# Patient Record
Sex: Male | Born: 1960 | Race: Black or African American | Hispanic: No | Marital: Married | Smoking: Former smoker
Health system: Southern US, Community
[De-identification: ages and names within clinical notes are randomized; demographics above are authoritative.]

## PROBLEM LIST (undated history)

## (undated) DIAGNOSIS — T8859XA Other complications of anesthesia, initial encounter: Secondary | ICD-10-CM

## (undated) DIAGNOSIS — T4145XA Adverse effect of unspecified anesthetic, initial encounter: Secondary | ICD-10-CM

## (undated) DIAGNOSIS — E119 Type 2 diabetes mellitus without complications: Secondary | ICD-10-CM

## (undated) HISTORY — PX: TONSILLECTOMY: SUR1361

## (undated) HISTORY — PX: FRACTURE SURGERY: SHX138

## (undated) HISTORY — PX: INGUINAL HERNIA REPAIR: SUR1180

---

## 2008-10-31 ENCOUNTER — Emergency Department (HOSPITAL_COMMUNITY): Admission: EM | Admit: 2008-10-31 | Discharge: 2008-10-31 | Payer: Self-pay | Admitting: Emergency Medicine

## 2010-01-02 ENCOUNTER — Emergency Department (HOSPITAL_COMMUNITY): Admission: EM | Admit: 2010-01-02 | Discharge: 2010-01-02 | Payer: Self-pay | Admitting: Emergency Medicine

## 2013-01-28 ENCOUNTER — Encounter (HOSPITAL_BASED_OUTPATIENT_CLINIC_OR_DEPARTMENT_OTHER): Payer: Self-pay | Admitting: *Deleted

## 2013-01-28 ENCOUNTER — Emergency Department (HOSPITAL_BASED_OUTPATIENT_CLINIC_OR_DEPARTMENT_OTHER)
Admission: EM | Admit: 2013-01-28 | Discharge: 2013-01-28 | Disposition: A | Discharge status: Home / Self Care | Payer: Worker's Compensation | Attending: Emergency Medicine | Admitting: Emergency Medicine

## 2013-01-28 DIAGNOSIS — M109 Gout, unspecified: Secondary | ICD-10-CM | POA: Insufficient documentation

## 2013-01-28 DIAGNOSIS — F17200 Nicotine dependence, unspecified, uncomplicated: Secondary | ICD-10-CM | POA: Insufficient documentation

## 2013-01-28 DIAGNOSIS — H538 Other visual disturbances: Secondary | ICD-10-CM | POA: Insufficient documentation

## 2013-01-28 DIAGNOSIS — R110 Nausea: Secondary | ICD-10-CM | POA: Insufficient documentation

## 2013-01-28 DIAGNOSIS — Z77098 Contact with and (suspected) exposure to other hazardous, chiefly nonmedicinal, chemicals: Secondary | ICD-10-CM
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
## 2013-01-28 NOTE — ED Notes (Signed)
Visual acuity right eye 20/30  left eye 20/30.

## 2016-08-26 ENCOUNTER — Emergency Department (HOSPITAL_COMMUNITY): Payer: BLUE CROSS/BLUE SHIELD

## 2016-08-26 ENCOUNTER — Emergency Department (HOSPITAL_COMMUNITY): Payer: BLUE CROSS/BLUE SHIELD | Admitting: Certified Registered Nurse Anesthetist

## 2016-08-26 ENCOUNTER — Encounter (HOSPITAL_COMMUNITY): Admission: EM | Disposition: A | Discharge status: Rehabilitation Facility | Payer: Self-pay | Source: Home / Self Care

## 2016-08-26 ENCOUNTER — Encounter (HOSPITAL_COMMUNITY): Payer: Self-pay | Admitting: Emergency Medicine

## 2016-08-26 ENCOUNTER — Inpatient Hospital Stay (HOSPITAL_COMMUNITY)
Admission: EM | Admit: 2016-08-26 | Discharge: 2016-09-01 | DRG: 493 | Disposition: A | Discharge status: Rehabilitation Facility | Payer: BLUE CROSS/BLUE SHIELD | Attending: Orthopedic Surgery | Admitting: Orthopedic Surgery

## 2016-08-26 ENCOUNTER — Inpatient Hospital Stay (HOSPITAL_COMMUNITY): Payer: BLUE CROSS/BLUE SHIELD

## 2016-08-26 DIAGNOSIS — Z23 Encounter for immunization: Secondary | ICD-10-CM | POA: Diagnosis not present

## 2016-08-26 DIAGNOSIS — S82891S Other fracture of right lower leg, sequela: Secondary | ICD-10-CM | POA: Diagnosis not present

## 2016-08-26 DIAGNOSIS — F17210 Nicotine dependence, cigarettes, uncomplicated: Secondary | ICD-10-CM | POA: Diagnosis present

## 2016-08-26 DIAGNOSIS — S82891A Other fracture of right lower leg, initial encounter for closed fracture: Secondary | ICD-10-CM | POA: Diagnosis present

## 2016-08-26 DIAGNOSIS — S32049A Unspecified fracture of fourth lumbar vertebra, initial encounter for closed fracture: Secondary | ICD-10-CM | POA: Diagnosis present

## 2016-08-26 DIAGNOSIS — S82852A Displaced trimalleolar fracture of left lower leg, initial encounter for closed fracture: Secondary | ICD-10-CM | POA: Diagnosis present

## 2016-08-26 DIAGNOSIS — M542 Cervicalgia: Secondary | ICD-10-CM | POA: Diagnosis present

## 2016-08-26 DIAGNOSIS — S2249XA Multiple fractures of ribs, unspecified side, initial encounter for closed fracture: Secondary | ICD-10-CM

## 2016-08-26 DIAGNOSIS — S8261XA Displaced fracture of lateral malleolus of right fibula, initial encounter for closed fracture: Secondary | ICD-10-CM | POA: Diagnosis present

## 2016-08-26 DIAGNOSIS — S82892S Other fracture of left lower leg, sequela: Secondary | ICD-10-CM | POA: Diagnosis not present

## 2016-08-26 DIAGNOSIS — Y907 Blood alcohol level of 200-239 mg/100 ml: Secondary | ICD-10-CM | POA: Diagnosis present

## 2016-08-26 DIAGNOSIS — S32039A Unspecified fracture of third lumbar vertebra, initial encounter for closed fracture: Secondary | ICD-10-CM | POA: Diagnosis present

## 2016-08-26 DIAGNOSIS — F10929 Alcohol use, unspecified with intoxication, unspecified: Secondary | ICD-10-CM | POA: Diagnosis present

## 2016-08-26 DIAGNOSIS — S8251XA Displaced fracture of medial malleolus of right tibia, initial encounter for closed fracture: Secondary | ICD-10-CM | POA: Diagnosis present

## 2016-08-26 DIAGNOSIS — F10129 Alcohol abuse with intoxication, unspecified: Secondary | ICD-10-CM | POA: Diagnosis present

## 2016-08-26 DIAGNOSIS — S2243XA Multiple fractures of ribs, bilateral, initial encounter for closed fracture: Secondary | ICD-10-CM | POA: Diagnosis present

## 2016-08-26 DIAGNOSIS — S81811A Laceration without foreign body, right lower leg, initial encounter: Secondary | ICD-10-CM | POA: Diagnosis present

## 2016-08-26 DIAGNOSIS — S32008S Other fracture of unspecified lumbar vertebra, sequela: Secondary | ICD-10-CM | POA: Diagnosis not present

## 2016-08-26 DIAGNOSIS — E119 Type 2 diabetes mellitus without complications: Secondary | ICD-10-CM | POA: Diagnosis present

## 2016-08-26 DIAGNOSIS — S82892A Other fracture of left lower leg, initial encounter for closed fracture: Secondary | ICD-10-CM

## 2016-08-26 DIAGNOSIS — S32009A Unspecified fracture of unspecified lumbar vertebra, initial encounter for closed fracture: Secondary | ICD-10-CM | POA: Diagnosis present

## 2016-08-26 DIAGNOSIS — S2243XS Multiple fractures of ribs, bilateral, sequela: Secondary | ICD-10-CM | POA: Diagnosis not present

## 2016-08-26 DIAGNOSIS — Y92488 Other paved roadways as the place of occurrence of the external cause: Secondary | ICD-10-CM | POA: Diagnosis not present

## 2016-08-26 HISTORY — PX: PERCUTANEOUS PINNING: SHX2209

## 2016-08-26 LAB — CBC
HEMATOCRIT: 44.5 % (ref 39.0–52.0)
Hemoglobin: 16.3 g/dL (ref 13.0–17.0)
MCH: 32.7 pg (ref 26.0–34.0)
MCHC: 36.6 g/dL — AB (ref 30.0–36.0)
MCV: 89.2 fL (ref 78.0–100.0)
Platelets: 268 10*3/uL (ref 150–400)
RBC: 4.99 MIL/uL (ref 4.22–5.81)
RDW: 14.2 % (ref 11.5–15.5)
WBC: 16.3 10*3/uL — AB (ref 4.0–10.5)

## 2016-08-26 LAB — ETHANOL: ALCOHOL ETHYL (B): 219 mg/dL — AB (ref <5)

## 2016-08-26 LAB — BASIC METABOLIC PANEL
Anion gap: 10 (ref 5–15)
BUN: 8 mg/dL (ref 6–20)
CHLORIDE: 107 mmol/L (ref 101–111)
CO2: 25 mmol/L (ref 22–32)
CREATININE: 1.12 mg/dL (ref 0.61–1.24)
Calcium: 8.9 mg/dL (ref 8.9–10.3)
GFR calc Af Amer: >60 mL/min (ref >60)
GFR calc non Af Amer: >60 mL/min (ref >60)
GLUCOSE: 154 mg/dL — AB (ref 65–99)
POTASSIUM: 3.6 mmol/L (ref 3.5–5.1)
Sodium: 142 mmol/L (ref 135–145)

## 2016-08-26 LAB — GLUCOSE, CAPILLARY
Glucose-Capillary: 123 mg/dL — ABNORMAL HIGH (ref 65–99)
Glucose-Capillary: 124 mg/dL — ABNORMAL HIGH (ref 65–99)
Glucose-Capillary: 96 mg/dL (ref 65–99)

## 2016-08-26 SURGERY — PINNING, EXTREMITY, PERCUTANEOUS
Anesthesia: General | Site: Ankle | Laterality: Bilateral

## 2016-08-26 MED ORDER — OXYCODONE HCL 5 MG PO TABS
10.0000 mg | ORAL_TABLET | ORAL | Status: DC | PRN
  Filled 2016-08-26 (×3): qty 2

## 2016-08-26 MED ORDER — HYDROMORPHONE HCL 1 MG/ML IJ SOLN
INTRAMUSCULAR | Status: AC
  Administered 2016-08-26: 0.5 mg via INTRAVENOUS
  Filled 2016-08-26: qty 1

## 2016-08-26 MED ORDER — CEFAZOLIN SODIUM-DEXTROSE 2-4 GM/100ML-% IV SOLN
INTRAVENOUS | Status: AC
  Filled 2016-08-26: qty 100

## 2016-08-26 MED ORDER — LIDOCAINE 2% (20 MG/ML) 5 ML SYRINGE
INTRAMUSCULAR | Status: AC
Start: 2016-08-26 — End: 2016-08-26
  Filled 2016-08-26: qty 5

## 2016-08-26 MED ORDER — OXYCODONE HCL 5 MG PO TABS: 5.0000 mg | ORAL_TABLET | ORAL | Status: DC | PRN

## 2016-08-26 MED ORDER — FENTANYL CITRATE (PF) 100 MCG/2ML IJ SOLN
INTRAMUSCULAR | Status: DC | PRN
Start: 2016-08-26 — End: 2016-08-26
  Administered 2016-08-26 (×4): 50 ug via INTRAVENOUS

## 2016-08-26 MED ORDER — SUCCINYLCHOLINE CHLORIDE 200 MG/10ML IV SOSY
PREFILLED_SYRINGE | INTRAVENOUS | Status: DC | PRN
  Administered 2016-08-26: 160 mg via INTRAVENOUS

## 2016-08-26 MED ORDER — ONDANSETRON HCL 4 MG PO TABS: 4.0000 mg | ORAL_TABLET | Freq: Four times a day (QID) | ORAL | Status: DC | PRN

## 2016-08-26 MED ORDER — FENTANYL CITRATE (PF) 100 MCG/2ML IJ SOLN
INTRAMUSCULAR | Status: AC
  Filled 2016-08-26: qty 4

## 2016-08-26 MED ORDER — LIDOCAINE 2% (20 MG/ML) 5 ML SYRINGE
INTRAMUSCULAR | Status: DC | PRN
Start: 2016-08-26 — End: 2016-08-26
  Administered 2016-08-26: 40 mg via INTRAVENOUS
  Administered 2016-08-26: 60 mg via INTRAVENOUS

## 2016-08-26 MED ORDER — ENOXAPARIN SODIUM 40 MG/0.4ML ~~LOC~~ SOLN: 40.0000 mg | SUBCUTANEOUS | Status: DC

## 2016-08-26 MED ORDER — 0.9 % SODIUM CHLORIDE (POUR BTL) OPTIME
TOPICAL | Status: DC | PRN
  Administered 2016-08-26: 1000 mL

## 2016-08-26 MED ORDER — SUCCINYLCHOLINE CHLORIDE 200 MG/10ML IV SOSY
PREFILLED_SYRINGE | INTRAVENOUS | Status: AC
  Filled 2016-08-26: qty 10

## 2016-08-26 MED ORDER — SODIUM CHLORIDE 0.9 % IV BOLUS (SEPSIS)
500.0000 mL | Freq: Once | INTRAVENOUS | Status: AC
Start: 2016-08-26 — End: 2016-08-26
  Administered 2016-08-26: 500 mL via INTRAVENOUS

## 2016-08-26 MED ORDER — SUGAMMADEX SODIUM 500 MG/5ML IV SOLN
INTRAVENOUS | Status: DC | PRN
  Administered 2016-08-26: 240 mg via INTRAVENOUS

## 2016-08-26 MED ORDER — ONDANSETRON HCL 4 MG/2ML IJ SOLN: 4.0000 mg | Freq: Four times a day (QID) | INTRAMUSCULAR | Status: DC | PRN

## 2016-08-26 MED ORDER — PHENYLEPHRINE 40 MCG/ML (10ML) SYRINGE FOR IV PUSH (FOR BLOOD PRESSURE SUPPORT)
PREFILLED_SYRINGE | INTRAVENOUS | Status: DC | PRN
Start: 2016-08-26 — End: 2016-08-26
  Administered 2016-08-26: 80 ug via INTRAVENOUS
  Administered 2016-08-26: 120 ug via INTRAVENOUS
  Administered 2016-08-26: 80 ug via INTRAVENOUS

## 2016-08-26 MED ORDER — POTASSIUM CHLORIDE IN NACL 20-0.9 MEQ/L-% IV SOLN
INTRAVENOUS | Status: DC
  Administered 2016-08-27: 05:00:00 via INTRAVENOUS
  Filled 2016-08-26 (×2): qty 1000

## 2016-08-26 MED ORDER — ENOXAPARIN SODIUM 40 MG/0.4ML ~~LOC~~ SOLN
40.0000 mg | SUBCUTANEOUS | Status: DC
  Administered 2016-08-27: 40 mg via SUBCUTANEOUS
  Filled 2016-08-26: qty 0.4

## 2016-08-26 MED ORDER — CEFAZOLIN SODIUM 1 G IJ SOLR
INTRAMUSCULAR | Status: AC
  Filled 2016-08-26: qty 10

## 2016-08-26 MED ORDER — PROPOFOL 10 MG/ML IV BOLUS
INTRAVENOUS | Status: DC | PRN
  Administered 2016-08-26: 180 mg via INTRAVENOUS

## 2016-08-26 MED ORDER — DEXTROSE 5 % IV SOLN
INTRAVENOUS | Status: DC | PRN
  Administered 2016-08-26: 25 ug/min via INTRAVENOUS

## 2016-08-26 MED ORDER — LACTATED RINGERS IV SOLN
INTRAVENOUS | Status: DC | PRN
  Administered 2016-08-26 (×2): via INTRAVENOUS

## 2016-08-26 MED ORDER — PROMETHAZINE HCL 25 MG/ML IJ SOLN: 6.2500 mg | INTRAMUSCULAR | Status: DC | PRN

## 2016-08-26 MED ORDER — CEFAZOLIN SODIUM 1 G IJ SOLR
INTRAMUSCULAR | Status: DC | PRN
  Administered 2016-08-26: 1 g via INTRAMUSCULAR

## 2016-08-26 MED ORDER — TETANUS-DIPHTHERIA TOXOIDS TD 5-2 LFU IM INJ
0.5000 mL | INJECTION | Freq: Once | INTRAMUSCULAR | Status: AC
  Administered 2016-08-26: 0.5 mL via INTRAMUSCULAR
  Filled 2016-08-26: qty 0.5

## 2016-08-26 MED ORDER — CEFAZOLIN SODIUM-DEXTROSE 2-4 GM/100ML-% IV SOLN
2.0000 g | INTRAVENOUS | Status: AC
  Administered 2016-08-26: 2 g via INTRAVENOUS

## 2016-08-26 MED ORDER — LIDOCAINE HCL 2 % IJ SOLN
5.0000 mL | Freq: Once | INTRAMUSCULAR | Status: AC
  Administered 2016-08-26: 100 mg via INTRADERMAL
  Filled 2016-08-26: qty 20

## 2016-08-26 MED ORDER — ESMOLOL HCL 100 MG/10ML IV SOLN
INTRAVENOUS | Status: DC | PRN
  Administered 2016-08-26: 30 mg via INTRAVENOUS

## 2016-08-26 MED ORDER — KETOROLAC TROMETHAMINE 30 MG/ML IJ SOLN
INTRAMUSCULAR | Status: AC
  Filled 2016-08-26: qty 1

## 2016-08-26 MED ORDER — CHLORHEXIDINE GLUCONATE 4 % EX LIQD
60.0000 mL | Freq: Once | CUTANEOUS | Status: DC
  Filled 2016-08-26: qty 60

## 2016-08-26 MED ORDER — PROPOFOL 10 MG/ML IV BOLUS
INTRAVENOUS | Status: AC
  Filled 2016-08-26: qty 20

## 2016-08-26 MED ORDER — INSULIN ASPART 100 UNIT/ML ~~LOC~~ SOLN
0.0000 [IU] | Freq: Three times a day (TID) | SUBCUTANEOUS | Status: DC
  Administered 2016-08-26 – 2016-08-27 (×4): 2 [IU] via SUBCUTANEOUS
  Administered 2016-08-28 – 2016-08-30 (×8): 3 [IU] via SUBCUTANEOUS
  Administered 2016-08-30: 5 [IU] via SUBCUTANEOUS
  Administered 2016-08-31 (×2): 3 [IU] via SUBCUTANEOUS
  Administered 2016-08-31 – 2016-09-01 (×3): 2 [IU] via SUBCUTANEOUS

## 2016-08-26 MED ORDER — ROCURONIUM BROMIDE 10 MG/ML (PF) SYRINGE
PREFILLED_SYRINGE | INTRAVENOUS | Status: DC | PRN
Start: 2016-08-26 — End: 2016-08-26
  Administered 2016-08-26: 40 mg via INTRAVENOUS
  Administered 2016-08-26: 20 mg via INTRAVENOUS

## 2016-08-26 MED ORDER — SUGAMMADEX SODIUM 500 MG/5ML IV SOLN
INTRAVENOUS | Status: AC
  Filled 2016-08-26: qty 5

## 2016-08-26 MED ORDER — EPHEDRINE SULFATE-NACL 50-0.9 MG/10ML-% IV SOSY
PREFILLED_SYRINGE | INTRAVENOUS | Status: DC | PRN
  Administered 2016-08-26: 10 mg via INTRAVENOUS

## 2016-08-26 MED ORDER — HYDROMORPHONE HCL 1 MG/ML IJ SOLN
0.2500 mg | INTRAMUSCULAR | Status: DC | PRN
  Administered 2016-08-26 (×2): 0.5 mg via INTRAVENOUS

## 2016-08-26 MED ORDER — ONDANSETRON HCL 4 MG/2ML IJ SOLN
INTRAMUSCULAR | Status: DC | PRN
  Administered 2016-08-26: 4 mg via INTRAVENOUS

## 2016-08-26 MED ORDER — ONDANSETRON HCL 4 MG/2ML IJ SOLN
INTRAMUSCULAR | Status: AC
  Filled 2016-08-26: qty 2

## 2016-08-26 MED ORDER — KETOROLAC TROMETHAMINE 30 MG/ML IJ SOLN
30.0000 mg | Freq: Once | INTRAMUSCULAR | Status: AC | PRN
  Administered 2016-08-26: 30 mg via INTRAVENOUS

## 2016-08-26 MED ORDER — MIDAZOLAM HCL 2 MG/2ML IJ SOLN
INTRAMUSCULAR | Status: AC
  Filled 2016-08-26: qty 2

## 2016-08-26 MED ORDER — HYDROMORPHONE HCL 1 MG/ML IJ SOLN
1.0000 mg | INTRAMUSCULAR | Status: DC | PRN
  Administered 2016-08-26 – 2016-08-28 (×6): 1 mg via INTRAVENOUS
  Filled 2016-08-26 (×6): qty 1

## 2016-08-26 MED ORDER — IOPAMIDOL (ISOVUE-300) INJECTION 61%
INTRAVENOUS | Status: AC
  Administered 2016-08-26: 100 mL
  Filled 2016-08-26: qty 100

## 2016-08-26 MED ORDER — POVIDONE-IODINE 10 % EX SWAB: 2.0000 "application " | Freq: Once | CUTANEOUS | Status: DC

## 2016-08-26 SURGICAL SUPPLY — 62 items
BANDAGE ACE 4X5 VEL STRL LF (GAUZE/BANDAGES/DRESSINGS) IMPLANT
BANDAGE ELASTIC 3 VELCRO ST LF (GAUZE/BANDAGES/DRESSINGS) IMPLANT
BAR GLASS FIBER EXFX 11X150 (EXFIX) ×3 IMPLANT
BAR GLASS FIBER EXFX 11X200 (EXFIX) ×3 IMPLANT
BAR GLASS FIBER EXFX 11X350 (EXFIX) ×6 IMPLANT
BENZOIN TINCTURE PRP APPL 2/3 (GAUZE/BANDAGES/DRESSINGS) IMPLANT
BIT DRILL 2.7XCANN QCK CNCT (BIT) ×1 IMPLANT
BIT DRILL CANN 2.7 (BIT) ×1
BIT DRILL CANN 2.7MM (BIT) ×1
BIT DRL 2.7XCANN QCK CNCT (BIT) ×1
BLADE SURG ROTATE 9660 (MISCELLANEOUS) IMPLANT
BNDG GAUZE ELAST 4 BULKY (GAUZE/BANDAGES/DRESSINGS) IMPLANT
CLAMP BLUE BAR TO BAR (EXFIX) ×6 IMPLANT
CLAMP BLUE BAR TO PIN (EXFIX) ×12 IMPLANT
CLOSURE WOUND 1/2 X4 (GAUZE/BANDAGES/DRESSINGS)
COVER SURGICAL LIGHT HANDLE (MISCELLANEOUS) ×3 IMPLANT
CUFF TOURNIQUET SINGLE 18IN (TOURNIQUET CUFF) IMPLANT
CUFF TOURNIQUET SINGLE 24IN (TOURNIQUET CUFF) IMPLANT
DRAPE BILATERAL SPLIT (DRAPES) ×3 IMPLANT
DRAPE ORTHO SPLIT 87X125 STRL (DRAPES) ×9 IMPLANT
DRSG EMULSION OIL 3X3 NADH (GAUZE/BANDAGES/DRESSINGS) IMPLANT
DURAPREP 26ML APPLICATOR (WOUND CARE) ×6 IMPLANT
GAUZE SPONGE 4X4 12PLY STRL (GAUZE/BANDAGES/DRESSINGS) ×3 IMPLANT
GAUZE XEROFORM 1X8 LF (GAUZE/BANDAGES/DRESSINGS) IMPLANT
GAUZE XEROFORM 5X9 LF (GAUZE/BANDAGES/DRESSINGS) ×3 IMPLANT
GLOVE BIOGEL PI IND STRL 7.5 (GLOVE) IMPLANT
GLOVE BIOGEL PI IND STRL 8.5 (GLOVE) ×2 IMPLANT
GLOVE BIOGEL PI INDICATOR 7.5 (GLOVE)
GLOVE BIOGEL PI INDICATOR 8.5 (GLOVE) ×4
GLOVE SURG ORTHO 7.0 STRL STRW (GLOVE) IMPLANT
GLOVE SURG ORTHO 8.0 STRL STRW (GLOVE) ×9 IMPLANT
GOWN STRL REUS W/ TWL LRG LVL3 (GOWN DISPOSABLE) ×1 IMPLANT
GOWN STRL REUS W/TWL 2XL LVL3 (GOWN DISPOSABLE) ×6 IMPLANT
GOWN STRL REUS W/TWL LRG LVL3 (GOWN DISPOSABLE) ×2
HALF PIN 3MM (EXFIX) ×3 IMPLANT
HALF PIN 4MM (EXFIX) ×3 IMPLANT
K-WIRE PT THD 1.6X150 (WIRE) ×6
KIT BASIN OR (CUSTOM PROCEDURE TRAY) ×3 IMPLANT
KIT ROOM TURNOVER OR (KITS) ×3 IMPLANT
KWIRE PT THD 1.6X150 (WIRE) ×2 IMPLANT
MANIFOLD NEPTUNE II (INSTRUMENTS) ×3 IMPLANT
NS IRRIG 1000ML POUR BTL (IV SOLUTION) ×3 IMPLANT
PACK ORTHO EXTREMITY (CUSTOM PROCEDURE TRAY) ×3 IMPLANT
PAD ABD 8X10 STRL (GAUZE/BANDAGES/DRESSINGS) ×18 IMPLANT
PAD ARMBOARD 7.5X6 YLW CONV (MISCELLANEOUS) ×9 IMPLANT
PAD CAST 4YDX4 CTTN HI CHSV (CAST SUPPLIES) ×2 IMPLANT
PADDING CAST COTTON 4X4 STRL (CAST SUPPLIES) ×4
PADDING CAST COTTON 6X4 STRL (CAST SUPPLIES) ×6 IMPLANT
PIN CLAMP 2BAR 75MM BLUE (EXFIX) ×3 IMPLANT
PIN HALF YELLOW 5X160X35 (EXFIX) ×6 IMPLANT
PIN TRANSFIXING 5.0 (EXFIX) ×3 IMPLANT
SCREW CANN .5 THRD RVRS CUT (Screw) ×2 IMPLANT
SCREW CANNULATED 4.0X44MM (Screw) ×4 IMPLANT
STRIP CLOSURE SKIN 1/2X4 (GAUZE/BANDAGES/DRESSINGS) IMPLANT
SUT ETHILON 3 0 PS 1 (SUTURE) ×3 IMPLANT
SUT ETHILON 4 0 P 3 18 (SUTURE) IMPLANT
SUT ETHILON 5 0 P 3 18 (SUTURE)
SUT NYLON ETHILON 5-0 P-3 1X18 (SUTURE) IMPLANT
SUT PROLENE 4 0 P 3 18 (SUTURE) IMPLANT
TOWEL OR 17X24 6PK STRL BLUE (TOWEL DISPOSABLE) ×3 IMPLANT
TOWEL OR 17X26 10 PK STRL BLUE (TOWEL DISPOSABLE) ×3 IMPLANT
WATER STERILE IRR 1000ML POUR (IV SOLUTION) ×3 IMPLANT
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
## 2016-08-26 NOTE — ED Notes (Signed)
Patient transported to CT 
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
## 2016-08-26 NOTE — ED Notes (Signed)
Pt states he does not have any pain in head.
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
## 2016-08-26 NOTE — Progress Notes (Signed)
Orthopedic Tech Progress Note Patient Details:  [name] DiceRobert [name] 1961/02/16 [id number]  Ortho Devices Type of Ortho Device: Ace wrap, Post (short leg) splint, Stirrup splint Ortho Device/Splint Location: lle/rle Ortho Device/Splint Interventions: Application   [name] [name] 08/26/2016, 8:31 AM

## 2016-08-26 NOTE — ED Notes (Signed)
Made two attempts at IV access, without success. Will place order for IV team d/t difficult start.

## 2016-08-26 NOTE — ED Notes (Signed)
EDP at bedside  

## 2016-08-26 NOTE — Anesthesia Postprocedure Evaluation (Signed)
Anesthesia Post Note  Patient: [name] DiceRobert [name]  Procedure(s) Performed: Procedure(s) (LRB): LEFT ANKLE EXTERNAL FIXATION / RIGHT ANKLE PERCUTANEOUS SCREW FIXATION (Bilateral)  Patient location during evaluation: PACU Anesthesia Type: General Level of consciousness: awake and alert Pain management: pain level controlled Vital Signs Assessment: post-procedure vital signs reviewed and stable Respiratory status: spontaneous breathing, nonlabored ventilation, respiratory function stable and patient connected to nasal cannula oxygen Cardiovascular status: blood pressure returned to baseline and stable Postop Assessment: no signs of nausea or vomiting Anesthetic complications: no    Last Vitals:  Vitals:   08/26/16 1232 08/26/16 1300  BP: (!) 95/58 (!) (P) 104/57  Pulse: (!) 111   Resp: (!) 23   Temp: 36.6 C     Last Pain:  Vitals:   08/26/16 1020  PainSc: 10-Worst pain ever                 [name] [name] S

## 2016-08-26 NOTE — ED Notes (Signed)
IV Team at bedside 

## 2016-08-26 NOTE — ED Notes (Signed)
Ortho paged. 

## 2016-08-26 NOTE — Progress Notes (Signed)
Orthopedic Tech Progress Note Patient Details:  [name] DiceRobert [name] 1961-01-16 [id number]  Ortho Devices Type of Ortho Device: CAM walker Ortho Device/Splint Location: lle/rle Ortho Device/Splint Interventions: Ordered As ordered by Dr. Domenic MorasNanavati  [name] [name] 08/26/2016, 12:28 PM
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
## 2016-08-26 NOTE — Transfer of Care (Signed)
Immediate Anesthesia Transfer of Care Note  Patient: [name] DiceRobert Bega  Procedure(s) Performed: Procedure(s): LEFT ANKLE EXTERNAL FIXATION / RIGHT ANKLE PERCUTANEOUS SCREW FIXATION (Bilateral)  Patient Location: PACU  Anesthesia Type:General  Level of Consciousness: awake, alert , oriented, patient cooperative and responds to stimulation  Airway & Oxygen Therapy: Patient spontaneously breathing connected to face mask oxygen  Post-op Assessment: Report given to RN and Post -op Vital signs reviewed and stable  Post vital signs: Reviewed and stable  Last Vitals:  Vitals:   08/26/16 0415 08/26/16 1009  BP: 107/64 131/74  Pulse: 108 100  Resp:  (!) 2    Last Pain:  Vitals:   08/26/16 1020  PainSc: 10-Worst pain ever         Complications: No apparent anesthesia complications
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
## 2016-08-26 NOTE — Progress Notes (Signed)
Dr. Okey Dupreose updated on PACU stay. Cleared to tf to room 5N20

## 2016-08-26 NOTE — Progress Notes (Signed)
Orthopedic Tech Progress Note Patient Details:  [name] DiceRobert Lim 02-Aug-1961 [id number]  Patient ID: [name] Diceobert Griess, male   DOB: 02-Aug-1961, 55 y.o.   MRN: [id number]   [name], [name] 08/26/2016, 8:31 AM Crutch order to be cancelled

## 2016-08-26 NOTE — Anesthesia Procedure Notes (Signed)
Procedure Name: Intubation Date/Time: 08/26/2016 10:57 AM Performed by: Annabelle HarmanSMITH, Kilah Drahos A Pre-anesthesia Checklist: Patient identified, Emergency Drugs available, Suction available and Patient being monitored Patient Re-evaluated:Patient Re-evaluated prior to inductionOxygen Delivery Method: Circle system utilized Preoxygenation: Pre-oxygenation with 100% oxygen Intubation Type: IV induction Ventilation: Oral airway inserted - appropriate to patient size, Mask ventilation with difficulty and Two handed mask ventilation required Laryngoscope Size: Glidescope and 4 Grade View: Grade I Tube type: Oral Tube size: 7.5 mm Number of attempts: 1 Airway Equipment and Method: Video-laryngoscopy and Rigid stylet Placement Confirmation: ETT inserted through vocal cords under direct vision,  positive ETCO2 and breath sounds checked- equal and bilateral Secured at: 24 cm Tube secured with: Tape Dental Injury: Teeth and Oropharynx as per pre-operative assessment  Difficulty Due To: Difficulty was anticipated, Difficult Airway- due to large tongue, Difficult Airway- due to reduced neck mobility and Difficult Airway- due to cervical collar Comments: C-spine cleared per CT scan.  Head and neck remained in neutral alignment throughout.  Glidescope 4 used.  Grade 1 view.  EBBS and VSS.

## 2016-08-26 NOTE — Anesthesia Preprocedure Evaluation (Signed)
Anesthesia Evaluation  Patient identified by MRN, date of birth, ID band Patient awake    Reviewed: Allergy & Precautions, NPO status , Patient's Chart, lab work & pertinent test results  Airway Mallampati: II  TM Distance: >3 FB Neck ROM: Full    Dental no notable dental hx.    Pulmonary neg pulmonary ROS, Current Smoker,    Pulmonary exam normal breath sounds clear to auscultation       Cardiovascular negative cardio ROS Normal cardiovascular exam Rhythm:Regular Rate:Normal     Neuro/Psych negative neurological ROS  negative psych ROS   GI/Hepatic negative GI ROS, Neg liver ROS,   Endo/Other  diabetesMorbid obesity  Renal/GU negative Renal ROS  negative genitourinary   Musculoskeletal negative musculoskeletal ROS (+)   Abdominal   Peds negative pediatric ROS (+)  Hematology negative hematology ROS (+)   Anesthesia Other Findings   Reproductive/Obstetrics negative OB ROS                             Anesthesia Physical Anesthesia Plan  ASA: III  Anesthesia Plan: General   Post-op Pain Management:    Induction: Intravenous  Airway Management Planned: LMA and Oral ETT  Additional Equipment:   Intra-op Plan:   Post-operative Plan: Extubation in OR  Informed Consent: I have reviewed the patients History and Physical, chart, labs and discussed the procedure including the risks, benefits and alternatives for the proposed anesthesia with the patient or authorized representative who has indicated his/her understanding and acceptance.   Dental advisory given  Plan Discussed with: CRNA and Surgeon  Anesthesia Plan Comments:         Anesthesia Quick Evaluation

## 2016-08-26 NOTE — Op Note (Signed)
Dictation Number:  [id number]

## 2016-08-27 LAB — HEMOGLOBIN A1C
Hgb A1c MFr Bld: 6.3 % — ABNORMAL HIGH (ref 4.8–5.6)
Mean Plasma Glucose: 134 mg/dL

## 2016-08-27 LAB — COMPREHENSIVE METABOLIC PANEL
ALBUMIN: 3.1 g/dL — AB (ref 3.5–5.0)
ALK PHOS: 48 U/L (ref 38–126)
ALT: 29 U/L (ref 17–63)
ANION GAP: 5 (ref 5–15)
AST: 49 U/L — ABNORMAL HIGH (ref 15–41)
BUN: 6 mg/dL (ref 6–20)
CALCIUM: 8.1 mg/dL — AB (ref 8.9–10.3)
CHLORIDE: 108 mmol/L (ref 101–111)
CO2: 26 mmol/L (ref 22–32)
Creatinine, Ser: 0.75 mg/dL (ref 0.61–1.24)
GFR calc Af Amer: >60 mL/min (ref >60)
GFR calc non Af Amer: >60 mL/min (ref >60)
GLUCOSE: 127 mg/dL — AB (ref 65–99)
POTASSIUM: 4.3 mmol/L (ref 3.5–5.1)
Sodium: 139 mmol/L (ref 135–145)
Total Bilirubin: 1.1 mg/dL (ref 0.3–1.2)
Total Protein: 5.9 g/dL — ABNORMAL LOW (ref 6.5–8.1)

## 2016-08-27 LAB — GLUCOSE, CAPILLARY
GLUCOSE-CAPILLARY: 144 mg/dL — AB (ref 65–99)
GLUCOSE-CAPILLARY: 123 mg/dL — AB (ref 65–99)
GLUCOSE-CAPILLARY: 148 mg/dL — AB (ref 65–99)
Glucose-Capillary: 139 mg/dL — ABNORMAL HIGH (ref 65–99)

## 2016-08-27 LAB — CBC
HEMATOCRIT: 38.5 % — AB (ref 39.0–52.0)
HEMOGLOBIN: 13.8 g/dL (ref 13.0–17.0)
MCH: 31.7 pg (ref 26.0–34.0)
MCHC: 35.8 g/dL (ref 30.0–36.0)
MCV: 88.3 fL (ref 78.0–100.0)
Platelets: 221 10*3/uL (ref 150–400)
RBC: 4.36 MIL/uL (ref 4.22–5.81)
RDW: 14.1 % (ref 11.5–15.5)
WBC: 15.6 10*3/uL — AB (ref 4.0–10.5)

## 2016-08-27 MED ORDER — SODIUM CHLORIDE 0.9% FLUSH: 10.0000 mL | INTRAVENOUS | Status: DC | PRN

## 2016-08-27 NOTE — Progress Notes (Addendum)
SPORTS MEDICINE AND JOINT REPLACEMENT  Georgena SpurlingStephen Lucey, MD    Laurier Nancyolby Odalys Win, PA-C [address], [place], [place]  [id number]                             [phone]   PROGRESS NOTE  Subjective:  negative for Chest Pain  negative for Shortness of Breath  negative for Nausea/Vomiting   negative for Calf Pain  negative for Bowel Movement   Tolerating Diet: yes         Patient reports pain as 5 on 0-10 scale.    Objective: Vital signs in last 24 hours:   Patient Vitals for the past 24 hrs:  BP Temp Temp src Pulse Resp SpO2  08/27/16 0449 (!) 142/66 98.8 F (37.1 C) Oral (!) 104 16 96 %  08/27/16 0028 (!) 127/58 99.2 F (37.3 C) Oral 100 16 100 %  08/26/16 2038 (!) 123/55 99.6 F (37.6 C) Oral 98 18 100 %  08/26/16 1400 (!) 104/53 99 F (37.2 C) - (!) 111 (!) 22 97 %  08/26/16 1315 (!) 110/52 98.1 F (36.7 C) - - - -  08/26/16 1300 (!) 104/57 - - - - -  08/26/16 1232 (!) 95/58 97.9 F (36.6 C) - (!) 111 (!) 23 100 %  08/26/16 1009 131/74 - - 100 (!) 2 95 %    @flow {1959:LAST@   Intake/Output from previous day:   09/16 0701 - 09/17 0700 In: 2470 [P.O.:120; I.V.:2350] Out: 1320 [Urine:1310]   Intake/Output this shift:   No intake/output data recorded.   Intake/Output      09/16 0701 - 09/17 0700 09/17 0701 - 09/18 0700   P.O. 120    I.V. 2350    Total Intake 2470     Urine 1310    Blood 10    Total Output 1320     Net +1150             LABORATORY DATA:  Recent Labs  08/26/16 0145 08/27/16 0308  WBC 16.3* 15.6*  HGB 16.3 13.8  HCT 44.5 38.5*  PLT 268 221    Recent Labs  08/26/16 0145 08/27/16 0308  NA 142 139  K 3.6 4.3  CL 107 108  CO2 25 26  BUN 8 6  CREATININE 1.12 0.75  GLUCOSE 154* 127*  CALCIUM 8.9 8.1*   No results found for: INR, PROTIME  Examination:  General appearance: alert, cooperative and no distress Extremities: extremities normal, atraumatic, no cyanosis or edema  Wound Exam: clean, dry, intact   Drainage:   None: wound tissue dry  Motor Exam: Quadriceps and Hamstrings Intact  Sensory Exam: Superficial Peroneal, Deep Peroneal and Tibial normal   Assessment:    1 Day Post-Op  Procedure(s) (LRB): LEFT ANKLE EXTERNAL FIXATION / RIGHT ANKLE PERCUTANEOUS SCREW FIXATION (Bilateral)  ADDITIONAL DIAGNOSIS:  Active Problems:   Bilateral ankle fractures    Plan: Physical Therapy as ordered Non Weight Bearing (NWB)  DVT Prophylaxis:  Lovenox  DISCHARGE PLAN: Home  Patient is resting comfortably 1 day post op. Dressings were checked and are in tact. Trauma service to continue to follow and determine D/C.         Guy SandiferColby Alan Keni Wafer 08/27/2016, 8:39 AM

## 2016-08-27 NOTE — Plan of Care (Signed)
Problem: Tissue Perfusion: Goal: Risk factors for ineffective tissue perfusion will decrease Outcome: Progressing No s/s of dvt  Problem: Bowel/Gastric: Goal: Will not experience complications related to bowel motility Outcome: Progressing No bowel issues

## 2016-08-27 NOTE — Progress Notes (Signed)
**Note [name]-Identified via Obfuscation** Trauma Service Note  Subjective: Pain in both legs, minimal chest pain, no neck pain, hungry  Objective: Vital signs in last 24 hours: Temp:  [97.9 F (36.6 C)-99.6 F (37.6 C)] 98.8 F (37.1 C) (09/17 0449) Pulse Rate:  [98-111] 104 (09/17 0449) Resp:  [2-23] 16 (09/17 0449) BP: (95-142)/(52-74) 142/66 (09/17 0449) SpO2:  [95 %-100 %] 96 % (09/17 0449) Last BM Date: 08/25/16  Intake/Output from previous day: 09/16 0701 - 09/17 0700 In: 2470 [P.O.:120; I.V.:2350] Out: 1320 [Urine:1310; Blood:10] Intake/Output this shift: Total I/O In: 240 [P.O.:240] Out: -   General: NAD  Lungs: CTAB, IS  Abd: soft, NT, ND, with obesity  Extremities: left calv in ex fix, right calf in ice packs  Neuro: AOx4, c spine clinically cleared and collar removed.  Lab Results: CBC   Recent Labs  08/26/16 0145 08/27/16 0308  WBC 16.3* 15.6*  HGB 16.3 13.8  HCT 44.5 38.5*  PLT 268 221   BMET  Recent Labs  08/26/16 0145 08/27/16 0308  NA 142 139  K 3.6 4.3  CL 107 108  CO2 25 26  GLUCOSE 154* 127*  BUN 8 6  CREATININE 1.12 0.75  CALCIUM 8.9 8.1*   PT/INR No results for input(s): LABPROT, INR in the last 72 hours. ABG No results for input(s): PHART, HCO3 in the last 72 hours.  Invalid input(s): PCO2, PO2  Studies/Results: Dg Ankle 2 Views Left  Result Date: 08/26/2016 CLINICAL DATA:  External fixation of left ankle. EXAM: LEFT ANKLE - 2 VIEW COMPARISON:  Films earlier today. FINDINGS: Intraoperative imaging with a C-arm demonstrates severely comminuted distal tibial and fibular fractures. After placement of external fixation device alignment appears improved. IMPRESSION: Improved alignment of the comminuted left ankle fractures after placement of external fixation device. Electronically Signed   By: Irish Lack M.D.   On: 08/26/2016 14:08   Dg Ankle 2 Views Right  Result Date: 08/26/2016 CLINICAL DATA:  ORIF of right ankle fracture EXAM: DG C-ARM 61-120 MIN;  RIGHT ANKLE - 2 VIEW COMPARISON:  Ankle films earlier today. FINDINGS: Intraoperative images show screw placement across the medial malleolar fracture with no significant displacement identified. The ankle mortise shows normal alignment. IMPRESSION: Status post ORIF with screw placement across the medial malleolar fracture. Electronically Signed   By: Irish Lack M.D.   On: 08/26/2016 14:05   Dg C-arm 1-60 Min  Result Date: 08/26/2016 CLINICAL DATA:  ORIF of right ankle fracture EXAM: DG C-ARM 61-120 MIN; RIGHT ANKLE - 2 VIEW COMPARISON:  Ankle films earlier today. FINDINGS: Intraoperative images show screw placement across the medial malleolar fracture with no significant displacement identified. The ankle mortise shows normal alignment. IMPRESSION: Status post ORIF with screw placement across the medial malleolar fracture. Electronically Signed   By: Irish Lack M.D.   On: 08/26/2016 14:05    Anti-infectives: Anti-infectives    Start     Dose/Rate Route Frequency Ordered Stop   08/26/16 1030  ceFAZolin (ANCEF) IVPB 2g/100 mL premix     2 g 200 mL/hr over 30 Minutes Intravenous On call to O.R. 08/26/16 1019 08/26/16 1137   08/26/16 1024  ceFAZolin (ANCEF) 2-4 GM/100ML-% IVPB    Comments:  Shireen Quan   : cabinet override      08/26/16 1024 08/26/16 1107      Medications Scheduled Meds: . enoxaparin (LOVENOX) injection  40 mg Subcutaneous Q24H  . insulin aspart  0-15 Units Subcutaneous TID WC   Continuous Infusions: . 0.9 %  NaCl with KCl 20 mEq / L 100 mL/hr at 08/27/16 0526   PRN Meds:.HYDROmorphone (DILAUDID) injection, ondansetron **OR** ondansetron (ZOFRAN) IV, oxyCODONE, oxyCODONE  Assessment/Plan: s/p Procedure(s): LEFT ANKLE EXTERNAL FIXATION / RIGHT ANKLE PERCUTANEOUS SCREW FIXATION -advance to reg diet -c spine cleared clinically after neg CT and resolution of cervicalgia -continue IS/pulm toilet for rib fx -f/u ortho recs   LOS: 1 day   [name] [name]  [facility] Trauma Surgeon [phone]--office [facility] 08/27/2016

## 2016-08-28 ENCOUNTER — Encounter (HOSPITAL_COMMUNITY): Payer: Self-pay

## 2016-08-28 DIAGNOSIS — S2243XA Multiple fractures of ribs, bilateral, initial encounter for closed fracture: Secondary | ICD-10-CM | POA: Diagnosis present

## 2016-08-28 DIAGNOSIS — S32009A Unspecified fracture of unspecified lumbar vertebra, initial encounter for closed fracture: Secondary | ICD-10-CM | POA: Diagnosis present

## 2016-08-28 DIAGNOSIS — E119 Type 2 diabetes mellitus without complications: Secondary | ICD-10-CM

## 2016-08-28 DIAGNOSIS — F10929 Alcohol use, unspecified with intoxication, unspecified: Secondary | ICD-10-CM | POA: Diagnosis present

## 2016-08-28 LAB — GLUCOSE, CAPILLARY
GLUCOSE-CAPILLARY: 157 mg/dL — AB (ref 65–99)
Glucose-Capillary: 159 mg/dL — ABNORMAL HIGH (ref 65–99)
Glucose-Capillary: 155 mg/dL — ABNORMAL HIGH (ref 65–99)
Glucose-Capillary: 160 mg/dL — ABNORMAL HIGH (ref 65–99)

## 2016-08-28 MED ORDER — ENOXAPARIN SODIUM 40 MG/0.4ML ~~LOC~~ SOLN
40.0000 mg | Freq: Two times a day (BID) | SUBCUTANEOUS | Status: DC
  Administered 2016-08-28 – 2016-09-01 (×9): 40 mg via SUBCUTANEOUS
  Filled 2016-08-28 (×9): qty 0.4

## 2016-08-28 MED ORDER — OXYCODONE HCL 5 MG PO TABS
5.0000 mg | ORAL_TABLET | ORAL | Status: DC | PRN
  Administered 2016-08-28 – 2016-08-29 (×3): 10 mg via ORAL
  Administered 2016-08-29 (×2): 15 mg via ORAL
  Administered 2016-08-30: 10 mg via ORAL
  Administered 2016-08-30 – 2016-09-01 (×8): 15 mg via ORAL
  Filled 2016-08-28 (×3): qty 3
  Filled 2016-08-28: qty 2
  Filled 2016-08-28: qty 3
  Filled 2016-08-28: qty 2
  Filled 2016-08-28 (×7): qty 3
  Filled 2016-08-28 (×3): qty 2

## 2016-08-28 MED ORDER — DOCUSATE SODIUM 100 MG PO CAPS
100.0000 mg | ORAL_CAPSULE | Freq: Two times a day (BID) | ORAL | Status: DC
  Administered 2016-08-28 – 2016-09-01 (×9): 100 mg via ORAL
  Filled 2016-08-28 (×7): qty 1

## 2016-08-28 MED ORDER — HYDROMORPHONE HCL 1 MG/ML IJ SOLN
0.5000 mg | INTRAMUSCULAR | Status: DC | PRN
  Administered 2016-08-28 – 2016-08-30 (×3): 0.5 mg via INTRAVENOUS
  Filled 2016-08-28 (×4): qty 1

## 2016-08-28 MED ORDER — POLYETHYLENE GLYCOL 3350 17 G PO PACK
17.0000 g | PACK | Freq: Every day | ORAL | Status: DC
  Administered 2016-08-28 – 2016-09-01 (×5): 17 g via ORAL
  Filled 2016-08-28 (×5): qty 1
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
## 2016-08-29 LAB — GLUCOSE, CAPILLARY
GLUCOSE-CAPILLARY: 183 mg/dL — AB (ref 65–99)
Glucose-Capillary: 200 mg/dL — ABNORMAL HIGH (ref 65–99)
Glucose-Capillary: 173 mg/dL — ABNORMAL HIGH (ref 65–99)
Glucose-Capillary: 184 mg/dL — ABNORMAL HIGH (ref 65–99)
Glucose-Capillary: 187 mg/dL — ABNORMAL HIGH (ref 65–99)

## 2016-08-29 MED ORDER — GUAIFENESIN ER 600 MG PO TB12
1200.0000 mg | ORAL_TABLET | Freq: Two times a day (BID) | ORAL | Status: DC
  Administered 2016-08-29 – 2016-09-01 (×7): 1200 mg via ORAL
  Filled 2016-08-29 (×8): qty 2
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
## 2016-08-29 NOTE — Progress Notes (Signed)
Rehab Admissions Coordinator Note:  Patient was screened by Clois DupesBoyette, Haelie Clapp Godwin for appropriateness for an Inpatient Acute Rehab Consult per PT recommendation.   At this time, we are recommending Inpatient Rehab consult.  Clois DupesBoyette, Jakhia Buxton Godwin 08/29/2016, 6:00 PM  I can be reached at [phone].

## 2016-08-30 DIAGNOSIS — S32008A Other fracture of unspecified lumbar vertebra, initial encounter for closed fracture: Secondary | ICD-10-CM

## 2016-08-30 DIAGNOSIS — S82891A Other fracture of right lower leg, initial encounter for closed fracture: Secondary | ICD-10-CM

## 2016-08-30 DIAGNOSIS — S82892A Other fracture of left lower leg, initial encounter for closed fracture: Secondary | ICD-10-CM

## 2016-08-30 DIAGNOSIS — S2243XA Multiple fractures of ribs, bilateral, initial encounter for closed fracture: Secondary | ICD-10-CM

## 2016-08-30 LAB — GLUCOSE, CAPILLARY
GLUCOSE-CAPILLARY: 171 mg/dL — AB (ref 65–99)
GLUCOSE-CAPILLARY: 137 mg/dL — AB (ref 65–99)
Glucose-Capillary: 166 mg/dL — ABNORMAL HIGH (ref 65–99)
Glucose-Capillary: 202 mg/dL — ABNORMAL HIGH (ref 65–99)
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
## 2016-08-30 NOTE — Progress Notes (Signed)
Inpatient Rehabilitation  Met with patient to discuss team's recommendation for IP Rehab.  Shared booklets and answered questions.  Patient requested that I initiate insurance authorization.  Plan to follow along for timing of medical readiness, insurance authorization, and bed availability.  Please call with questions.   Melissa Bowie, M.A., CCC/SLP Admission Coordinator  [facility]  Cell [phone]  
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
## 2016-08-31 LAB — GLUCOSE, CAPILLARY
GLUCOSE-CAPILLARY: 161 mg/dL — AB (ref 65–99)
GLUCOSE-CAPILLARY: 180 mg/dL — AB (ref 65–99)
Glucose-Capillary: 148 mg/dL — ABNORMAL HIGH (ref 65–99)
Glucose-Capillary: 154 mg/dL — ABNORMAL HIGH (ref 65–99)
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
## 2016-08-31 NOTE — Progress Notes (Signed)
Inpatient Rehabilitation  Met with patient and spouse regarding IP Rehab admission.  Discussed insurance coverage and answered questions.  I continue to await insurance authorization for IP Rehab.  Plan to follow up tomorrow in hopes of authorization and bed offer.  Please call with questions.    Carmelia Roller., CCC/SLP Admission Coordinator  [facility]  Cell [phone]

## 2016-09-01 ENCOUNTER — Inpatient Hospital Stay (HOSPITAL_COMMUNITY)
Admission: RE | Admit: 2016-09-01 | Discharge: 2016-09-08 | DRG: 559 | Disposition: A | Discharge status: Other Acute Inpatient Hospital | Payer: BLUE CROSS/BLUE SHIELD | Source: Intra-hospital | Attending: Physical Medicine & Rehabilitation | Admitting: Physical Medicine & Rehabilitation

## 2016-09-01 DIAGNOSIS — S32039D Unspecified fracture of third lumbar vertebra, subsequent encounter for fracture with routine healing: Secondary | ICD-10-CM

## 2016-09-01 DIAGNOSIS — E119 Type 2 diabetes mellitus without complications: Secondary | ICD-10-CM

## 2016-09-01 DIAGNOSIS — S82872A Displaced pilon fracture of left tibia, initial encounter for closed fracture: Secondary | ICD-10-CM

## 2016-09-01 DIAGNOSIS — S2243XA Multiple fractures of ribs, bilateral, initial encounter for closed fracture: Secondary | ICD-10-CM | POA: Diagnosis present

## 2016-09-01 DIAGNOSIS — S82452D Displaced comminuted fracture of shaft of left fibula, subsequent encounter for closed fracture with routine healing: Principal | ICD-10-CM

## 2016-09-01 DIAGNOSIS — Z79899 Other long term (current) drug therapy: Secondary | ICD-10-CM

## 2016-09-01 DIAGNOSIS — S32008S Other fracture of unspecified lumbar vertebra, sequela: Secondary | ICD-10-CM | POA: Diagnosis not present

## 2016-09-01 DIAGNOSIS — S32049D Unspecified fracture of fourth lumbar vertebra, subsequent encounter for fracture with routine healing: Secondary | ICD-10-CM | POA: Diagnosis not present

## 2016-09-01 DIAGNOSIS — D72829 Elevated white blood cell count, unspecified: Secondary | ICD-10-CM

## 2016-09-01 DIAGNOSIS — S8251XD Displaced fracture of medial malleolus of right tibia, subsequent encounter for closed fracture with routine healing: Secondary | ICD-10-CM | POA: Diagnosis not present

## 2016-09-01 DIAGNOSIS — F17210 Nicotine dependence, cigarettes, uncomplicated: Secondary | ICD-10-CM | POA: Diagnosis present

## 2016-09-01 DIAGNOSIS — Z7982 Long term (current) use of aspirin: Secondary | ICD-10-CM

## 2016-09-01 DIAGNOSIS — S82852D Displaced trimalleolar fracture of left lower leg, subsequent encounter for closed fracture with routine healing: Secondary | ICD-10-CM | POA: Diagnosis present

## 2016-09-01 DIAGNOSIS — K7200 Acute and subacute hepatic failure without coma: Secondary | ICD-10-CM | POA: Diagnosis present

## 2016-09-01 DIAGNOSIS — D62 Acute posthemorrhagic anemia: Secondary | ICD-10-CM | POA: Diagnosis present

## 2016-09-01 DIAGNOSIS — K5900 Constipation, unspecified: Secondary | ICD-10-CM | POA: Diagnosis present

## 2016-09-01 DIAGNOSIS — S2243XS Multiple fractures of ribs, bilateral, sequela: Secondary | ICD-10-CM | POA: Diagnosis not present

## 2016-09-01 DIAGNOSIS — S82891A Other fracture of right lower leg, initial encounter for closed fracture: Secondary | ICD-10-CM | POA: Diagnosis present

## 2016-09-01 DIAGNOSIS — S82892S Other fracture of left lower leg, sequela: Secondary | ICD-10-CM | POA: Diagnosis not present

## 2016-09-01 DIAGNOSIS — S82891S Other fracture of right lower leg, sequela: Secondary | ICD-10-CM

## 2016-09-01 DIAGNOSIS — S32009A Unspecified fracture of unspecified lumbar vertebra, initial encounter for closed fracture: Secondary | ICD-10-CM | POA: Diagnosis present

## 2016-09-01 DIAGNOSIS — S2243XD Multiple fractures of ribs, bilateral, subsequent encounter for fracture with routine healing: Secondary | ICD-10-CM

## 2016-09-01 DIAGNOSIS — S82892A Other fracture of left lower leg, initial encounter for closed fracture: Secondary | ICD-10-CM

## 2016-09-01 DIAGNOSIS — S82832A Other fracture of upper and lower end of left fibula, initial encounter for closed fracture: Secondary | ICD-10-CM

## 2016-09-01 LAB — GLUCOSE, CAPILLARY
GLUCOSE-CAPILLARY: 139 mg/dL — AB (ref 65–99)
Glucose-Capillary: 126 mg/dL — ABNORMAL HIGH (ref 65–99)

## 2016-09-01 LAB — URINALYSIS, ROUTINE W REFLEX MICROSCOPIC
Glucose, UA: NEGATIVE mg/dL
Hgb urine dipstick: NEGATIVE
KETONES UR: NEGATIVE mg/dL
LEUKOCYTES UA: NEGATIVE
Nitrite: NEGATIVE
PROTEIN: NEGATIVE mg/dL
Specific Gravity, Urine: 1.024 (ref 1.005–1.030)
pH: 5.5 (ref 5.0–8.0)

## 2016-09-01 MED ORDER — ENOXAPARIN SODIUM 30 MG/0.3ML ~~LOC~~ SOLN
30.0000 mg | Freq: Two times a day (BID) | SUBCUTANEOUS | Status: DC
  Administered 2016-09-01 – 2016-09-08 (×14): 30 mg via SUBCUTANEOUS
  Filled 2016-09-01 (×14): qty 0.3

## 2016-09-01 MED ORDER — METFORMIN HCL 500 MG PO TABS
500.0000 mg | ORAL_TABLET | Freq: Every day | ORAL | Status: DC
  Administered 2016-09-02 – 2016-09-07 (×6): 500 mg via ORAL
  Filled 2016-09-01 (×6): qty 1

## 2016-09-01 MED ORDER — TRAZODONE HCL 50 MG PO TABS: 25.0000 mg | ORAL_TABLET | Freq: Every evening | ORAL | Status: DC | PRN

## 2016-09-01 MED ORDER — BISACODYL 10 MG RE SUPP: 10.0000 mg | Freq: Every day | RECTAL | Status: DC | PRN

## 2016-09-01 MED ORDER — GUAIFENESIN-DM 100-10 MG/5ML PO SYRP: 5.0000 mL | ORAL_SOLUTION | Freq: Four times a day (QID) | ORAL | Status: DC | PRN

## 2016-09-01 MED ORDER — ALUM & MAG HYDROXIDE-SIMETH 200-200-20 MG/5ML PO SUSP: 30.0000 mL | ORAL | Status: DC | PRN

## 2016-09-01 MED ORDER — LIDOCAINE 5 % EX PTCH
1.0000 | MEDICATED_PATCH | CUTANEOUS | Status: DC
  Administered 2016-09-01 – 2016-09-06 (×6): 1 via TRANSDERMAL
  Filled 2016-09-01 (×7): qty 1

## 2016-09-01 MED ORDER — PROCHLORPERAZINE MALEATE 5 MG PO TABS: 5.0000 mg | ORAL_TABLET | Freq: Four times a day (QID) | ORAL | Status: DC | PRN

## 2016-09-01 MED ORDER — GUAIFENESIN ER 600 MG PO TB12
1200.0000 mg | ORAL_TABLET | Freq: Two times a day (BID) | ORAL | Status: DC
  Administered 2016-09-01 – 2016-09-07 (×13): 1200 mg via ORAL
  Filled 2016-09-01 (×13): qty 2

## 2016-09-01 MED ORDER — VITAMIN B-6 100 MG PO TABS
100.0000 mg | ORAL_TABLET | Freq: Every day | ORAL | Status: DC
  Administered 2016-09-01 – 2016-09-07 (×7): 100 mg via ORAL
  Filled 2016-09-01 (×8): qty 1

## 2016-09-01 MED ORDER — VITAMIN B-12 1000 MCG PO TABS
1000.0000 ug | ORAL_TABLET | Freq: Every day | ORAL | Status: DC
  Administered 2016-09-01 – 2016-09-07 (×7): 1000 ug via ORAL
  Filled 2016-09-01 (×7): qty 1

## 2016-09-01 MED ORDER — ADULT MULTIVITAMIN W/MINERALS CH
1.0000 | ORAL_TABLET | Freq: Every day | ORAL | Status: DC
  Administered 2016-09-01 – 2016-09-02 (×2): 1 via ORAL
  Filled 2016-09-01 (×4): qty 1

## 2016-09-01 MED ORDER — TRAMADOL HCL 50 MG PO TABS
100.0000 mg | ORAL_TABLET | Freq: Three times a day (TID) | ORAL | Status: DC
  Administered 2016-09-01 – 2016-09-04 (×11): 100 mg via ORAL
  Filled 2016-09-01 (×11): qty 2

## 2016-09-01 MED ORDER — POLYETHYLENE GLYCOL 3350 17 G PO PACK
17.0000 g | PACK | Freq: Two times a day (BID) | ORAL | Status: DC
  Administered 2016-09-01 – 2016-09-07 (×9): 17 g via ORAL
  Filled 2016-09-01 (×13): qty 1

## 2016-09-01 MED ORDER — DIPHENHYDRAMINE HCL 12.5 MG/5ML PO ELIX: 12.5000 mg | ORAL_SOLUTION | Freq: Four times a day (QID) | ORAL | Status: DC | PRN

## 2016-09-01 MED ORDER — ACETAMINOPHEN 325 MG PO TABS
325.0000 mg | ORAL_TABLET | ORAL | Status: DC | PRN
Start: 2016-09-01 — End: 2016-09-08
  Administered 2016-09-03: 650 mg via ORAL
  Filled 2016-09-01: qty 2

## 2016-09-01 MED ORDER — MAGNESIUM HYDROXIDE 400 MG/5ML PO SUSP: 30.0000 mL | Freq: Every day | ORAL | Status: DC | PRN

## 2016-09-01 MED ORDER — TRAMADOL HCL 50 MG PO TABS
100.0000 mg | ORAL_TABLET | Freq: Four times a day (QID) | ORAL | Status: DC
  Administered 2016-09-01 (×2): 100 mg via ORAL
  Filled 2016-09-01 (×2): qty 2

## 2016-09-01 MED ORDER — INSULIN ASPART 100 UNIT/ML ~~LOC~~ SOLN
0.0000 [IU] | Freq: Three times a day (TID) | SUBCUTANEOUS | Status: DC
  Administered 2016-09-01 – 2016-09-03 (×4): 3 [IU] via SUBCUTANEOUS
  Administered 2016-09-03: 2 [IU] via SUBCUTANEOUS
  Administered 2016-09-03 – 2016-09-04 (×2): 3 [IU] via SUBCUTANEOUS
  Administered 2016-09-04 – 2016-09-05 (×2): 2 [IU] via SUBCUTANEOUS
  Administered 2016-09-05: 3 [IU] via SUBCUTANEOUS
  Administered 2016-09-05 – 2016-09-07 (×5): 2 [IU] via SUBCUTANEOUS
  Administered 2016-09-07: 3 [IU] via SUBCUTANEOUS
  Administered 2016-09-07: 2 [IU] via SUBCUTANEOUS

## 2016-09-01 MED ORDER — PROCHLORPERAZINE 25 MG RE SUPP: 12.5000 mg | Freq: Four times a day (QID) | RECTAL | Status: DC | PRN

## 2016-09-01 MED ORDER — DOCUSATE SODIUM 100 MG PO CAPS
100.0000 mg | ORAL_CAPSULE | Freq: Two times a day (BID) | ORAL | Status: DC
  Administered 2016-09-01 – 2016-09-07 (×13): 100 mg via ORAL
  Filled 2016-09-01 (×13): qty 1

## 2016-09-01 MED ORDER — METHOCARBAMOL 500 MG PO TABS
500.0000 mg | ORAL_TABLET | Freq: Four times a day (QID) | ORAL | Status: DC | PRN
  Administered 2016-09-02 – 2016-09-05 (×6): 500 mg via ORAL
  Filled 2016-09-01 (×7): qty 1

## 2016-09-01 MED ORDER — PROCHLORPERAZINE EDISYLATE 5 MG/ML IJ SOLN: 5.0000 mg | Freq: Four times a day (QID) | INTRAMUSCULAR | Status: DC | PRN

## 2016-09-01 MED ORDER — FLEET ENEMA 7-19 GM/118ML RE ENEM: 1.0000 | ENEMA | Freq: Once | RECTAL | Status: DC | PRN

## 2016-09-01 NOTE — Progress Notes (Addendum)
Received pt. As a transfer from [address] north.Pt. Has been oriented to the unit routine and protocol.Safety plan was explained,fall prevention plan was explained and signed.Welcome video was presented.
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
## 2016-09-01 NOTE — Progress Notes (Signed)
Inpatient Rehabilitation  I have insurance authorization to admit patient to IP Rehab.  I have received medical clearance, and have a bed to offer.  Patient in agreement with plan to proceed with admission this afternoon.  Please call with questions.   Charlane FerrettiMelissa Charlane Westry, M.A., CCC/SLP Admission Coordinator  [facility]  Cell [phone]
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
## 2016-09-01 NOTE — Progress Notes (Signed)
Patient transferred to CIR report called to nurse  Family aware of transfer  [name] [name], [name] Ashby Lynn, RN
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
## 2016-09-02 ENCOUNTER — Inpatient Hospital Stay (HOSPITAL_COMMUNITY): Payer: BLUE CROSS/BLUE SHIELD

## 2016-09-02 ENCOUNTER — Inpatient Hospital Stay (HOSPITAL_COMMUNITY): Payer: BLUE CROSS/BLUE SHIELD | Admitting: Occupational Therapy

## 2016-09-02 ENCOUNTER — Encounter (HOSPITAL_COMMUNITY): Payer: Self-pay

## 2016-09-02 ENCOUNTER — Inpatient Hospital Stay (HOSPITAL_COMMUNITY): Payer: Self-pay | Admitting: Physical Therapy

## 2016-09-02 LAB — CBC WITH DIFFERENTIAL/PLATELET
BASOS PCT: 1 %
Basophils Absolute: 0.2 10*3/uL — ABNORMAL HIGH (ref 0.0–0.1)
EOS PCT: 4 %
Eosinophils Absolute: 0.6 10*3/uL (ref 0.0–0.7)
HEMATOCRIT: 36.5 % — AB (ref 39.0–52.0)
Hemoglobin: 13.1 g/dL (ref 13.0–17.0)
LYMPHS ABS: 4 10*3/uL (ref 0.7–4.0)
Lymphocytes Relative: 25 %
MCH: 31.6 pg (ref 26.0–34.0)
MCHC: 35.9 g/dL (ref 30.0–36.0)
MCV: 88 fL (ref 78.0–100.0)
MONO ABS: 1.9 10*3/uL — AB (ref 0.1–1.0)
Monocytes Relative: 12 %
Neutro Abs: 9.2 10*3/uL — ABNORMAL HIGH (ref 1.7–7.7)
Neutrophils Relative %: 58 %
Platelets: 295 10*3/uL (ref 150–400)
RBC: 4.15 MIL/uL — AB (ref 4.22–5.81)
RDW: 13.5 % (ref 11.5–15.5)
WBC: 15.9 10*3/uL — AB (ref 4.0–10.5)

## 2016-09-02 LAB — COMPREHENSIVE METABOLIC PANEL
ALT: 34 U/L (ref 17–63)
ANION GAP: 9 (ref 5–15)
AST: 23 U/L (ref 15–41)
Albumin: 2.7 g/dL — ABNORMAL LOW (ref 3.5–5.0)
Alkaline Phosphatase: 56 U/L (ref 38–126)
BILIRUBIN TOTAL: 0.6 mg/dL (ref 0.3–1.2)
BUN: 12 mg/dL (ref 6–20)
CO2: 30 mmol/L (ref 22–32)
Calcium: 8.9 mg/dL (ref 8.9–10.3)
Chloride: 100 mmol/L — ABNORMAL LOW (ref 101–111)
Creatinine, Ser: 0.82 mg/dL (ref 0.61–1.24)
Glucose, Bld: 158 mg/dL — ABNORMAL HIGH (ref 65–99)
POTASSIUM: 3.6 mmol/L (ref 3.5–5.1)
Sodium: 139 mmol/L (ref 135–145)
TOTAL PROTEIN: 6.4 g/dL — AB (ref 6.5–8.1)

## 2016-09-02 MED ORDER — ADULT MULTIVITAMIN W/MINERALS CH
1.0000 | ORAL_TABLET | Freq: Every day | ORAL | Status: DC
  Administered 2016-09-03 – 2016-09-07 (×5): 1 via ORAL
  Filled 2016-09-02 (×5): qty 1

## 2016-09-02 MED ORDER — LINACLOTIDE 145 MCG PO CAPS
145.0000 ug | ORAL_CAPSULE | Freq: Every day | ORAL | Status: DC
  Administered 2016-09-02 – 2016-09-03 (×2): 145 ug via ORAL
  Filled 2016-09-02 (×2): qty 1

## 2016-09-02 NOTE — Progress Notes (Signed)
[name] DiceRobert [name] is a 55 y.o. male 1961-11-08 [id number]  Subjective: C/o constipation. Needs "normal food". No new problems. Slept well. Feeling OK.  Objective: Vital signs in last 24 hours: Temp:  [98.4 F (36.9 C)-98.5 F (36.9 C)] 98.5 F (36.9 C) (09/23 0550) Pulse Rate:  [83-89] 83 (09/23 0550) Resp:  [16-18] 18 (09/23 0550) BP: (106-169)/(54-87) 106/54 (09/23 0550) SpO2:  [95 %-96 %] 96 % (09/23 0550) Weight change:  Last BM Date: 08/28/16  Intake/Output from previous day: 09/22 0701 - 09/23 0700 In: 1320 [P.O.:1320] Out: 2325 [Urine:2325] Last cbgs: CBG (last 3)   Recent Labs  08/31/16 2131 09/01/16 0652 09/01/16 1207  GLUCAP 180* 126* 139*     Physical Exam General: No apparent distress   HEENT: not dry Lungs: Normal effort. Lungs clear to auscultation, no crackles or wheezes. Cardiovascular: Regular rate and rhythm, no edema Abdomen: S/NT/ND; BS(+) Musculoskeletal:  unchanged Neurological: No new neurological deficits Wounds:slight drainage - L foot/leg - pins/wounds are dressed. R leg in a surgical boot Skin: clear   Mental state: Alert, oriented, cooperative    Lab Results: BMET    Component Value Date/Time   NA 139 09/02/2016 0453   K 3.6 09/02/2016 0453   CL 100 (L) 09/02/2016 0453   CO2 30 09/02/2016 0453   GLUCOSE 158 (H) 09/02/2016 0453   BUN 12 09/02/2016 0453   CREATININE 0.82 09/02/2016 0453   CALCIUM 8.9 09/02/2016 0453   GFRNONAA >60 09/02/2016 0453   GFRAA >60 09/02/2016 0453   CBC    Component Value Date/Time   WBC 15.9 (H) 09/02/2016 0453   RBC 4.15 (L) 09/02/2016 0453   HGB 13.1 09/02/2016 0453   HCT 36.5 (L) 09/02/2016 0453   PLT 295 09/02/2016 0453   MCV 88.0 09/02/2016 0453   MCH 31.6 09/02/2016 0453   MCHC 35.9 09/02/2016 0453   RDW 13.5 09/02/2016 0453   LYMPHSABS 4.0 09/02/2016 0453   MONOABS 1.9 (H) 09/02/2016 0453   EOSABS 0.6 09/02/2016 0453   BASOSABS 0.2 (H) 09/02/2016 0453    Studies/Results: No  results found.  Medications: I have reviewed the patient's current medications.  Assessment/Plan:   1.  Functional and mobility deficits secondary to bilateral ankle fractures/polytrauma             -admit to inpatient rehab 2.  DVT Prophylaxis/Anticoagulation: Pharmaceutical: Lovenox 3. Pain Management: Oxycodone prn 4. Mood: LCSW to follow for evaluation and support.  5. Neuropsych: This patient is capable of making decisions on his own behalf. 6. Skin/Wound Care: Pressure relief measures. Pin care bid and monitor wound for healing.  7. Fluids/Electrolytes/Nutrition:  Monitor I/O.  8. T2DM---recently diagnosed: Hgb A1c- 6.3--CM diet and resume metformin. Will monitor BS ac/hs and use SSI for elevated BS.  Consult dietician for education.  9. ABLA:Will recheck CBC in am.  10.  Abnormal LFTs: Likely due to shocked liver. Will recheck in am 11.  Constipation:  Increase miralax to bid--no BM X 5 days and poor appetite. Enema prn. Linzess   Length of stay, days: 1  Sonda PrimesAlex Plotnikov , MD 09/02/2016, 12:48 PM
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
## 2016-09-02 NOTE — Progress Notes (Signed)
Physical Therapy Session Note  Patient Details  Name: [name] [name] MRN: [id number] Date of Birth: [birth year]  Today's Date: 09/02/2016 PT Individual Time: 1415-1500 PT Individual Time Calculation (min): 45 min    Short Term Goals: Week 1:  PT Short Term Goal 1 (Week 1): STG = LTG due to ELOS  Skilled Therapeutic Interventions/Progress Updates:     PT instructed patient in [facility] mobility for 126f with supervision A with cues for doorway management and decreased turning.   Stair training in parallel bars on 1 step x2; 2 inch step and 4 inch step. PT provided mod Assist and moderate multimodal cues for proper technique UE placement.   PT instructed patient in sit<>stand in parallel bars with min A with cues for imrpoved anterior weight shift and maintainaince of precautions.   Seated therex. For hip and knee on BLE including LAQ, marches, hip abduction. PT provided min cues to improved technique and to keep ROM within pain free range.   [facility] with all needs met.   Therapy Documentation Precautions:  Precautions Precautions: Fall Precaution Comments: external fixator, rib fractures Required Braces or Orthoses: Other Brace/Splint Other Brace/Splint: Rt CAM boot Restrictions Weight Bearing Restrictions: Yes RLE Weight Bearing: Weight bearing as tolerated LLE Weight Bearing: Non weight bearing General:   Vital Signs: Therapy Vitals Temp: 98.4 F (36.9 C) Temp Source: Oral Pulse Rate: 82 Resp: 16 BP: 132/75 Patient Position (if appropriate): Lying Oxygen Therapy SpO2: 98 % Pain: Pain Assessment Pain Assessment: 0-10 Pain Score: 3  Pain Type: Surgical pain Pain Location: Ankle Pain Orientation: Left Pain Frequency: Occasional Pain Onset: With Activity Patients Stated Pain Goal: 2 Pain Intervention(s): Medication (See eMAR)  See Function Navigator for Current Functional Status.   Therapy/Group: Individual  Therapy  [name]9/23/2017, 3:01 PM
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
## 2016-09-02 NOTE — Plan of Care (Signed)
Problem: RH BOWEL ELIMINATION Goal: RH STG MANAGE BOWEL WITH ASSISTANCE STG Manage Bowel with mod I Assistance.  Outcome: Not Progressing LBM 9/18. Refusing suppository/ enema

## 2016-09-02 NOTE — Progress Notes (Signed)
Occupational Therapy Session Note  Patient Details  Name: [name] [name] MRN: [id number] Date of Birth: 1961/02/12  Today's Date: 09/02/2016 OT Individual Time: 1500-1530 OT Individual Time Calculation (min): 30 min   Short Term Goals: Week 1:  OT Short Term Goal 1 (Week 1): STGs equal to LTGs set at supervision level overall.  Skilled Therapeutic Interventions/Progress Updates: ADL-retraining with focus on tub/shower transfer to tub bench.  With extra time and one demonstration, pt ambulated 10' from w/c to tub room bathtub and completed transfer in/out of tub with steadying assist for safety.   HR elevated to 120 bpm after transfer but recovered within 5 minutes.  Pt was escorted back to his room and was educated on method to maintain precautions while bathing, when approved to shower.    Therapy Documentation Precautions:  Precautions Precautions: Fall Precaution Comments: external fixator, rib fractures Required Braces or Orthoses: Other Brace/Splint Other Brace/Splint: Rt CAM boot Restrictions Weight Bearing Restrictions: Yes RLE Weight Bearing: Weight bearing as tolerated LLE Weight Bearing: Non weight bearing   Vital Signs: Therapy Vitals Temp: 98.4 F (36.9 C) Temp Source: Oral Pulse Rate: 82 Resp: 16 BP: 132/75 Patient Position (if appropriate): Lying Oxygen Therapy SpO2: 98 %   Pain: Pain Assessment Pain Assessment: 0-10 Pain Score: 3  Pain Type: Surgical pain Pain Location: Ankle Pain Orientation: Left Pain Frequency: Occasional Pain Onset: With Activity Patients Stated Pain Goal: 2 Pain Intervention(s): Medication (See eMAR)   See Function Navigator for Current Functional Status.   Therapy/Group: Individual Therapy  Myrta Mercer 09/02/2016, 4:08 PM

## 2016-09-03 ENCOUNTER — Inpatient Hospital Stay (HOSPITAL_COMMUNITY): Payer: BLUE CROSS/BLUE SHIELD

## 2016-09-03 ENCOUNTER — Inpatient Hospital Stay (HOSPITAL_COMMUNITY): Payer: Self-pay | Admitting: Occupational Therapy

## 2016-09-03 LAB — URINE CULTURE: CULTURE: NO GROWTH

## 2016-09-03 MED ORDER — LINACLOTIDE 145 MCG PO CAPS: 145.0000 ug | ORAL_CAPSULE | Freq: Every day | ORAL | Status: DC | PRN

## 2016-09-03 NOTE — Progress Notes (Signed)
Occupational Therapy Session Note  Patient Details  Name: [name] DiceRobert [name] MRN: [id number] Date of Birth: 1961-03-16  Today's Date: 09/03/2016 OT Individual Time: 1115-1200 OT Individual Time Calculation (min): 45 min   Short Term Goals: Week 1:  OT Short Term Goal 1 (Week 1): STGs equal to LTGs set at supervision level overall.  Skilled Therapeutic Interventions/Progress Updates:   Pt received at bed level with wife present and pt requesting therex in bed d/t continued moderate pain at left LE.    OT educated spouse first on tub bench transfer as practiced with pt on previous day and then returned to pt's room and provided setup, written HEP and supervision with upper body exercises using theraband.    Pt competed bicep curl, tricep extension, diagonal shoulder flexion and extension, and shoulder internal/external rotation using blue (grade 4 band).   Pt completed 10 reps of each exercise with right and left UE with good effort and 3 rest breaks.  OT advised on use of ice packs for pain management.    Therapy Documentation Precautions: d  Precautions Precautions: Fall Precaution Comments: external fixator, rib fractures Required Braces or Orthoses: Other Brace/Splint Other Brace/Splint: Rt CAM boot Restrictions Weight Bearing Restrictions: Yes RLE Weight Bearing: Weight bearing as tolerated LLE Weight Bearing: Non weight bearing  Vital Signs: Therapy Vitals Temp: 98.5 F (36.9 C) Temp Source: Oral Pulse Rate: 79 Resp: 17 BP: 139/79 Patient Position (if appropriate): Lying Oxygen Therapy SpO2: 98 % O2 Device: Not Delivered   Pain: Pain Assessment Pain Assessment: 0-10 Pain Score: 8  Pain Type: Surgical pain Pain Location: Ankle Pain Orientation: Left Pain Descriptors / Indicators: Constant;Burning;Pressure Pain Frequency: Constant Pain Onset: On-going Patients Stated Pain Goal: 3 Pain Intervention(s): Medication (See eMAR);Repositioned;Emotional support;Elevated  extremity;Distraction  See Function Navigator for Current Functional Status.   Therapy/Group: Individual Therapy  [name] [name] 09/03/2016, 12:57 PM
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
## 2016-09-04 ENCOUNTER — Inpatient Hospital Stay (HOSPITAL_COMMUNITY): Payer: BLUE CROSS/BLUE SHIELD | Admitting: Physical Therapy

## 2016-09-04 ENCOUNTER — Inpatient Hospital Stay (HOSPITAL_COMMUNITY): Payer: Self-pay

## 2016-09-04 ENCOUNTER — Inpatient Hospital Stay (HOSPITAL_COMMUNITY): Payer: BLUE CROSS/BLUE SHIELD | Admitting: Occupational Therapy

## 2016-09-04 ENCOUNTER — Inpatient Hospital Stay (HOSPITAL_COMMUNITY): Payer: BLUE CROSS/BLUE SHIELD

## 2016-09-04 LAB — GLUCOSE, CAPILLARY
GLUCOSE-CAPILLARY: 160 mg/dL — AB (ref 65–99)
GLUCOSE-CAPILLARY: 172 mg/dL — AB (ref 65–99)
GLUCOSE-CAPILLARY: 127 mg/dL — AB (ref 65–99)
GLUCOSE-CAPILLARY: 165 mg/dL — AB (ref 65–99)
GLUCOSE-CAPILLARY: 160 mg/dL — AB (ref 65–99)
Glucose-Capillary: 182 mg/dL — ABNORMAL HIGH (ref 65–99)
Glucose-Capillary: 179 mg/dL — ABNORMAL HIGH (ref 65–99)
Glucose-Capillary: 117 mg/dL — ABNORMAL HIGH (ref 65–99)
Glucose-Capillary: 196 mg/dL — ABNORMAL HIGH (ref 65–99)
Glucose-Capillary: 181 mg/dL — ABNORMAL HIGH (ref 65–99)
Glucose-Capillary: 169 mg/dL — ABNORMAL HIGH (ref 65–99)
Glucose-Capillary: 127 mg/dL — ABNORMAL HIGH (ref 65–99)

## 2016-09-04 MED ORDER — OXYCODONE HCL 5 MG PO TABS
5.0000 mg | ORAL_TABLET | ORAL | Status: DC | PRN
  Administered 2016-09-04 – 2016-09-07 (×8): 5 mg via ORAL
  Filled 2016-09-04 (×8): qty 1

## 2016-09-04 MED ORDER — TRAMADOL HCL 50 MG PO TABS
50.0000 mg | ORAL_TABLET | Freq: Three times a day (TID) | ORAL | Status: DC
  Administered 2016-09-04 – 2016-09-06 (×8): 50 mg via ORAL
  Filled 2016-09-04 (×8): qty 1

## 2016-09-04 NOTE — Progress Notes (Signed)
Occupational Therapy Session Note  Patient Details  Name: [name] DiceRobert [name] MRN: [id number] Date of Birth: 12-19-1960  Today's Date: 09/04/2016 OT Individual Time: 1300-1330 OT Individual Time Calculation (min): 30 min     Short Term Goals: Week 1:  OT Short Term Goal 1 (Week 1): STGs equal to LTGs set at supervision level overall.  Skilled Therapeutic Interventions/Progress Updates:    Engaged in BUE therapeutic activities with focus on strength and ROM.  Used 5# weight ball, and 10 # free weights.  Pt performed all exercises supine with no  problems.  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  Left pt in room with wife and all needs in place Therapy Documentation Precautions:  Precautions Precautions: Fall Precaution Comments: external fixator, rib fractures Required Braces or Orthoses: Other Brace/Splint Other Brace/Splint: Rt CAM boot Restrictions Weight Bearing Restrictions: Yes RLE Weight Bearing: Weight bearing as tolerated LLE Weight Bearing: Non weight bearing General:   Vital Signs: Therapy Vitals Temp: 98.6 F (37 C) Temp Source: Oral Pulse Rate: 83 Resp: 18 BP: 126/66 Patient Position (if appropriate): Lying Oxygen Therapy SpO2: 98 % O2 Device: Not Delivered Pain: Pain Assessment Pain Score: 7  Pain Descriptors / Indicators: Throbbing     See Function Navigator for Current Functional Status.   Therapy/Group: Individual Therapy  Humberto Sealsdwards, Charmayne Odell J 09/04/2016, 5:03 PM
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
## 2016-09-04 NOTE — Progress Notes (Signed)
Occupational Therapy Session Note  Patient Details  Name: [name] DiceRobert [name] MRN: [id number] Date of Birth: 05-11-1961  Today's Date: 09/04/2016 OT Individual Time: 1000-1055 OT Individual Time Calculation (min): 55 min     Short Term Goals: Week 1:  OT Short Term Goal 1 (Week 1): STGs equal to LTGs set at supervision level overall.  Skilled Therapeutic Interventions/Progress Updates:    Pt resting in bed with RN attending to external fixator.  Wife present. Pt engaged in bed mobility and discharge planning during session. Discussed home safety and home setup after discharge.  Recommended drop arm BSC and notified CSW.  Pt stated he was exhausted from earlier PT session and dressing changes.  Pt declined bathing/dressing at this time but agreed to engaging in tasks in the morning.   Therapy Documentation Precautions:  Precautions Precautions: Fall Precaution Comments: external fixator, rib fractures Required Braces or Orthoses: Other Brace/Splint Other Brace/Splint: Rt CAM boot Restrictions Weight Bearing Restrictions: Yes RLE Weight Bearing: Weight bearing as tolerated LLE Weight Bearing: Non weight bearing General:   Vital Signs:   Pain: Pain Assessment Pain Score: 7  Pain Descriptors / Indicators: Throbbing ADL:   Exercises:   Other Treatments:    See Function Navigator for Current Functional Status.   Therapy/Group: Individual Therapy  Rich BraveLanier, Shray Hunley Chappell 09/04/2016, 11:07 AM

## 2016-09-04 NOTE — Progress Notes (Signed)
L ankle dressing changed per MD's order. Pin site cleansed with betadine, and dry dressing replaced. Nonverbal cues of discomfort noted during dressing change. Visible blisters x 5 to L lateral ankle (4 serous filled blisters, and 1 with visible scab to the area). Tegaderm placed to blisters to contain possible drainage.

## 2016-09-04 NOTE — Progress Notes (Signed)
Physical Therapy Session Note  Patient Details  Name: Donney DiceRobert Malecha MRN: [id number] Date of Birth: 08-22-1961  Today's Date: 09/04/2016 PT Individual Time: 0805-0902 PT Individual Time Calculation (min): 57 min    Short Term Goals: Week 1:  PT Short Term Goal 1 (Week 1): STG = LTG due to ELOS  Skilled Therapeutic Interventions/Progress Updates:    Pt received in bed & agreeable to PT, denying c/o pain at rest but noted 7/10 pain in LLE with LE in dependent position. Session focused on pt education, d/c planning and goal setting. Discussed benefits of discharge at w/c level to allow BLE to heel. Pt reports he has 3" step down sidewalk then level entry into home. Provided pt with home measurement sheet to ensure pt's w/c will fit in home. Pt will need a 22x18 w/c with elevating leg rests upon d/c. Pt transferred supine>sitting EOB with supervision overall. Therapist provided total assist for w/c set up & pt performed lateral scoot bed>w/c with supervision overall with fatigue noted after. Pt propelled w/c x 100 ft + 100 ft room<>gym with BUE & supervision. At end of session pt returned to room and transferred w/c>bed, sitting>supine in same manner as noted above. Pt required max cuing for safety awareness to not push through LLE when scooting to head of bed. At end of session pt left in bed with all needs within reach & bed alarm set. Educated pt on safety plan & need to call for assistance when wanting to transfer OOB.  Therapy Documentation Precautions:  Precautions Precautions: Fall Precaution Comments: external fixator, rib fractures Required Braces or Orthoses: Other Brace/Splint Other Brace/Splint: Rt CAM boot Restrictions Weight Bearing Restrictions: Yes RLE Weight Bearing: Weight bearing as tolerated LLE Weight Bearing: Non weight bearing   See Function Navigator for Current Functional Status.   Therapy/Group: Individual Therapy  Sandi MariscalVictoria M Dorrine Montone 09/04/2016, 1:00 PM

## 2016-09-04 NOTE — Progress Notes (Signed)
Patient information reviewed and entered into eRehab system by Joei Frangos, RN, CRRN, PPS Coordinator.  Information including medical coding and functional independence measure will be reviewed and updated through discharge.    

## 2016-09-04 NOTE — Plan of Care (Signed)
Problem: RH Ambulation Goal: LTG Patient will ambulate in controlled environment (PT) LTG: Patient will ambulate in a controlled environment, # of feet with assistance (PT).  Outcome: Not Applicable Date Met: 10/68/16 D/c 2/2 w/c level goals  Problem: RH Stairs Goal: LTG Patient will ambulate up and down stairs w/assist (PT) LTG: Patient will ambulate up and down # of stairs with assistance (PT)  Outcome: Not Applicable Date Met: 61/96/94 D/c 2/2 w/c level goals
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
## 2016-09-04 NOTE — Plan of Care (Signed)
Problem: RH PAIN MANAGEMENT Goal: RH STG PAIN MANAGED AT OR BELOW PT'S PAIN GOAL <5 Outcome: Not Progressing Rates pain as 7

## 2016-09-04 NOTE — Progress Notes (Signed)
Physical Therapy Note  Patient Details  Name: [name] DiceRobert [name] MRN: [id number] Date of Birth: 1961-10-23 Today's Date: 09/04/2016  1500-1600, 60 min Pain: 5/10 L LE. Premedicated  Supine Therapeutic exercise performed with LE to increase strength for functional mobility: R/L straight leg raises, R/L hip abduction/adduction, bil hip internal rotation, R/L short arc quad knee extension, x 10 each modified abdominal crunches x 10 x 2.  Rolling L and positioning with pillows in order to trial R sidelying in flat bed.  Pt did not find it very comfortable, and chooses to lie on back instead.  Squat pivot to L raised bed> w/c. Pt managed R armrest with supervision. W/c propulsion using bil UEs.  Pt stated he does not have carpet at home, and a level entry way. Pt left resting in w/c with all needs within reach.  Reana Chacko 09/04/2016, 3:20 PM

## 2016-09-04 NOTE — Plan of Care (Signed)
Problem: RH SKIN INTEGRITY Goal: RH STG SKIN FREE OF INFECTION/BREAKDOWN Mod A Outcome: Not Progressing Max  A at this time

## 2016-09-04 NOTE — Care Management Note (Signed)
[facility] Individual Statement of Services  Patient Name:  Donney DiceRobert Newbrough  Date:  09/04/2016  Welcome to the [facility].  Our goal is to provide you with an individualized program based on your diagnosis and situation, designed to meet your specific needs.  With this comprehensive rehabilitation program, you will be expected to participate in at least 3 hours of rehabilitation therapies Monday-Friday, with modified therapy programming on the weekends.  Your rehabilitation program will include the following services:  Physical Therapy (PT), Occupational Therapy (OT), 24 hour per day rehabilitation nursing, Therapeutic Recreaction (TR), Neuropsychology, Case Management (Social Worker), Rehabilitation Medicine, Nutrition Services and Pharmacy Services  Weekly team conferences will be held on Tuesdays to discuss your progress.  Your Social Worker will talk with you frequently to get your input and to update you on team discussions.  Team conferences with you and your family in attendance may also be held.  Expected length of stay: 8/12 days  Overall anticipated outcome: supervision  Depending on your progress and recovery, your program may change. Your Social Worker will coordinate services and will keep you informed of any changes. Your Social Worker's name and contact numbers are listed  below.  The following services may also be recommended but are not provided by the [facility]:   Driving Evaluations  Home Health Rehabiltiation Services  Outpatient Rehabilitation Services  Vocational Rehabilitation   Arrangements will be made to provide these services after discharge if needed.  Arrangements include referral to agencies that provide these services.  Your insurance has been verified to be:  [place] Your primary doctor is:  Dr. Windle GuardWilson Elkins  Pertinent information will be shared with your doctor and your insurance  company.  Social Worker:  [place] CityLucy Tykeem Lanzer, [place] [phone] or ([phone]) [phone]   Information discussed with and copy given to patient by: Amada JupiterHOYLE, Catharina Pica, 09/04/2016, 3:56 PM

## 2016-09-04 NOTE — Plan of Care (Signed)
Problem: RH Other (Specify) Goal: RH LTG Other (Specify)1 Pt's caregiver will demonstrate ability to safely assist pt down 3" step in w/c without cuing.
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
## 2016-09-05 ENCOUNTER — Inpatient Hospital Stay (HOSPITAL_COMMUNITY): Payer: Self-pay | Admitting: Physical Therapy

## 2016-09-05 ENCOUNTER — Inpatient Hospital Stay (HOSPITAL_COMMUNITY): Payer: BLUE CROSS/BLUE SHIELD | Admitting: Physical Therapy

## 2016-09-05 ENCOUNTER — Inpatient Hospital Stay (HOSPITAL_COMMUNITY): Payer: Self-pay

## 2016-09-05 LAB — GLUCOSE, CAPILLARY
GLUCOSE-CAPILLARY: 134 mg/dL — AB (ref 65–99)
GLUCOSE-CAPILLARY: 125 mg/dL — AB (ref 65–99)
Glucose-Capillary: 107 mg/dL — ABNORMAL HIGH (ref 65–99)
Glucose-Capillary: 127 mg/dL — ABNORMAL HIGH (ref 65–99)

## 2016-09-05 NOTE — Progress Notes (Signed)
Occupational Therapy Note  Patient Details  Name: [name] DiceRobert [name] MRN: [id number] Date of Birth: 1961-02-28  Today's Date: 09/05/2016 OT Individual Time: 1130-1200 OT Individual Time Calculation (min): 30 min    Pt c/o 7/10 pain in LLE; RN aware and repositioned Individual Therapy  Pt resting in bed upon arrival.  RN had just changed dressing on LLE.  Pt stated he was "exhausted" from earlier PT session and "hurting." Pt agreed to engage in BUE therex at bed level.  Pt performed all BUE therex with theraband without verbal cues. Focus on BUE therex to increase activity tolerance and upper body strength to increase independence with BADLs.   Lavone NeriLanier, Dovid Bartko [facility] 09/05/2016, 12:03 PM

## 2016-09-05 NOTE — Plan of Care (Signed)
Problem: RH Ambulation Goal: LTG Patient will ambulate in home environment (PT) LTG: Patient will ambulate in home environment, # of feet with assistance (PT).  15 ft with LRAD; Downgrade, ambulation for home access only
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
## 2016-09-05 NOTE — Progress Notes (Signed)
Occupational Therapy Session Note  Patient Details  Name: [name] [name] MRN: [id number] Date of Birth: 1961-01-03  Today's Date: 09/05/2016 OT Individual Time: 0700-0800 OT Individual Time Calculation (min): 60 min     Short Term Goals: Week 1:  OT Short Term Goal 1 (Week 1): STGs equal to LTGs set at supervision level overall.  Skilled Therapeutic Interventions/Progress Updates:    Pt resting in bed upon arrival.  Pt engaged in BADL retraining including bathing/dressing with sit<>stand from EOB.  Pt will perform bathing/dressing tasks seated EOB when discharged home.  Pt used reacher to assist with doffing pants to bathe LB.  Pt performed sit<>stand and standing tasks (bathing buttocks) at close supervision level.  Pt completed pulling up pants utilizing lateral leans.  Pt fatigues easily and requires multiple rest breaks during BADL sessions. Continued discharge planning.  Educated on home safety.  Therapy Documentation Precautions:  Precautions Precautions: Fall Precaution Comments: external fixator, rib fractures Required Braces or Orthoses: Other Brace/Splint Other Brace/Splint: Rt CAM boot Restrictions Weight Bearing Restrictions: Yes RLE Weight Bearing: Weight bearing as tolerated LLE Weight Bearing: Non weight bearing Pain: Pt c/o 6/10 pain LLE after activity and sit<>stand; RN aware, premedicated, repositioned See Function Navigator for Current Functional Status.   Therapy/Group: Individual Therapy  Rich BraveLanier, Mel Langan Chappell 09/05/2016, 8:04 AM

## 2016-09-05 NOTE — Progress Notes (Signed)
Physical Therapy Note  Patient Details  Name: [name] DiceRobert [name] MRN: [id number] Date of Birth: 1961/11/10 Today's Date: 09/05/2016    Time: [phone] 60 minutes (30 minutes co-tx with TR)  1:1 Pt c/o Lt ankle pain with dependent position, RN aware, meds given during session.  Supine therex Lt LE 2 x 15 SLR, hip abd, SAQ, heel slide, quad set. Rt LE quad set, glute squeeze.  Transfers scoot pivot with supervision. W/c mobility throughout unit supervision, needs assist with w/c parts management.  Gait training with RW with much increased pain with min A 10' x 2.  UBE for UE strength and endurance x 10 minutes level 3, alternating minutes fwd and bkwd. Discussed home set up with pt/wife, encouraged use of bedside commode to decrease need for pt to perform gait at home.  DONAWERTH,KAREN 09/05/2016, 11:13 AM

## 2016-09-06 ENCOUNTER — Inpatient Hospital Stay (HOSPITAL_COMMUNITY): Payer: BLUE CROSS/BLUE SHIELD | Admitting: Occupational Therapy

## 2016-09-06 ENCOUNTER — Inpatient Hospital Stay (HOSPITAL_COMMUNITY): Payer: Self-pay | Admitting: Physical Therapy

## 2016-09-06 ENCOUNTER — Inpatient Hospital Stay (HOSPITAL_COMMUNITY): Payer: Self-pay

## 2016-09-06 ENCOUNTER — Inpatient Hospital Stay (HOSPITAL_COMMUNITY): Payer: BLUE CROSS/BLUE SHIELD | Admitting: Physical Therapy

## 2016-09-06 LAB — CBC WITH DIFFERENTIAL/PLATELET
BASOS PCT: 1 %
Basophils Absolute: 0.1 10*3/uL (ref 0.0–0.1)
EOS PCT: 3 %
Eosinophils Absolute: 0.4 10*3/uL (ref 0.0–0.7)
HEMATOCRIT: 39.6 % (ref 39.0–52.0)
Hemoglobin: 14.2 g/dL (ref 13.0–17.0)
LYMPHS ABS: 4.4 10*3/uL — AB (ref 0.7–4.0)
Lymphocytes Relative: 34 %
MCH: 31.6 pg (ref 26.0–34.0)
MCHC: 35.9 g/dL (ref 30.0–36.0)
MCV: 88.2 fL (ref 78.0–100.0)
MONO ABS: 1.3 10*3/uL — AB (ref 0.1–1.0)
MONOS PCT: 10 %
Neutro Abs: 6.8 10*3/uL (ref 1.7–7.7)
Neutrophils Relative %: 52 %
PLATELETS: 394 10*3/uL (ref 150–400)
RBC: 4.49 MIL/uL (ref 4.22–5.81)
RDW: 13.5 % (ref 11.5–15.5)
WBC: 13 10*3/uL — AB (ref 4.0–10.5)

## 2016-09-06 LAB — GLUCOSE, CAPILLARY
GLUCOSE-CAPILLARY: 105 mg/dL — AB (ref 65–99)
GLUCOSE-CAPILLARY: 131 mg/dL — AB (ref 65–99)
Glucose-Capillary: 174 mg/dL — ABNORMAL HIGH (ref 65–99)

## 2016-09-06 MED ORDER — OXYCODONE HCL ER 10 MG PO T12A
10.0000 mg | EXTENDED_RELEASE_TABLET | Freq: Two times a day (BID) | ORAL | Status: DC
  Administered 2016-09-06 – 2016-09-07 (×4): 10 mg via ORAL
  Filled 2016-09-06 (×4): qty 1

## 2016-09-06 MED ORDER — SORBITOL 70 % SOLN
30.0000 mL | Status: AC
  Administered 2016-09-06: 30 mL via ORAL
  Filled 2016-09-06: qty 30
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
## 2016-09-06 NOTE — Progress Notes (Signed)
Occupational Therapy Session Note  Patient Details  Name: [name] DiceRobert Cabriales MRN: [id number] Date of Birth: [birth year]  Today's Date: 09/06/2016 OT Individual Time: 1107-1200 OT Individual Time Calculation (min): 53 min     Short Term Goals:Week 1:  OT Short Term Goal 1 (Week 1): STGs equal to LTGs set at supervision level overall.  Skilled Therapeutic Interventions/Progress Updates:    Pt seen for OT session focusing on functional transfers and UE strengthening/ endurance. Pt in supine upon arrival with wife present and agreeable to tx session.  Practiced simulated toilet transfers to drop arm BSC. Completed squat pivot and stand pivot with RW. Pt close supervision for both transfer types. Educated regarding set-up and seating position required to do squat pivot vs. Need for stand pivot. Pt plans to complete toilet transfers recliner <> BSC, meaning pt will need to complete stand pivots due to arm rests.  Pt unable to tolerate L LE in unsupported position for >~10 seconds, requiring supine or elevated LE rest breaks throughout session.  Pt taken to ADL apartment where he completed squat pivot transfer to low soft surface couch with supervision. He was able to stand from couch to RW with min A and increased time. In therapy gym, pt completed zoom ball activity with therapist and wife for UE strengthening and endurance. Completed x2 trials with rest break provided btwn trial. Pt self propelled w/c back to room at end of session, left sitting up in chair in prep for lunch with all needs in reach. Pt and caregiver educated throughout session regarding management of DME features, and d/c planning.   Therapy Documentation Precautions:  Precautions Precautions: Fall Precaution Comments: external fixator, rib fractures Required Braces or Orthoses: Other Brace/Splint Other Brace/Splint: Rt CAM boot Restrictions Weight Bearing Restrictions: Yes RLE Weight Bearing: Weight bearing as tolerated LLE  Weight Bearing: Non weight bearing Pain:   8/10, repositioned, increased activity  See Function Navigator for Current Functional Status.   Therapy/Group: Individual Therapy  Lewis, Jesus Nevills C 09/06/2016, 3:57 PM

## 2016-09-06 NOTE — Progress Notes (Signed)
Social Work Patient ID: [name] [name], male   DOB: [birth year], 55 y.o.   MRN: [id number]   Met with pt and wife yesterday following team conference.  Both surprised but agreeable with targeted dc date of 9/29 and supervision/ min assist goals overall.  Reviewed follow up services and DME needs.  Continue to follow.  Gabrelle Roca, LCSW

## 2016-09-06 NOTE — Progress Notes (Signed)
R ankle dressing changed per order. Sutures x 2 to R lateral  ankle, approximated, unremarkable. Wound to R upper leg, slightly opened, and pink.  Wound cleansed with NS and dry dressing reapplied. R patella abrasion with dry dsg in place.   L ankle pinsite care done, per order. Small amount of old drainage noted on gauze from R distal pin site. No active drainage noted in any area. Blisters x 4 with serous fluid, still intact.
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
## 2016-09-06 NOTE — Progress Notes (Signed)
Spoke to [place] in Microbiology, in order to collect an anaerobic culture on the pt's wound, we would need to collect the specimen using an E-swab. Lab does not keep them in stock, therefore, we will need to order from materials.  E-swab ordered, nurse to collect once received, with pt's next dressing change.

## 2016-09-06 NOTE — Progress Notes (Signed)
Physical Therapy Session Note  Patient Details  Name: [name] DiceRobert [name] MRN: [id number] Date of Birth: 02/06/1961  Today's Date: 09/06/2016 PT Individual Time: 0804-0901 PT Individual Time Calculation (min): 57 min    Short Term Goals: Week 1:  PT Short Term Goal 1 (Week 1): STG = LTG due to ELOS  Skilled Therapeutic Interventions/Progress Updates:    Pt received in bed & agreeable to treatment, noting 3/10 pain in LLE at rest with increasing pain in dependent position. RN administered pain medication during session & discussed importance of maintaining LLE elevation to help alleviate pain. Pt's wife, [name] [name], present for session. Educated pt & family on car transfer technique (pt sitting in backseat of car) to maintain LLE elevation and w/c parts management. Pt reports he has very minimal armrests on recliner, similar to bed<>w/c transfer, and voiced no concerns regarding transfer to his recliner. Pt reported door to home is 36" inches wide therefore his w/c will fit through door. Discussed home entry & pt reports he has option to enter house without negotiating 3" step in sidewalk. Instructed & provided demonstration to pt & wife on step negotiation with pt in w/c & both feel they would be more comfortable pushing pt through grass into home & plan to do this instead of negotiating step. During session pt transferred bed<>w/c via lateral scoot & supervision and propelled w/c 150 ft + 150 ft with BUE & mod I in controlled environment. At end of session pt left in bed with all needs within reach, wife present, and BLE elevated. Reinforced need for pt to not push through LLE when scooting up in bed.  Therapy Documentation Precautions:  Precautions Precautions: Fall Precaution Comments: external fixator, rib fractures Required Braces or Orthoses: Other Brace/Splint Other Brace/Splint: Rt CAM boot Restrictions Weight Bearing Restrictions: Yes RLE Weight Bearing: Weight bearing as tolerated LLE  Weight Bearing: Non weight bearing   See Function Navigator for Current Functional Status.   Therapy/Group: Individual Therapy  Sandi MariscalVictoria M Miller 09/06/2016, 12:25 PM

## 2016-09-06 NOTE — Progress Notes (Signed)
Occupational Therapy Session Note  Patient Details  Name: Donney DiceRobert Kerrigan MRN: [id number] Date of Birth: 1961/11/10  Today's Date: 09/06/2016 OT Individual Time: 0700-0755 OT Individual Time Calculation (min): 55 min     Short Term Goals: Week 1:  OT Short Term Goal 1 (Week 1): STGs equal to LTGs set at supervision level overall.  Skilled Therapeutic Interventions/Progress Updates:    Pt engaged in BADL retraining including bathing/dressing with sit<>stand from EOB.  Pt requires assistance with doffing/donning pants over LLE external fixator.  Pt required rest breaks during tasks stating his LLE starts throbbing when resting on floor.  Pt performs sit<>stand with supervision and is able to pull up pants without assistance.  Continued discharge planning and home safety recommendations.  Focus on activity tolerance, sit<>stand, standing balance, bed mobility, AE use, and safety awareness to increase independence with BADLs.  Therapy Documentation Precautions:  Precautions Precautions: Fall Precaution Comments: external fixator, rib fractures Required Braces or Orthoses: Other Brace/Splint Other Brace/Splint: Rt CAM boot Restrictions Weight Bearing Restrictions: Yes RLE Weight Bearing: Weight bearing as tolerated LLE Weight Bearing: Non weight bearing General:   Vital Signs: Therapy Vitals Temp: 98.7 F (37.1 C) Temp Source: Oral Pulse Rate: 89 Resp: 18 BP: (!) 143/77 Patient Position (if appropriate): Lying Oxygen Therapy SpO2: 98 % O2 Device: Not Delivered Pain:   ADL:   Exercises:   Other Treatments:    See Function Navigator for Current Functional Status.   Therapy/Group: Individual Therapy  Rich BraveLanier, Shion Bluestein Chappell 09/06/2016, 8:03 AM

## 2016-09-06 NOTE — Progress Notes (Signed)
Patient specimen collected but I was informed by lab that they cannot do the anaerobic because the incorrect container was used. Incoming nurse was will recollect the specimen when we get the proper container.

## 2016-09-06 NOTE — Progress Notes (Signed)
Physical Therapy Session Note  Patient Details  Name: [name] [name] MRN: [id number] Date of Birth: Sep 27, 1961  Today's Date: 09/06/2016 PT Individual Time: 1332-1400 PT Individual Time Calculation (min): 28 min    Short Term Goals: Week 1:  PT Short Term Goal 1 (Week 1): STG = LTG due to ELOS  Skilled Therapeutic Interventions/Progress Updates:   Pt presented in bed agreeable to therapy.  Pt stating need to use BR, pt performed supine to sit with supervision and use of bed rails.  Sit to stand with supervision and ambulated approx 68f to bathroom.  Pt was able to sit on raised toilet seat with use of rail and supervision.  After toileting pt returned to bed and performed supine therex including SLR, SAQ, hip abd/add, and pillow squeezes x 10 bilaterally.  Pt eudcated on benefits of therex for both LE to maintain strength.  Pt left in bed with wife present and all current needs met.   Therapy Documentation Precautions:  Precautions Precautions: Fall Precaution Comments: external fixator, rib fractures Required Braces or Orthoses: Other Brace/Splint Other Brace/Splint: Rt CAM boot Restrictions Weight Bearing Restrictions: Yes RLE Weight Bearing: Weight bearing as tolerated LLE Weight Bearing: Non weight bearing General:   Vital Signs: Therapy Vitals Temp: 98.4 F (36.9 C) Temp Source: Oral Pulse Rate: 99 Resp: 18 BP: 139/75 Patient Position (if appropriate): Lying Oxygen Therapy SpO2: 97 % O2 Device: Not Delivered Pain:     See Function Navigator for Current Functional Status.   Therapy/Group: Individual Therapy  Savior Himebaugh  Bobbijo Holst, PTA  09/06/2016, 4:52 PM

## 2016-09-07 ENCOUNTER — Inpatient Hospital Stay (HOSPITAL_COMMUNITY): Payer: Self-pay

## 2016-09-07 ENCOUNTER — Inpatient Hospital Stay (HOSPITAL_COMMUNITY): Payer: Self-pay | Admitting: Physical Therapy

## 2016-09-07 ENCOUNTER — Inpatient Hospital Stay (HOSPITAL_COMMUNITY): Payer: BLUE CROSS/BLUE SHIELD

## 2016-09-07 DIAGNOSIS — D72829 Elevated white blood cell count, unspecified: Secondary | ICD-10-CM

## 2016-09-07 DIAGNOSIS — D62 Acute posthemorrhagic anemia: Secondary | ICD-10-CM

## 2016-09-07 LAB — GLUCOSE, CAPILLARY
GLUCOSE-CAPILLARY: 132 mg/dL — AB (ref 65–99)
GLUCOSE-CAPILLARY: 131 mg/dL — AB (ref 65–99)
GLUCOSE-CAPILLARY: 141 mg/dL — AB (ref 65–99)
GLUCOSE-CAPILLARY: 125 mg/dL — AB (ref 65–99)
Glucose-Capillary: 142 mg/dL — ABNORMAL HIGH (ref 65–99)

## 2016-09-07 MED ORDER — POLYETHYLENE GLYCOL 3350 17 G PO PACK: 17.0000 g | PACK | Freq: Two times a day (BID) | ORAL | 0 refills | Status: DC

## 2016-09-07 MED ORDER — OXYCODONE HCL 5 MG PO TABS: 5.0000 mg | ORAL_TABLET | Freq: Four times a day (QID) | ORAL | 0 refills | Status: DC | PRN

## 2016-09-07 MED ORDER — BLOOD GLUCOSE MONITOR KIT: PACK | 0 refills | Status: AC

## 2016-09-07 MED ORDER — GUAIFENESIN ER 600 MG PO TB12: 1200.0000 mg | ORAL_TABLET | Freq: Two times a day (BID) | ORAL | 0 refills | Status: DC

## 2016-09-07 MED ORDER — CEFAZOLIN SODIUM-DEXTROSE 2-4 GM/100ML-% IV SOLN
2.0000 g | INTRAVENOUS | Status: DC
  Administered 2016-09-08: 2 g via INTRAVENOUS
  Filled 2016-09-07: qty 100

## 2016-09-07 MED ORDER — ENOXAPARIN SODIUM 30 MG/0.3ML ~~LOC~~ SOLN: 30.0000 mg | Freq: Two times a day (BID) | SUBCUTANEOUS | 0 refills | Status: DC

## 2016-09-07 MED ORDER — METHOCARBAMOL 500 MG PO TABS: 500.0000 mg | ORAL_TABLET | Freq: Four times a day (QID) | ORAL | 0 refills | Status: DC | PRN

## 2016-09-07 MED ORDER — DOCUSATE SODIUM 100 MG PO CAPS: 100.0000 mg | ORAL_CAPSULE | Freq: Two times a day (BID) | ORAL | 0 refills | Status: DC

## 2016-09-07 MED ORDER — OXYCODONE HCL ER 10 MG PO T12A: 10.0000 mg | EXTENDED_RELEASE_TABLET | Freq: Every day | ORAL | 0 refills | Status: DC

## 2016-09-07 MED ORDER — OXYCODONE HCL ER 10 MG PO T12A: 10.0000 mg | EXTENDED_RELEASE_TABLET | Freq: Two times a day (BID) | ORAL | 0 refills | Status: DC

## 2016-09-07 MED ORDER — METFORMIN HCL 500 MG PO TABS: 500.0000 mg | ORAL_TABLET | Freq: Every day | ORAL | 0 refills | Status: DC

## 2016-09-07 MED ORDER — ACETAMINOPHEN 325 MG PO TABS: 325.0000 mg | ORAL_TABLET | ORAL | Status: DC | PRN

## 2016-09-07 NOTE — Progress Notes (Signed)
Occupational Therapy Discharge Summary  Patient Details  Name: [name] [name] MRN: [id number] Date of Birth: 04-17-1961   Patient has met 8 of 8 long term goals due to improved activity tolerance, improved balance, postural control, ability to compensate for deficits and improved coordination.  Pt made excellent progress with BADLs during this admission.  Pt continues require assistance with threading his pants over his LLE external fixator but completes all other tasks without assistance.  Pt performs drop arm BSC transfers at supervision level.  Pt's wife has been present for therapy sessions.  Pt and wife pleased with progress and ready for discharge home. Patient to discharge at overall Supervision level with min A for LB dressing.  Patient's care partner is independent to provide the necessary physical assistance at discharge.    Recommendation:  Patient will benefit from ongoing skilled OT services in home health setting to continue to advance functional skills in the area of BADL and iADL.  Equipment: drop arm BSC  Reasons for discharge: treatment goals met and discharge from hospital  Patient/family agrees with progress made and goals achieved: Yes  OT Discharge Vision/Perception  Vision- History Baseline Vision/History: Wears glasses Wears Glasses: Reading only Patient Visual Report: No change from baseline Vision- Assessment Vision Assessment?: No apparent visual deficits  Cognition Overall Cognitive Status: Within Functional Limits for tasks assessed Arousal/Alertness: Awake/alert Orientation Level: Oriented X4 Memory: Appears intact Awareness: Appears intact Problem Solving: Appears intact Safety/Judgment: Appears intact Sensation Sensation Light Touch: Appears Intact Stereognosis: Appears Intact Hot/Cold: Appears Intact Proprioception: Appears Intact Coordination Gross Motor Movements are Fluid and Coordinated: Yes Fine Motor Movements are Fluid and  Coordinated: Yes Finger Nose Finger Test: [facility]  Motor  Motor Motor: Within Functional Limits    Trunk/Postural Assessment  Cervical Assessment Cervical Assessment: Within Functional Limits Thoracic Assessment Thoracic Assessment: Within Functional Limits Lumbar Assessment Lumbar Assessment: Within Functional Limits Postural Control Postural Control: Within Functional Limits  Balance Static Sitting Balance Static Sitting - Balance Support: No upper extremity supported Static Sitting - Level of Assistance: 6: Modified independent (Device/Increase time) Dynamic Sitting Balance Dynamic Sitting - Balance Support: No upper extremity supported Dynamic Sitting - Level of Assistance: 6: Modified independent (Device/Increase time) Extremity/Trunk Assessment RUE Assessment RUE Assessment: Within Functional Limits LUE Assessment LUE Assessment: Within Functional Limits   See Function Navigator for Current Functional Status.  [name] [facility] 09/07/2016, 6:53 AM

## 2016-09-07 NOTE — Progress Notes (Signed)
Physical Therapy Note  Patient Details  Name: Donney DiceRobert Livolsi MRN: [id number] Date of Birth: 1961/09/28 Today's Date: 09/07/2016    Time: 830-923 53 minutes  1:1 Pt c/o 5/10 pain Lt LE, repositioned and elevated as possible.  Bed mobility in hospital bed with mod I using bed controls and hand rails, pt states he will sleep in recliner at home.  Bed <> chair transfers with mod I.  Pt practiced furniture transfers in solarium to various chairs with mod I.  Pt performed w/c mobility in home, controlled and community environments all with mod I. Distance limited by fatigue but pt able to propel up to 250' in controlled environment.  Gait with RW 25' with supervision. Supine therex for bilat LE strengthening. Pt states he feels ready to d/c home with wife tomorrow. Discussed safety, energy conservation and home set up, pt verbalizes understanding.   Kathleen Tamm 09/07/2016, 9:27 AM

## 2016-09-07 NOTE — Progress Notes (Signed)
Occupational Therapy Note  Patient Details  Name: [name] [name] MRN: [id number] Date of Birth: 1961/10/29  Today's Date: 09/07/2016 OT Individual Time: 1400-1429 OT Individual Time Calculation (min): 29 min    Pt c/o 4/10 pain in LLE at rest; RN aware Individual Therapy  Focus on continued discharge planning, BSC transfers, and education.  Pt issued theraband handout.  Pt stated he feels prepared for discharge home.   [name] [name], [name] [name] [facility] 09/07/2016, 2:30 PM

## 2016-09-07 NOTE — Discharge Instructions (Signed)
Inpatient Rehab Discharge Instructions  Donney DiceRobert Eakes Discharge date and time:  09/08/16  Activities/Precautions/ Functional Status: Activity: no lifting, driving, or strenuous exercise till cleared by MD. No weight on left leg.  Diet: diabetic diet Wound Care: Right leg--wash with soap and water. Keep clean and dry.  Pin sites: per Dr. Magdalene PatriciaHandy's instructions.  Functional status:  ___ No restrictions     ___ Walk up steps independently _X__ 24/7 supervision/assistance   ___ Walk up steps with assistance ___ Intermittent supervision/assistance  ___ Bathe/dress independently ___ Walk with walker     _X__ Bathe/dress with assistance ___ Walk Independently    ___ Shower independently ___ Walk with assistance    ___ Shower with assistance _X__ No alcohol     ___ Return to work/school ________    COMMUNITY REFERRALS UPON DISCHARGE:    Home Health:   PT     OT                      Agency: [facility] Phone: [phone]   Medical Equipment/Items Ordered: wheelchair, cushion, commode and rolling walker                                                     Agency/Supplier:  [facility] @ [phone]    Special Instructions: 1. Check blood sugars 2-3 times a day. Blood sugars need to be in 80-100 range to help your wounds heal.   My questions have been answered and I understand these instructions. I will adhere to these goals and the provided educational materials after my discharge from the hospital.  Patient/Caregiver Signature _______________________________ Date __________  Clinician Signature _______________________________________ Date __________  Please bring this form and your medication list with you to all your follow-up doctor's appointments.
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
## 2016-09-07 NOTE — Progress Notes (Signed)
Physical Therapy Discharge Summary  Patient Details  Name: [name] [name] MRN: [id number] Date of Birth: 04-10-1961   Patient has met 9 of 9 long term goals due to improved activity tolerance, improved balance, increased strength, decreased pain and ability to compensate for deficits.  Patient to discharge at an ambulatory level Supervision.  Mod I w/c level.   Patient's care partner is independent to provide the necessary supervision assistance at discharge.  Reasons goals not met: stair goal n/a as not needed for home entry, bed mobility n/a as pt plans to sleep in recliner  Recommendation:  Patient will benefit from ongoing skilled PT services in home health setting to continue to advance safe functional mobility, address ongoing impairments in strength, mobility, activity tolerance, and minimize fall risk.  Equipment: w/c, RW  Reasons for discharge: treatment goals met and discharge from hospital  Patient/family agrees with progress made and goals achieved: Yes  PT Discharge Precautions/Restrictions Precautions Precaution Comments: external fixator, rib fractures Other Brace/Splint: Rt CAM boot Restrictions Weight Bearing Restrictions: Yes RLE Weight Bearing: Weight bearing as tolerated LLE Weight Bearing: Non weight bearing Pain Pain Assessment Pain Assessment: 0-10 Pain Score: 5  Pain Location: Ankle Pain Orientation: Left Pain Descriptors / Indicators: Aching;Sharp Pain Onset: On-going Pain Frequency: Constant Pain Intervention(s): RN made aware;Elevated extremity;Repositioned  Cognition Overall Cognitive Status: Within Functional Limits for tasks assessed Arousal/Alertness: Awake/alert Orientation Level: Oriented X4 Memory: Appears intact Awareness: Appears intact Problem Solving: Appears intact Safety/Judgment: Appears intact Sensation Sensation Light Touch: Appears Intact Stereognosis: Appears Intact Hot/Cold: Appears Intact Proprioception: Appears  Intact Coordination Gross Motor Movements are Fluid and Coordinated: Yes Fine Motor Movements are Fluid and Coordinated: Yes Finger Nose Finger Test: [facility]  Motor  Motor Motor: Within Functional Limits   Trunk/Postural Assessment  Cervical Assessment Cervical Assessment: Within Functional Limits Thoracic Assessment Thoracic Assessment: Within Functional Limits Lumbar Assessment Lumbar Assessment: Within Functional Limits Postural Control Postural Control: Within Functional Limits  Balance Static Sitting Balance Static Sitting - Balance Support: No upper extremity supported Static Sitting - Level of Assistance: 7: Independent Dynamic Sitting Balance Dynamic Sitting - Balance Support: No upper extremity supported Dynamic Sitting - Level of Assistance: 6: Modified independent (Device/Increase time) Static Standing Balance Static Standing - Level of Assistance: 5: Stand by assistance Dynamic Standing Balance Dynamic Standing - Level of Assistance: 5: Stand by assistance Extremity Assessment  RUE Assessment RUE Assessment: Within Functional Limits LUE Assessment LUE Assessment: Within Functional Limits RLE Assessment RLE Assessment:  (grossly 4/5, ankle not tested) LLE Assessment LLE Assessment:  (grossly 3/5 hip and knee, ankle not tested)   See Function Navigator for Current Functional Status.  DONAWERTH,KAREN 09/07/2016, 9:23 AM

## 2016-09-07 NOTE — Progress Notes (Signed)
Physical Therapy Note  Patient Details  Name: Donney DiceRobert Ahn MRN: [id number] Date of Birth: 02/13/1961 Today's Date: 09/07/2016  Patient in bed, declined 45 min of skilled physical therapy due to report of trauma MD requesting that patient keep LE elevated, primary care team notified.   Bayard HuggerVarner, Taron Conrey A 09/07/2016, 3:05 PM

## 2016-09-07 NOTE — Progress Notes (Signed)
Occupational Therapy Session Note  Patient Details  Name: [name] DiceRobert Cerra MRN: [id number] Date of Birth: [birth year]  Today's Date: 09/07/2016 OT Individual Time: 0700-0756 OT Individual Time Calculation (min): 56 min     Short Term Goals: Week 1:  OT Short Term Goal 1 (Week 1): STGs equal to LTGs set at supervision level overall.  Skilled Therapeutic Interventions/Progress Updates:    Pt engaged in BADL retraining including bathing/dressing EOB.  Pt requires assistance doffing/donning pants secondary to external fixator on LLE.  Pt performs sit<>stand from EOB to pull up pants.  Pt requires more than a reasonable amount of time with multiple rest breaks.  Continued discharge planning.  Pt pleased with progress and states he is ready for discharge tomorrow. Therapy Documentation Precautions:  Precautions Precautions: Fall Precaution Comments: external fixator, rib fractures Required Braces or Orthoses: Other Brace/Splint Other Brace/Splint: Rt CAM boot Restrictions Weight Bearing Restrictions: Yes RLE Weight Bearing: Weight bearing as tolerated LLE Weight Bearing: Non weight bearing Pain:  4/10 at rest in bed; 7/10 with activity-repositioned  See Function Navigator for Current Functional Status.   Therapy/Group: Individual Therapy  Rich BraveLanier, [place]vada Salamone Chappell 09/07/2016, 8:00 AM

## 2016-09-07 NOTE — Progress Notes (Signed)
[facility]   Pt seen and evaluated Full consult to follow  A/P  Highly comminuted closed left intra-articular distal tibia fracture and fibula fracture  Soft tissue swelling too severe to permit formal plate osteosynthesis in near future. fx blisters also present over fibula CT still demonstrates severe valgus malalignment and shortening OR tomorrow for ex fix adjustment   Aggressive ice and elevation of extremity above heart Toe motion ok New kerlix and ace applied to foot and ankle for compression   Dc betadine for pin care  After tomorrow pin care should consist of cleaning pinsites with soap and water daily. Drying well and wrapping kerlix gauze around pins Pin tract infections occur due to excessive motion at the skin-pin interface. Primary modality for caring for pin tract infections is hygiene pts pins look ok at this point  NWB Left lower extremity  Sounds as if pt wb on ball of foot for assistance with transfers  Npo after mn  Mearl LatinKeith W. Chealsey Miyamoto, PA-C [facility] [phone] (P) 09/07/2016 3:19 PM

## 2016-09-07 NOTE — Progress Notes (Signed)
[facility] PHYSICAL MEDICINE & REHABILITATION     PROGRESS NOTE    Subjective/Complaints: No new issues. Pain perhaps a little better. Going down for ankle CT. + wound drainage  ROS: Pt denies fever, rash/itching, headache, blurred or double vision, nausea, vomiting, abdominal pain, diarrhea, chest pain, shortness of breath, palpitations, dysuria, dizziness, neck or back pain, bleeding, anxiety, or depression .    Objective: Vital Signs: Blood pressure 112/68, pulse 98, temperature 98.8 F (37.1 C), temperature source Oral, resp. rate 18, height 5\' 10"  (1.778 m), weight 132.5 kg (292 lb), SpO2 100 %. No results found.  Recent Labs  09/06/16 0621  WBC 13.0*  HGB 14.2  HCT 39.6  PLT 394   No results for input(s): NA, K, CL, GLUCOSE, BUN, CREATININE, CALCIUM in the last 72 hours.  Invalid input(s): CO CBG (last 3)   Recent Labs  09/06/16 1645 09/06/16 2036 09/07/16 0604  GLUCAP 132* 131* 141*    Wt Readings from Last 3 Encounters:  09/02/16 132.5 kg (292 lb)  01/28/13 131.5 kg (290 lb)    Physical Exam:   Constitutional: He is oriented to person, place, and time. He appears well-developed and well-nourished.  HENT:  Head: Normocephalic and atraumatic.  Mouth/Throat: Oropharynx is clear and moist.  Eyes: Conjunctivae are normal. Pupils are equal, round, and reactive to light.  Neck: Normal range of motion. Neck supple. No JVD present. No thyromegaly present.  Cardiovascular: Normal rate and regular rhythm.  Exam reveals no gallop and no friction rub.   No murmur heard. Respiratory: Effort normal and breath sounds normal. No stridor. No respiratory distress. He has no wheezes.  GI: Soft. Bowel sounds are normal. He exhibits no distension. There is no tenderness.  Musculoskeletal: He exhibits edema.  RLE with surgical compressive dressing and CAM boot . LLE with external fixator and surgical dressing, lower pin site with substantial serous drainge Neurological: He  is alert and oriented to person, place, and time. No cranial nerve deficit. Coordination normal.  Upper ext strength 5/5 LLE limited by ex-fix---can wiggle toes   RLE: 3/5 HF, KE and ADF/PF limited by boot--can move all toes.   Skin: Pin sites appear clean, gauze with some dry serosanguinous fluids Psychiatric: He has a normal mood and affect. His behavior is normal. Thought content normal.    Assessment/Plan: 1. Mobility and functional deficits secondary to bilateral ankle fractures which require 3+ hours per day of interdisciplinary therapy in a comprehensive inpatient rehab setting. Physiatrist is providing close team supervision and 24 hour management of active medical problems listed below. Physiatrist and rehab team continue to assess barriers to discharge/monitor patient progress toward functional and medical goals.  Function:  Bathing Bathing position   Position: Sitting EOB  Bathing parts Body parts bathed by patient: Right arm, Left arm, Chest, Abdomen, Front perineal area, Buttocks, Right upper leg, Left upper leg, Back    Bathing assist Assist Level: Supervision or verbal cues      Upper Body Dressing/Undressing Upper body dressing   What is the patient wearing?: Pull over shirt/dress     Pull over shirt/dress - Perfomed by patient: Thread/unthread right sleeve, Thread/unthread left sleeve, Put head through opening, Pull shirt over trunk          Upper body assist Assist Level: No help, No cues      Lower Body Dressing/Undressing Lower body dressing   What is the patient wearing?: Pants, Underwear, AFO Underwear - Performed by patient: Thread/unthread right underwear leg,  Pull underwear up/down Underwear - Performed by helper: Thread/unthread left underwear leg Pants- Performed by patient: Thread/unthread right pants leg, Pull pants up/down Pants- Performed by helper: Thread/unthread left pants leg               AFO - Performed by helper: Don/doff right  AFO      Lower body assist Assist for lower body dressing: Touching or steadying assistance (Pt > 75%)      Toileting Toileting Toileting activity did not occur: Safety/medical concerns Toileting steps completed by patient: Adjust clothing prior to toileting, Performs perineal hygiene, Adjust clothing after toileting      Toileting assist Assist level: Supervision or verbal cues   Transfers Chair/bed transfer Chair/bed transfer activity did not occur: Safety/medical concerns Chair/bed transfer method: Lateral scoot Chair/bed transfer assist level: No Help, no cues, assistive device, takes more than a reasonable amount of time Chair/bed transfer assistive device: Armrests     Locomotion Ambulation     Max distance: 25 Assist level: Supervision or verbal cues   Wheelchair   Type: Manual Max wheelchair distance: 250 Assist Level: No help, No cues, assistive device, takes more than reasonable amount of time  Cognition Comprehension Comprehension assist level: Follows complex conversation/direction with no assist  Expression Expression assist level: Expresses complex ideas: With no assist  Social Interaction Social Interaction assist level: Interacts appropriately with others - No medications needed.  Problem Solving Problem solving assist level: Solves complex problems: Recognizes & self-corrects  Memory Memory assist level: Complete Independence: No helper   Medical Problem List and Plan: 1.  Functional and mobility deficits secondary to bilateral ankle fractures/polytrauma             -continue therapies 2.  DVT Prophylaxis/Anticoagulation: Pharmaceutical: Lovenox 3. Pain Management: reduce tramadol, oxycodone prn  -consider long acting agent  -elevate leg when possible 4. Mood: LCSW to follow for evaluation and support.  5. Neuropsych: This patient is capable of making decisions on his own behalf. 6. Skin/Wound Care: continue pin care/dressings 3-4x daily as needed  -pin  site with drainage--perhaps slightly decreased  -ortho following up---down for CT now.   -wbc's trending down 7. Fluids/Electrolytes/Nutrition:  Monitor I/O.  8. T2DM---recently diagnosed: Hgb A1c- 6.3--CM diet and resumed metformin. Will monitor BS ac/hs and use SSI for elevated BS.      -reasonable control at present  9. ABLA: hgb stable  10.  Abnormal LFTs: resolved 11.  Constipation:  Increased miralax to bid  -needs bm today or tomorrow  LOS (Days) 6 A FACE TO FACE EVALUATION WAS PERFORMED  SWARTZ,ZACHARY T 09/07/2016 9:54 AM

## 2016-09-08 ENCOUNTER — Encounter (HOSPITAL_COMMUNITY): Payer: Self-pay | Admitting: Anesthesiology

## 2016-09-08 ENCOUNTER — Inpatient Hospital Stay (HOSPITAL_COMMUNITY): Payer: BLUE CROSS/BLUE SHIELD | Admitting: Anesthesiology

## 2016-09-08 ENCOUNTER — Inpatient Hospital Stay (HOSPITAL_COMMUNITY): Payer: BLUE CROSS/BLUE SHIELD

## 2016-09-08 ENCOUNTER — Inpatient Hospital Stay (HOSPITAL_COMMUNITY)
Admission: RE | Admit: 2016-09-08 | Discharge: 2016-09-14 | DRG: 494 | Disposition: A | Discharge status: Home Health | Payer: BLUE CROSS/BLUE SHIELD | Source: Ambulatory Visit | Attending: Orthopedic Surgery | Admitting: Orthopedic Surgery

## 2016-09-08 ENCOUNTER — Encounter (HOSPITAL_COMMUNITY)
Admission: RE | Disposition: A | Discharge status: Home Health | Payer: Self-pay | Source: Ambulatory Visit | Attending: Orthopedic Surgery

## 2016-09-08 DIAGNOSIS — S8251XA Displaced fracture of medial malleolus of right tibia, initial encounter for closed fracture: Secondary | ICD-10-CM | POA: Diagnosis present

## 2016-09-08 DIAGNOSIS — Z419 Encounter for procedure for purposes other than remedying health state, unspecified: Secondary | ICD-10-CM

## 2016-09-08 DIAGNOSIS — S82402A Unspecified fracture of shaft of left fibula, initial encounter for closed fracture: Principal | ICD-10-CM | POA: Diagnosis present

## 2016-09-08 DIAGNOSIS — E119 Type 2 diabetes mellitus without complications: Secondary | ICD-10-CM

## 2016-09-08 DIAGNOSIS — F17200 Nicotine dependence, unspecified, uncomplicated: Secondary | ICD-10-CM | POA: Diagnosis present

## 2016-09-08 DIAGNOSIS — S82892S Other fracture of left lower leg, sequela: Secondary | ICD-10-CM | POA: Diagnosis not present

## 2016-09-08 DIAGNOSIS — S82832A Other fracture of upper and lower end of left fibula, initial encounter for closed fracture: Secondary | ICD-10-CM | POA: Diagnosis present

## 2016-09-08 DIAGNOSIS — S82891A Other fracture of right lower leg, initial encounter for closed fracture: Secondary | ICD-10-CM

## 2016-09-08 DIAGNOSIS — S82872A Displaced pilon fracture of left tibia, initial encounter for closed fracture: Secondary | ICD-10-CM | POA: Diagnosis present

## 2016-09-08 DIAGNOSIS — S82891S Other fracture of right lower leg, sequela: Secondary | ICD-10-CM | POA: Diagnosis not present

## 2016-09-08 HISTORY — DX: Other complications of anesthesia, initial encounter: T88.59XA

## 2016-09-08 HISTORY — DX: Type 2 diabetes mellitus without complications: E11.9

## 2016-09-08 HISTORY — PX: OTHER SURGICAL HISTORY: SHX169

## 2016-09-08 HISTORY — PX: EXTERNAL FIXATION LEG: SHX1549

## 2016-09-08 HISTORY — DX: Adverse effect of unspecified anesthetic, initial encounter: T41.45XA

## 2016-09-08 LAB — GLUCOSE, CAPILLARY
GLUCOSE-CAPILLARY: 148 mg/dL — AB (ref 65–99)
GLUCOSE-CAPILLARY: 169 mg/dL — AB (ref 65–99)
GLUCOSE-CAPILLARY: 156 mg/dL — AB (ref 65–99)
GLUCOSE-CAPILLARY: 167 mg/dL — AB (ref 65–99)
Glucose-Capillary: 163 mg/dL — ABNORMAL HIGH (ref 65–99)
Glucose-Capillary: 135 mg/dL — ABNORMAL HIGH (ref 65–99)
Glucose-Capillary: 160 mg/dL — ABNORMAL HIGH (ref 65–99)
Glucose-Capillary: 154 mg/dL — ABNORMAL HIGH (ref 65–99)

## 2016-09-08 LAB — SURGICAL PCR SCREEN
MRSA, PCR: NEGATIVE
STAPHYLOCOCCUS AUREUS: POSITIVE — AB

## 2016-09-08 SURGERY — EXTERNAL FIXATION, LOWER EXTREMITY
Anesthesia: General | Site: Ankle | Laterality: Left

## 2016-09-08 MED ORDER — FENTANYL CITRATE (PF) 100 MCG/2ML IJ SOLN
INTRAMUSCULAR | Status: DC | PRN
  Administered 2016-09-08 (×2): 100 ug via INTRAVENOUS

## 2016-09-08 MED ORDER — ENSURE ENLIVE PO LIQD: 237.0000 mL | Freq: Two times a day (BID) | ORAL | Status: DC

## 2016-09-08 MED ORDER — POLYETHYLENE GLYCOL 3350 17 G PO PACK
17.0000 g | PACK | Freq: Two times a day (BID) | ORAL | Status: DC
  Administered 2016-09-08 – 2016-09-13 (×3): 17 g via ORAL
  Filled 2016-09-08 (×5): qty 1

## 2016-09-08 MED ORDER — VITAMIN B-12 1000 MCG PO TABS
1000.0000 ug | ORAL_TABLET | Freq: Every day | ORAL | Status: DC
  Administered 2016-09-09 – 2016-09-14 (×5): 1000 ug via ORAL
  Filled 2016-09-08 (×5): qty 1

## 2016-09-08 MED ORDER — METOCLOPRAMIDE HCL 5 MG/ML IJ SOLN: 5.0000 mg | Freq: Three times a day (TID) | INTRAMUSCULAR | Status: DC | PRN

## 2016-09-08 MED ORDER — MORPHINE SULFATE (PF) 2 MG/ML IV SOLN
2.0000 mg | INTRAVENOUS | Status: DC | PRN
  Administered 2016-09-08: 2 mg via INTRAVENOUS
  Filled 2016-09-08: qty 1

## 2016-09-08 MED ORDER — METFORMIN HCL 500 MG PO TABS
500.0000 mg | ORAL_TABLET | Freq: Every day | ORAL | Status: DC
  Administered 2016-09-09 – 2016-09-14 (×5): 500 mg via ORAL
  Filled 2016-09-08 (×5): qty 1

## 2016-09-08 MED ORDER — LIDOCAINE HCL (CARDIAC) 20 MG/ML IV SOLN
INTRAVENOUS | Status: DC | PRN
  Administered 2016-09-08: 50 mg via INTRAVENOUS

## 2016-09-08 MED ORDER — PROPOFOL 10 MG/ML IV BOLUS
INTRAVENOUS | Status: DC | PRN
  Administered 2016-09-08: 200 mg via INTRAVENOUS

## 2016-09-08 MED ORDER — POTASSIUM CHLORIDE IN NACL 20-0.9 MEQ/L-% IV SOLN
INTRAVENOUS | Status: DC
  Administered 2016-09-08: 16:00:00 via INTRAVENOUS
  Filled 2016-09-08: qty 1000

## 2016-09-08 MED ORDER — HYDROMORPHONE HCL 1 MG/ML IJ SOLN
0.2500 mg | INTRAMUSCULAR | Status: DC | PRN
  Administered 2016-09-08 (×4): 0.5 mg via INTRAVENOUS

## 2016-09-08 MED ORDER — OXYCODONE HCL ER 10 MG PO T12A
10.0000 mg | EXTENDED_RELEASE_TABLET | Freq: Every day | ORAL | Status: DC
  Administered 2016-09-08 – 2016-09-14 (×6): 10 mg via ORAL
  Filled 2016-09-08 (×6): qty 1

## 2016-09-08 MED ORDER — CEFAZOLIN IN D5W 1 GM/50ML IV SOLN
1.0000 g | Freq: Four times a day (QID) | INTRAVENOUS | Status: AC
  Administered 2016-09-08 – 2016-09-09 (×2): 1 g via INTRAVENOUS
  Filled 2016-09-08 (×3): qty 50

## 2016-09-08 MED ORDER — OXYCODONE HCL 5 MG PO TABS
5.0000 mg | ORAL_TABLET | Freq: Four times a day (QID) | ORAL | Status: DC | PRN
  Administered 2016-09-08 – 2016-09-11 (×4): 5 mg via ORAL
  Filled 2016-09-08 (×3): qty 1

## 2016-09-08 MED ORDER — VITAMIN B-6 100 MG PO TABS
100.0000 mg | ORAL_TABLET | Freq: Every day | ORAL | Status: DC
  Administered 2016-09-09 – 2016-09-14 (×5): 100 mg via ORAL
  Filled 2016-09-08 (×6): qty 1

## 2016-09-08 MED ORDER — GUAIFENESIN ER 600 MG PO TB12
1200.0000 mg | ORAL_TABLET | Freq: Two times a day (BID) | ORAL | Status: DC
  Administered 2016-09-08 – 2016-09-13 (×10): 1200 mg via ORAL
  Filled 2016-09-08 (×11): qty 2

## 2016-09-08 MED ORDER — ONDANSETRON HCL 4 MG/2ML IJ SOLN: 4.0000 mg | Freq: Four times a day (QID) | INTRAMUSCULAR | Status: DC | PRN

## 2016-09-08 MED ORDER — HYDROMORPHONE HCL 1 MG/ML IJ SOLN
INTRAMUSCULAR | Status: AC
  Filled 2016-09-08: qty 1

## 2016-09-08 MED ORDER — PHENYLEPHRINE HCL 10 MG/ML IJ SOLN
INTRAMUSCULAR | Status: DC | PRN
  Administered 2016-09-08: 80 ug via INTRAVENOUS

## 2016-09-08 MED ORDER — ACETAMINOPHEN 650 MG RE SUPP: 650.0000 mg | Freq: Four times a day (QID) | RECTAL | Status: DC | PRN

## 2016-09-08 MED ORDER — SODIUM CHLORIDE 0.9% FLUSH: 3.0000 mL | INTRAVENOUS | Status: DC | PRN

## 2016-09-08 MED ORDER — METHOCARBAMOL 500 MG PO TABS
500.0000 mg | ORAL_TABLET | Freq: Four times a day (QID) | ORAL | Status: DC | PRN
  Administered 2016-09-08 – 2016-09-10 (×3): 500 mg via ORAL
  Filled 2016-09-08 (×2): qty 1

## 2016-09-08 MED ORDER — METOCLOPRAMIDE HCL 5 MG PO TABS: 5.0000 mg | ORAL_TABLET | Freq: Three times a day (TID) | ORAL | Status: DC | PRN

## 2016-09-08 MED ORDER — ACETAMINOPHEN 500 MG PO TABS
1000.0000 mg | ORAL_TABLET | Freq: Four times a day (QID) | ORAL | Status: DC
  Administered 2016-09-08 – 2016-09-14 (×20): 1000 mg via ORAL
  Filled 2016-09-08 (×19): qty 2

## 2016-09-08 MED ORDER — OXYCODONE HCL 5 MG PO TABS
ORAL_TABLET | ORAL | Status: AC
  Filled 2016-09-08: qty 1

## 2016-09-08 MED ORDER — SODIUM CHLORIDE 0.9% FLUSH: 3.0000 mL | Freq: Two times a day (BID) | INTRAVENOUS | Status: DC

## 2016-09-08 MED ORDER — INSULIN ASPART 100 UNIT/ML ~~LOC~~ SOLN
0.0000 [IU] | Freq: Three times a day (TID) | SUBCUTANEOUS | Status: DC
  Administered 2016-09-08 – 2016-09-09 (×2): 3 [IU] via SUBCUTANEOUS
  Administered 2016-09-09: 5 [IU] via SUBCUTANEOUS
  Administered 2016-09-10: 3 [IU] via SUBCUTANEOUS
  Administered 2016-09-10 (×2): 2 [IU] via SUBCUTANEOUS
  Administered 2016-09-11: 3 [IU] via SUBCUTANEOUS
  Administered 2016-09-11 (×2): 2 [IU] via SUBCUTANEOUS
  Administered 2016-09-12 – 2016-09-13 (×2): 3 [IU] via SUBCUTANEOUS
  Administered 2016-09-13 – 2016-09-14 (×2): 2 [IU] via SUBCUTANEOUS
  Administered 2016-09-14: 3 [IU] via SUBCUTANEOUS

## 2016-09-08 MED ORDER — ADULT MULTIVITAMIN W/MINERALS CH
1.0000 | ORAL_TABLET | Freq: Every day | ORAL | Status: DC
  Administered 2016-09-09 – 2016-09-14 (×5): 1 via ORAL
  Filled 2016-09-08 (×5): qty 1

## 2016-09-08 MED ORDER — ACETAMINOPHEN 325 MG PO TABS: 650.0000 mg | ORAL_TABLET | Freq: Four times a day (QID) | ORAL | Status: DC | PRN

## 2016-09-08 MED ORDER — SODIUM CHLORIDE 0.9 % IV SOLN: 250.0000 mL | INTRAVENOUS | Status: DC | PRN

## 2016-09-08 MED ORDER — LACTATED RINGERS IV SOLN
INTRAVENOUS | Status: DC | PRN
  Administered 2016-09-08: 10:00:00 via INTRAVENOUS

## 2016-09-08 MED ORDER — METHOCARBAMOL 500 MG PO TABS
ORAL_TABLET | ORAL | Status: AC
  Filled 2016-09-08: qty 1

## 2016-09-08 MED ORDER — ENOXAPARIN SODIUM 30 MG/0.3ML ~~LOC~~ SOLN
30.0000 mg | Freq: Two times a day (BID) | SUBCUTANEOUS | Status: DC
  Administered 2016-09-08 – 2016-09-14 (×11): 30 mg via SUBCUTANEOUS
  Filled 2016-09-08 (×12): qty 0.3

## 2016-09-08 MED ORDER — ENOXAPARIN SODIUM 30 MG/0.3ML ~~LOC~~ SOLN: 30.0000 mg | Freq: Two times a day (BID) | SUBCUTANEOUS | Status: DC

## 2016-09-08 MED ORDER — MORPHINE SULFATE (PF) 2 MG/ML IV SOLN
2.0000 mg | INTRAVENOUS | Status: DC | PRN
  Administered 2016-09-08 (×2): 2 mg via INTRAVENOUS
  Administered 2016-09-09: 3 mg via INTRAVENOUS
  Administered 2016-09-10: 2 mg via INTRAVENOUS
  Administered 2016-09-10: 3 mg via INTRAVENOUS
  Administered 2016-09-11 – 2016-09-13 (×3): 2 mg via INTRAVENOUS
  Administered 2016-09-13: 3 mg via INTRAVENOUS
  Filled 2016-09-08: qty 1
  Filled 2016-09-08 (×2): qty 2
  Filled 2016-09-08: qty 1
  Filled 2016-09-08: qty 2
  Filled 2016-09-08 (×4): qty 1
  Filled 2016-09-08 (×2): qty 2

## 2016-09-08 MED ORDER — ONDANSETRON HCL 4 MG PO TABS: 4.0000 mg | ORAL_TABLET | Freq: Four times a day (QID) | ORAL | Status: DC | PRN

## 2016-09-08 MED ORDER — DOCUSATE SODIUM 100 MG PO CAPS
100.0000 mg | ORAL_CAPSULE | Freq: Two times a day (BID) | ORAL | Status: DC
  Administered 2016-09-08 – 2016-09-13 (×4): 100 mg via ORAL
  Filled 2016-09-08 (×7): qty 1

## 2016-09-08 MED ORDER — PROMETHAZINE HCL 25 MG/ML IJ SOLN: 6.2500 mg | INTRAMUSCULAR | Status: DC | PRN

## 2016-09-08 MED ORDER — LACTATED RINGERS IV SOLN
INTRAVENOUS | Status: DC
  Administered 2016-09-08: 10:00:00 via INTRAVENOUS

## 2016-09-08 SURGICAL SUPPLY — 65 items
BANDAGE ACE 3X5.8 VEL STRL LF (GAUZE/BANDAGES/DRESSINGS) ×9 IMPLANT
BANDAGE ACE 4X5 VEL STRL LF (GAUZE/BANDAGES/DRESSINGS) IMPLANT
BANDAGE ACE 6X5 VEL STRL LF (GAUZE/BANDAGES/DRESSINGS) IMPLANT
BANDAGE ESMARK 6X9 LF (GAUZE/BANDAGES/DRESSINGS) IMPLANT
BNDG COHESIVE 6X5 TAN STRL LF (GAUZE/BANDAGES/DRESSINGS) IMPLANT
BNDG CONFORM 3 STRL LF (GAUZE/BANDAGES/DRESSINGS) ×6 IMPLANT
BNDG ESMARK 6X9 LF (GAUZE/BANDAGES/DRESSINGS)
BNDG GAUZE ELAST 4 BULKY (GAUZE/BANDAGES/DRESSINGS) ×3 IMPLANT
BRUSH SCRUB DISP (MISCELLANEOUS) IMPLANT
CLEANER TIP ELECTROSURG 2X2 (MISCELLANEOUS) IMPLANT
CLOSURE WOUND 1/2 X4 (GAUZE/BANDAGES/DRESSINGS)
COVER SURGICAL LIGHT HANDLE (MISCELLANEOUS) IMPLANT
CUFF TOURNIQUET SINGLE 18IN (TOURNIQUET CUFF) IMPLANT
CUFF TOURNIQUET SINGLE 24IN (TOURNIQUET CUFF) IMPLANT
CUFF TOURNIQUET SINGLE 34IN LL (TOURNIQUET CUFF) IMPLANT
DRAPE C-ARM 42X72 X-RAY (DRAPES) IMPLANT
DRAPE C-ARMOR (DRAPES) IMPLANT
DRAPE U-SHAPE 47X51 STRL (DRAPES) IMPLANT
DRSG ADAPTIC 3X8 NADH LF (GAUZE/BANDAGES/DRESSINGS) IMPLANT
DRSG MEPITEL 4X7.2 (GAUZE/BANDAGES/DRESSINGS) ×3 IMPLANT
ELECT REM PT RETURN 9FT ADLT (ELECTROSURGICAL)
ELECTRODE REM PT RTRN 9FT ADLT (ELECTROSURGICAL) IMPLANT
EVACUATOR 1/8 PVC DRAIN (DRAIN) IMPLANT
GAUZE SPONGE 4X4 12PLY STRL (GAUZE/BANDAGES/DRESSINGS) ×3 IMPLANT
GLOVE BIO SURGEON STRL SZ7.5 (GLOVE) IMPLANT
GLOVE BIO SURGEON STRL SZ8 (GLOVE) IMPLANT
GLOVE BIOGEL PI IND STRL 7.5 (GLOVE) IMPLANT
GLOVE BIOGEL PI IND STRL 8 (GLOVE) IMPLANT
GLOVE BIOGEL PI INDICATOR 7.5 (GLOVE)
GLOVE BIOGEL PI INDICATOR 8 (GLOVE)
GOWN STRL REUS W/ TWL LRG LVL3 (GOWN DISPOSABLE) IMPLANT
GOWN STRL REUS W/ TWL XL LVL3 (GOWN DISPOSABLE) IMPLANT
GOWN STRL REUS W/TWL LRG LVL3 (GOWN DISPOSABLE)
GOWN STRL REUS W/TWL XL LVL3 (GOWN DISPOSABLE)
HANDPIECE INTERPULSE COAX TIP (DISPOSABLE)
KIT BASIN OR (CUSTOM PROCEDURE TRAY) IMPLANT
KIT ROOM TURNOVER OR (KITS) ×3 IMPLANT
MANIFOLD NEPTUNE II (INSTRUMENTS) IMPLANT
NEEDLE 22X1 1/2 (OR ONLY) (NEEDLE) IMPLANT
NS IRRIG 1000ML POUR BTL (IV SOLUTION) IMPLANT
PACK ORTHO EXTREMITY (CUSTOM PROCEDURE TRAY) IMPLANT
PAD ARMBOARD 7.5X6 YLW CONV (MISCELLANEOUS) ×3 IMPLANT
PADDING CAST COTTON 6X4 STRL (CAST SUPPLIES) IMPLANT
SET HNDPC FAN SPRY TIP SCT (DISPOSABLE) IMPLANT
SPONGE GAUZE 4X4 12PLY STER LF (GAUZE/BANDAGES/DRESSINGS) ×3 IMPLANT
SPONGE LAP 18X18 X RAY DECT (DISPOSABLE) IMPLANT
SPONGE SCRUB IODOPHOR (GAUZE/BANDAGES/DRESSINGS) IMPLANT
STAPLER VISISTAT 35W (STAPLE) IMPLANT
STOCKINETTE IMPERVIOUS LG (DRAPES) IMPLANT
STRIP CLOSURE SKIN 1/2X4 (GAUZE/BANDAGES/DRESSINGS) IMPLANT
SUCTION FRAZIER HANDLE 10FR (MISCELLANEOUS)
SUCTION TUBE FRAZIER 10FR DISP (MISCELLANEOUS) IMPLANT
SUT ETHILON 3 0 PS 1 (SUTURE) IMPLANT
SUT VIC AB 0 CT1 27 (SUTURE)
SUT VIC AB 0 CT1 27XBRD ANBCTR (SUTURE) IMPLANT
SUT VIC AB 2-0 CT1 27 (SUTURE)
SUT VIC AB 2-0 CT1 TAPERPNT 27 (SUTURE) IMPLANT
SYR CONTROL 10ML LL (SYRINGE) IMPLANT
TOWEL OR 17X24 6PK STRL BLUE (TOWEL DISPOSABLE) IMPLANT
TOWEL OR 17X26 10 PK STRL BLUE (TOWEL DISPOSABLE) IMPLANT
TUBE CONNECTING 12'X1/4 (SUCTIONS)
TUBE CONNECTING 12X1/4 (SUCTIONS) IMPLANT
UNDERPAD 30X30 (UNDERPADS AND DIAPERS) IMPLANT
WATER STERILE IRR 1000ML POUR (IV SOLUTION) IMPLANT
YANKAUER SUCT BULB TIP NO VENT (SUCTIONS) IMPLANT

## 2016-09-08 NOTE — Progress Notes (Signed)
Orthopedic Tech Progress Note Patient Details:  Arlis Cassels November 02, 1961 [id number] [id number] OHF w/ Trapeze. Patient ID: Donney DiceRobert Scheffel, male   DOB: November 02, 1961, 55 y.o.   MRN: [id number]   Clois Dupesvery S Tanysha Quant 09/08/2016, 4:33 PM

## 2016-09-08 NOTE — H&P (Signed)
Orthopaedic Trauma Service H&P  Please see consult completed yesterday  Pt taken to OR today for Ex fix adjustment to improve length and alignment of his complex intra-articular L distal tibia and fibula fracture  His soft tissue swelling is much improved compared to yesterday  With further aggressive soft tissue management we feel that we can perform definitive fixation early next week. Pt is already 2 weeks post injury. Further aggressive soft tissue management may allow formal ORIF, which would give pt best chance of getting back to pre-injury function. Otherwise pt would remain in fixator for another 6 weeks and would then likely require tibiotalar fusion.  Admission back to [facility] is felt to be medically necessary to optimize pt outcome and minimize further risk  Pt will be NWB B LEx Aggressive ice and elevation of L leg above level of the heart. Toe motion   Plan for definitive fixation 09/12/2016 Would anticipate 2-3 day hospital stay after definitive fixation. Anticipate dc home 09/15/2016 Pt completed/achieved all goals with CIR His activity level/WB status will not change post op   Mearl LatinKeith W. Lakeeta Dobosz, PA-C [facility] [phone] (P) 09/08/2016 12:36 PM
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
## 2016-09-08 NOTE — Anesthesia Preprocedure Evaluation (Addendum)
Anesthesia Evaluation  Patient identified by MRN, date of birth, ID band Patient awake    Reviewed: Allergy & Precautions, NPO status , Patient's Chart, lab work & pertinent test results  Airway Mallampati: II  TM Distance: >3 FB Neck ROM: Full    Dental no notable dental hx. (+) Teeth Intact, Dental Advidsory Given   Pulmonary neg pulmonary ROS, Current Smoker,    Pulmonary exam normal breath sounds clear to auscultation       Cardiovascular negative cardio ROS Normal cardiovascular exam Rhythm:Regular Rate:Normal     Neuro/Psych negative neurological ROS  negative psych ROS   GI/Hepatic negative GI ROS, Neg liver ROS,   Endo/Other  diabetesMorbid obesity  Renal/GU negative Renal ROS  negative genitourinary   Musculoskeletal negative musculoskeletal ROS (+)   Abdominal   Peds negative pediatric ROS (+)  Hematology negative hematology ROS (+)   Anesthesia Other Findings   Reproductive/Obstetrics negative OB ROS                            Anesthesia Physical Anesthesia Plan  ASA: II  Anesthesia Plan: General   Post-op Pain Management:    Induction: Intravenous  Airway Management Planned: LMA  Additional Equipment:   Intra-op Plan:   Post-operative Plan: Extubation in OR  Informed Consent: I have reviewed the patients History and Physical, chart, labs and discussed the procedure including the risks, benefits and alternatives for the proposed anesthesia with the patient or authorized representative who has indicated his/her understanding and acceptance.   Dental advisory given  Plan Discussed with: Anesthesiologist  Anesthesia Plan Comments:        Anesthesia Quick Evaluation

## 2016-09-08 NOTE — Anesthesia Procedure Notes (Signed)
Procedure Name: LMA Insertion Date/Time: 09/08/2016 10:48 AM Performed by: Carmela RimaMARTINELLI, Shanelle Clontz F Pre-anesthesia Checklist: Timeout performed, Patient being monitored, Suction available, Emergency Drugs available and Patient identified Patient Re-evaluated:Patient Re-evaluated prior to inductionOxygen Delivery Method: Circle system utilized Preoxygenation: Pre-oxygenation with 100% oxygen Intubation Type: IV induction Ventilation: Mask ventilation without difficulty LMA: LMA inserted LMA Size: 5.0 Number of attempts: 1 Placement Confirmation: breath sounds checked- equal and bilateral and positive ETCO2 Tube secured with: Tape Dental Injury: Teeth and Oropharynx as per pre-operative assessment

## 2016-09-08 NOTE — Progress Notes (Signed)
[facility]. Ancef initiated per order on call to OR. PT remains NPO for procedure and all sclothes off except for gown. [name] HoitSharp, Burman Bruington B

## 2016-09-08 NOTE — Care Management Note (Signed)
Case Management Note  Patient Details  Name: [name] DiceRobert [name] MRN: [id number] Date of Birth: [birth year]  Subjective/Objective:                    Action/Plan:  Plan for definitive fixation 09/12/2016 Would anticipate 2-3 day hospital stay after definitive fixation. Anticipate dc home 09/15/2016  Await post op PT eval  Expected Discharge Date:                  Expected Discharge Plan:     In-House Referral:     Discharge planning Services  CM Consult  Post Acute Care Choice:  Home Health Choice offered to:     DME Arranged:    DME Agency:     HH Arranged:    HH Agency:     Status of Service:  In process, will continue to follow  If discussed at Long Length of Stay Meetings, dates discussed:    Additional Comments:  Kingsley PlanWile, [name] Orourke Marie, RN 09/08/2016, 2:49 PM

## 2016-09-08 NOTE — Transfer of Care (Signed)
Immediate Anesthesia Transfer of Care Note  Patient: [name] DiceRobert [name]  Procedure(s) Performed: Procedure(s): EXTERNAL FIXATION LEG adjustment (Left)  Patient Location: PACU  Anesthesia Type:General  Level of Consciousness: awake, alert  and oriented  Airway & Oxygen Therapy: Patient Spontanous Breathing and Patient connected to nasal cannula oxygen  Post-op Assessment: Report given to RN, Post -op Vital signs reviewed and stable and Patient moving all extremities X 4  Post vital signs: Reviewed and stable  Last Vitals: There were no vitals filed for this visit.  Last Pain: There were no vitals filed for this visit.       Complications: No apparent anesthesia complications

## 2016-09-09 LAB — GLUCOSE, CAPILLARY
GLUCOSE-CAPILLARY: 221 mg/dL — AB (ref 65–99)
GLUCOSE-CAPILLARY: 102 mg/dL — AB (ref 65–99)
GLUCOSE-CAPILLARY: 163 mg/dL — AB (ref 65–99)
GLUCOSE-CAPILLARY: 169 mg/dL — AB (ref 65–99)

## 2016-09-09 LAB — AEROBIC CULTURE W GRAM STAIN (SUPERFICIAL SPECIMEN)

## 2016-09-09 LAB — AEROBIC CULTURE  (SUPERFICIAL SPECIMEN)

## 2016-09-09 NOTE — Progress Notes (Signed)
PT Cancellation Note  Patient Details Name: [name] DiceRobert [name] MRN: [id number] DOB: [birth year]   Cancelled Treatment:    Reason Eval/Treat Not Completed: Pain limiting ability to participate.  Attempted to see patient this am.  Patient reports increased pain and politely declined PT.  Will return this pm to attempt PT eval.   Vena AustriaDavis, [name] 09/09/2016, 11:37 AM Durenda HurtSusan H. Renaldo Fiddleravis, PT, [facility] Acute Rehab Services Pager [phone]

## 2016-09-09 NOTE — Progress Notes (Signed)
Subjective: 1 Day Post-Op Procedure(s) (LRB): EXTERNAL FIXATION LEG adjustment (Left) Patient reports pain as mild and moderate.    Objective: Vital signs in last 24 hours: Temp:  [97.8 F (36.6 C)-98.7 F (37.1 C)] 98.4 F (36.9 C) (09/30 0535) Pulse Rate:  [80-97] 84 (09/30 0535) Resp:  [14-28] 19 (09/30 0535) BP: (106-154)/(66-96) 132/80 (09/30 0535) SpO2:  [94 %-100 %] 94 % (09/30 0535) Weight:  [132.5 kg (292 lb)] 132.5 kg (292 lb) (09/29 1800)  Intake/Output from previous day: 09/29 0701 - 09/30 0700 In: 420 [P.O.:120; I.V.:300] Out: 1501 [Urine:1500; Blood:1] Intake/Output this shift: Total I/O In: -  Out: 400 [Urine:400]  No results for input(s): HGB in the last 72 hours. No results for input(s): WBC, RBC, HCT, PLT in the last 72 hours. No results for input(s): NA, K, CL, CO2, BUN, CREATININE, GLUCOSE, CALCIUM in the last 72 hours. No results for input(s): LABPT, INR in the last 72 hours.  Neurovascular intact Sensation intact distally Intact pulses distally Incision: dressing C/D/I Compartment soft  Wiggles toes, DF/PF ankle on R intact well healing surgical incision R medial ankle sutures in place  Assessment/Plan: 1 Day Post-Op Procedure(s) (LRB): EXTERNAL FIXATION LEG adjustment (Left)  NWB bilat LE legs elevate to optimize swelling prior to surgery Pain control as ordered lovenox dvt proph  Plan for definitive fixation 09/12/2016 Would anticipate 2-3 day hospital stay after definitive fixation. Anticipate dc home 09/15/2016 Pt completed/achieved all goals with CIR His activity level/WB status will not change post op   Kellye Mizner, Ivin BootyJoshua 09/09/2016, 9:27 AM

## 2016-09-09 NOTE — Progress Notes (Signed)
PT Cancellation Note  Patient Details Name: [name] DiceRobert [name] MRN: [id number] DOB: 04-Dec-1961   Cancelled Treatment:    Reason Eval/Treat Not Completed: Pain limiting ability to participate.  Patient again declined PT this pm due to pain.  Will return tomorrow for PT evaluation.   Vena AustriaDavis, [name] 09/09/2016, 3:43 PM Durenda HurtSusan H. Renaldo Fiddleravis, PT, [facility] Acute Rehab Services Pager [phone]

## 2016-09-09 NOTE — Progress Notes (Signed)
OT Cancellation Note  Patient Details Name: [name] [name] MRN: [id number] DOB: Sep 15, 1961   Cancelled Treatment:    Reason Eval/Treat Not Completed: Pain limiting ability to participate. Pt reports increased pain and politely declined OT and requested return later this afternoon. OT will check back later as able  Galen ManilaSpencer, Naje Rice Jeanette 09/09/2016, 11:40 AM
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
## 2016-09-10 LAB — GLUCOSE, CAPILLARY
GLUCOSE-CAPILLARY: 135 mg/dL — AB (ref 65–99)
GLUCOSE-CAPILLARY: 159 mg/dL — AB (ref 65–99)
Glucose-Capillary: 134 mg/dL — ABNORMAL HIGH (ref 65–99)
Glucose-Capillary: 193 mg/dL — ABNORMAL HIGH (ref 65–99)
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
## 2016-09-10 NOTE — Progress Notes (Signed)
Subjective: 2 Days Post-Op Procedure(s) (LRB): EXTERNAL FIXATION LEG adjustment (Left) Patient reports pain as mild and moderate.    Objective: Vital signs in last 24 hours: Temp:  [98.3 F (36.8 C)] 98.3 F (36.8 C) (10/01 0642) Pulse Rate:  [92-101] 92 (10/01 0642) Resp:  [17-18] 17 (10/01 0642) BP: (136-143)/(86-90) 138/90 (10/01 0642) SpO2:  [98 %-100 %] 98 % (10/01 0642)  Intake/Output from previous day: 09/30 0701 - 10/01 0700 In: 1140 [P.O.:920; I.V.:220] Out: 2550 [Urine:2550] Intake/Output this shift: No intake/output data recorded.  No results for input(s): HGB in the last 72 hours. No results for input(s): WBC, RBC, HCT, PLT in the last 72 hours. No results for input(s): NA, K, CL, CO2, BUN, CREATININE, GLUCOSE, CALCIUM in the last 72 hours. No results for input(s): LABPT, INR in the last 72 hours.  Neurovascular intact Sensation intact distally Incision: dressing C/D/I  Assessment/Plan: 2 Days Post-Op Procedure(s) (LRB): EXTERNAL FIXATION LEG adjustment (Left) NWB bilat LE legs elevate and ice to optimize swelling prior to surgery Pain control as ordered lovenox dvt proph  Plan for definitive fixation 09/12/2016 Would anticipate 2-3 day hospital stay after definitive fixation. Anticipate dc home 09/15/2016 Pt completed/achieved all goals with CIR His activity level/WB status will not change post op   [name] [name], [name] [name] 09/10/2016, 9:38 AM

## 2016-09-11 ENCOUNTER — Encounter (HOSPITAL_COMMUNITY): Payer: Self-pay | Admitting: Orthopedic Surgery

## 2016-09-11 LAB — GLUCOSE, CAPILLARY
GLUCOSE-CAPILLARY: 157 mg/dL — AB (ref 65–99)
GLUCOSE-CAPILLARY: 139 mg/dL — AB (ref 65–99)
Glucose-Capillary: 146 mg/dL — ABNORMAL HIGH (ref 65–99)
Glucose-Capillary: 200 mg/dL — ABNORMAL HIGH (ref 65–99)

## 2016-09-11 LAB — APTT: APTT: 32 s (ref 24–36)

## 2016-09-11 MED ORDER — CEFAZOLIN SODIUM-DEXTROSE 2-4 GM/100ML-% IV SOLN
2.0000 g | Freq: Once | INTRAVENOUS | Status: AC
  Administered 2016-09-12 (×2): 2 g via INTRAVENOUS
  Filled 2016-09-11: qty 100
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
## 2016-09-12 ENCOUNTER — Inpatient Hospital Stay (HOSPITAL_COMMUNITY): Payer: BLUE CROSS/BLUE SHIELD

## 2016-09-12 ENCOUNTER — Encounter (HOSPITAL_COMMUNITY): Payer: Self-pay | Admitting: Certified Registered Nurse Anesthetist

## 2016-09-12 ENCOUNTER — Inpatient Hospital Stay (HOSPITAL_COMMUNITY): Payer: BLUE CROSS/BLUE SHIELD | Admitting: Certified Registered"

## 2016-09-12 ENCOUNTER — Encounter (HOSPITAL_COMMUNITY)
Admission: RE | Disposition: A | Discharge status: Home Health | Payer: Self-pay | Source: Ambulatory Visit | Attending: Orthopedic Surgery

## 2016-09-12 HISTORY — PX: EXTERNAL FIXATION LEG: SHX1549

## 2016-09-12 HISTORY — PX: ORIF TIBIA FRACTURE: SHX5416

## 2016-09-12 LAB — PROTIME-INR
INR: 1.08
Prothrombin Time: 14 seconds (ref 11.4–15.2)

## 2016-09-12 LAB — COMPREHENSIVE METABOLIC PANEL
ALBUMIN: 3.3 g/dL — AB (ref 3.5–5.0)
ALK PHOS: 76 U/L (ref 38–126)
ALT: 39 U/L (ref 17–63)
ANION GAP: 4 — AB (ref 5–15)
AST: 36 U/L (ref 15–41)
BUN: 11 mg/dL (ref 6–20)
CALCIUM: 9.2 mg/dL (ref 8.9–10.3)
CHLORIDE: 106 mmol/L (ref 101–111)
CO2: 28 mmol/L (ref 22–32)
Creatinine, Ser: 0.89 mg/dL (ref 0.61–1.24)
GFR calc Af Amer: >60 mL/min (ref >60)
GFR calc non Af Amer: >60 mL/min (ref >60)
GLUCOSE: 123 mg/dL — AB (ref 65–99)
Potassium: 4.4 mmol/L (ref 3.5–5.1)
SODIUM: 138 mmol/L (ref 135–145)
Total Bilirubin: 0.7 mg/dL (ref 0.3–1.2)
Total Protein: 6.8 g/dL (ref 6.5–8.1)

## 2016-09-12 LAB — CBC
HEMATOCRIT: 41.9 % (ref 39.0–52.0)
HEMOGLOBIN: 14.9 g/dL (ref 13.0–17.0)
MCH: 31.4 pg (ref 26.0–34.0)
MCHC: 35.6 g/dL (ref 30.0–36.0)
MCV: 88.2 fL (ref 78.0–100.0)
Platelets: 382 10*3/uL (ref 150–400)
RBC: 4.75 MIL/uL (ref 4.22–5.81)
RDW: 13.4 % (ref 11.5–15.5)
WBC: 10.4 10*3/uL (ref 4.0–10.5)

## 2016-09-12 LAB — TYPE AND SCREEN
ABO/RH(D): A POS
Antibody Screen: NEGATIVE

## 2016-09-12 LAB — ABO/RH: ABO/RH(D): A POS

## 2016-09-12 LAB — GLUCOSE, CAPILLARY
GLUCOSE-CAPILLARY: 147 mg/dL — AB (ref 65–99)
GLUCOSE-CAPILLARY: 152 mg/dL — AB (ref 65–99)
GLUCOSE-CAPILLARY: 167 mg/dL — AB (ref 65–99)
Glucose-Capillary: 191 mg/dL — ABNORMAL HIGH (ref 65–99)

## 2016-09-12 SURGERY — OPEN REDUCTION INTERNAL FIXATION (ORIF) TIBIA FRACTURE
Anesthesia: General | Site: Leg Lower | Laterality: Left

## 2016-09-12 MED ORDER — SUGAMMADEX SODIUM 500 MG/5ML IV SOLN
INTRAVENOUS | Status: DC | PRN
  Administered 2016-09-12: 500 mg via INTRAVENOUS

## 2016-09-12 MED ORDER — ARTIFICIAL TEARS OP OINT
TOPICAL_OINTMENT | OPHTHALMIC | Status: DC | PRN
  Administered 2016-09-12: 1 via OPHTHALMIC

## 2016-09-12 MED ORDER — METOCLOPRAMIDE HCL 5 MG PO TABS: 5.0000 mg | ORAL_TABLET | Freq: Three times a day (TID) | ORAL | Status: DC | PRN

## 2016-09-12 MED ORDER — METOPROLOL TARTARATE 1 MG/ML SYRINGE (5ML)
Status: DC | PRN
  Administered 2016-09-12 (×6): 2.5 mg via INTRAVENOUS

## 2016-09-12 MED ORDER — SUGAMMADEX SODIUM 500 MG/5ML IV SOLN
INTRAVENOUS | Status: AC
  Filled 2016-09-12: qty 5

## 2016-09-12 MED ORDER — WARFARIN - PHARMACIST DOSING INPATIENT
Freq: Every day | Status: DC
  Administered 2016-09-12: 18:00:00

## 2016-09-12 MED ORDER — PROPOFOL 10 MG/ML IV BOLUS
INTRAVENOUS | Status: AC
  Filled 2016-09-12: qty 20

## 2016-09-12 MED ORDER — PROMETHAZINE HCL 25 MG/ML IJ SOLN: 6.2500 mg | INTRAMUSCULAR | Status: DC | PRN

## 2016-09-12 MED ORDER — SUCCINYLCHOLINE CHLORIDE 20 MG/ML IJ SOLN
INTRAMUSCULAR | Status: DC | PRN
  Administered 2016-09-12: 140 mg via INTRAVENOUS

## 2016-09-12 MED ORDER — MIDAZOLAM HCL 2 MG/2ML IJ SOLN
INTRAMUSCULAR | Status: AC
  Filled 2016-09-12: qty 2

## 2016-09-12 MED ORDER — ROCURONIUM BROMIDE 10 MG/ML (PF) SYRINGE
PREFILLED_SYRINGE | INTRAVENOUS | Status: AC
  Filled 2016-09-12: qty 20

## 2016-09-12 MED ORDER — HYDROMORPHONE HCL 1 MG/ML IJ SOLN
INTRAMUSCULAR | Status: AC
  Administered 2016-09-12: 0.5 mg via INTRAVENOUS
  Filled 2016-09-12: qty 1

## 2016-09-12 MED ORDER — KETAMINE HCL 10 MG/ML IJ SOLN
INTRAMUSCULAR | Status: DC | PRN
  Administered 2016-09-12 (×3): 20 mg via INTRAVENOUS

## 2016-09-12 MED ORDER — HYDROMORPHONE HCL 1 MG/ML IJ SOLN
INTRAMUSCULAR | Status: AC
  Filled 2016-09-12: qty 1

## 2016-09-12 MED ORDER — FENTANYL CITRATE (PF) 100 MCG/2ML IJ SOLN
INTRAMUSCULAR | Status: AC
  Filled 2016-09-12: qty 4

## 2016-09-12 MED ORDER — ONDANSETRON HCL 4 MG/2ML IJ SOLN: 4.0000 mg | Freq: Four times a day (QID) | INTRAMUSCULAR | Status: DC | PRN

## 2016-09-12 MED ORDER — CHLORHEXIDINE GLUCONATE CLOTH 2 % EX PADS: 6.0000 | MEDICATED_PAD | Freq: Every day | CUTANEOUS | Status: DC

## 2016-09-12 MED ORDER — POTASSIUM CHLORIDE IN NACL 20-0.9 MEQ/L-% IV SOLN
INTRAVENOUS | Status: DC
  Administered 2016-09-12 (×2): via INTRAVENOUS
  Filled 2016-09-12 (×2): qty 1000

## 2016-09-12 MED ORDER — KETAMINE HCL-SODIUM CHLORIDE 100-0.9 MG/10ML-% IV SOSY
PREFILLED_SYRINGE | INTRAVENOUS | Status: AC
  Filled 2016-09-12: qty 10

## 2016-09-12 MED ORDER — ONDANSETRON HCL 4 MG/2ML IJ SOLN
INTRAMUSCULAR | Status: AC
  Filled 2016-09-12: qty 2

## 2016-09-12 MED ORDER — METOCLOPRAMIDE HCL 5 MG/ML IJ SOLN: 5.0000 mg | Freq: Three times a day (TID) | INTRAMUSCULAR | Status: DC | PRN

## 2016-09-12 MED ORDER — CEFAZOLIN SODIUM 1 G IJ SOLR
INTRAMUSCULAR | Status: AC
  Filled 2016-09-12: qty 20

## 2016-09-12 MED ORDER — PROPOFOL 10 MG/ML IV BOLUS
INTRAVENOUS | Status: DC | PRN
  Administered 2016-09-12: 25 mg via INTRAVENOUS
  Administered 2016-09-12: 50 mg via INTRAVENOUS
  Administered 2016-09-12: 100 mg via INTRAVENOUS
  Administered 2016-09-12: 200 mg via INTRAVENOUS
  Administered 2016-09-12: 25 mg via INTRAVENOUS

## 2016-09-12 MED ORDER — HYDROMORPHONE HCL 1 MG/ML IJ SOLN
0.2500 mg | INTRAMUSCULAR | Status: DC | PRN
  Administered 2016-09-12 (×4): 0.5 mg via INTRAVENOUS

## 2016-09-12 MED ORDER — PHENYLEPHRINE HCL 10 MG/ML IJ SOLN
INTRAVENOUS | Status: DC | PRN
  Administered 2016-09-12: 50 ug/min via INTRAVENOUS
  Administered 2016-09-12: 13:00:00 via INTRAVENOUS

## 2016-09-12 MED ORDER — ONDANSETRON HCL 4 MG PO TABS: 4.0000 mg | ORAL_TABLET | Freq: Four times a day (QID) | ORAL | Status: DC | PRN

## 2016-09-12 MED ORDER — ROCURONIUM BROMIDE 10 MG/ML (PF) SYRINGE
PREFILLED_SYRINGE | INTRAVENOUS | Status: AC
  Filled 2016-09-12: qty 10

## 2016-09-12 MED ORDER — LIDOCAINE HCL (CARDIAC) 20 MG/ML IV SOLN
INTRAVENOUS | Status: DC | PRN
  Administered 2016-09-12: 60 mg via INTRATRACHEAL

## 2016-09-12 MED ORDER — METOPROLOL TARTRATE 5 MG/5ML IV SOLN
INTRAVENOUS | Status: AC
  Filled 2016-09-12: qty 10

## 2016-09-12 MED ORDER — ACETAMINOPHEN 650 MG RE SUPP: 650.0000 mg | Freq: Four times a day (QID) | RECTAL | Status: DC | PRN

## 2016-09-12 MED ORDER — REMIFENTANIL HCL 2 MG IV SOLR
INTRAVENOUS | Status: DC | PRN
  Administered 2016-09-12: .1 ug/kg/min via INTRAVENOUS

## 2016-09-12 MED ORDER — ARTIFICIAL TEARS OP OINT
TOPICAL_OINTMENT | OPHTHALMIC | Status: AC
  Filled 2016-09-12: qty 3.5

## 2016-09-12 MED ORDER — FENTANYL CITRATE (PF) 100 MCG/2ML IJ SOLN
INTRAMUSCULAR | Status: DC | PRN
  Administered 2016-09-12: 50 ug via INTRAVENOUS
  Administered 2016-09-12: 150 ug via INTRAVENOUS

## 2016-09-12 MED ORDER — WARFARIN SODIUM 5 MG PO TABS
10.0000 mg | ORAL_TABLET | Freq: Once | ORAL | Status: AC
  Administered 2016-09-12: 10 mg via ORAL
  Filled 2016-09-12: qty 2

## 2016-09-12 MED ORDER — HYDROMORPHONE HCL 1 MG/ML IJ SOLN
INTRAMUSCULAR | Status: DC | PRN
  Administered 2016-09-12 (×2): 0.5 mg via INTRAVENOUS

## 2016-09-12 MED ORDER — MIDAZOLAM HCL 2 MG/2ML IJ SOLN
INTRAMUSCULAR | Status: DC | PRN
  Administered 2016-09-12 (×2): 1 mg via INTRAVENOUS

## 2016-09-12 MED ORDER — CEFAZOLIN IN D5W 1 GM/50ML IV SOLN
1.0000 g | Freq: Four times a day (QID) | INTRAVENOUS | Status: AC
Start: 2016-09-12 — End: 2016-09-13
  Administered 2016-09-12 – 2016-09-13 (×3): 1 g via INTRAVENOUS
  Filled 2016-09-12 (×4): qty 50

## 2016-09-12 MED ORDER — WARFARIN VIDEO
Freq: Once | Status: AC
  Administered 2016-09-13: 17:00:00

## 2016-09-12 MED ORDER — METHOCARBAMOL 500 MG PO TABS
500.0000 mg | ORAL_TABLET | Freq: Four times a day (QID) | ORAL | Status: DC | PRN
  Administered 2016-09-13 (×3): 1000 mg via ORAL
  Filled 2016-09-12 (×3): qty 2

## 2016-09-12 MED ORDER — SODIUM CHLORIDE 0.9 % IR SOLN
Status: DC | PRN
  Administered 2016-09-12: 1000 mL

## 2016-09-12 MED ORDER — OXYCODONE HCL 5 MG PO TABS
5.0000 mg | ORAL_TABLET | Freq: Four times a day (QID) | ORAL | Status: DC | PRN
  Administered 2016-09-13: 10 mg via ORAL
  Filled 2016-09-12: qty 2

## 2016-09-12 MED ORDER — GLYCOPYRROLATE 0.2 MG/ML IV SOSY
PREFILLED_SYRINGE | INTRAVENOUS | Status: AC
  Filled 2016-09-12: qty 3

## 2016-09-12 MED ORDER — COUMADIN BOOK
Freq: Once | Status: AC
  Administered 2016-09-12: 18:00:00
  Filled 2016-09-12: qty 1

## 2016-09-12 MED ORDER — ROCURONIUM BROMIDE 100 MG/10ML IV SOLN
INTRAVENOUS | Status: DC | PRN
  Administered 2016-09-12: 40 mg via INTRAVENOUS
  Administered 2016-09-12: 10 mg via INTRAVENOUS
  Administered 2016-09-12 (×2): 25 mg via INTRAVENOUS
  Administered 2016-09-12: 50 mg via INTRAVENOUS
  Administered 2016-09-12: 25 mg via INTRAVENOUS
  Administered 2016-09-12: 60 mg via INTRAVENOUS

## 2016-09-12 MED ORDER — MUPIROCIN 2 % EX OINT
1.0000 "application " | TOPICAL_OINTMENT | Freq: Two times a day (BID) | CUTANEOUS | Status: DC
  Administered 2016-09-12 – 2016-09-14 (×3): 1 via NASAL
  Filled 2016-09-12 (×2): qty 22

## 2016-09-12 MED ORDER — LACTATED RINGERS IV SOLN
INTRAVENOUS | Status: DC | PRN
  Administered 2016-09-12 (×4): via INTRAVENOUS

## 2016-09-12 MED ORDER — ACETAMINOPHEN 325 MG PO TABS
650.0000 mg | ORAL_TABLET | Freq: Four times a day (QID) | ORAL | Status: DC | PRN
Start: 2016-09-12 — End: 2016-09-14

## 2016-09-12 MED ORDER — ONDANSETRON HCL 4 MG/2ML IJ SOLN
INTRAMUSCULAR | Status: DC | PRN
  Administered 2016-09-12 (×2): 4 mg via INTRAVENOUS

## 2016-09-12 MED ORDER — LIDOCAINE 2% (20 MG/ML) 5 ML SYRINGE
INTRAMUSCULAR | Status: AC
  Filled 2016-09-12: qty 5

## 2016-09-12 MED ORDER — SODIUM CHLORIDE 0.9 % IV SOLN
0.0125 ug/kg/min | INTRAVENOUS | Status: DC
  Filled 2016-09-12: qty 2000

## 2016-09-12 MED ORDER — GLYCOPYRROLATE 0.2 MG/ML IJ SOLN
INTRAMUSCULAR | Status: DC | PRN
  Administered 2016-09-12 (×2): 0.1 mg via INTRAVENOUS

## 2016-09-12 MED ORDER — SUCCINYLCHOLINE CHLORIDE 200 MG/10ML IV SOSY
PREFILLED_SYRINGE | INTRAVENOUS | Status: AC
  Filled 2016-09-12: qty 10

## 2016-09-12 SURGICAL SUPPLY — 129 items
BANDAGE ACE 4X5 VEL STRL LF (GAUZE/BANDAGES/DRESSINGS) IMPLANT
BANDAGE ACE 6X5 VEL STRL LF (GAUZE/BANDAGES/DRESSINGS) IMPLANT
BANDAGE ELASTIC 4 VELCRO ST LF (GAUZE/BANDAGES/DRESSINGS) ×3 IMPLANT
BANDAGE ESMARK 6X9 LF (GAUZE/BANDAGES/DRESSINGS) ×1 IMPLANT
BIT DRILL 2.5X2.75 QC CALB (BIT) ×3 IMPLANT
BIT DRILL 3.5X5.5 QC CALB (BIT) ×3 IMPLANT
BIT DRILL 3.8 CANN DISP (BIT) ×3 IMPLANT
BIT DRILL 4.5 CANN DISP (BIT) ×3 IMPLANT
BLADE SURG 10 STRL SS (BLADE) ×3 IMPLANT
BLADE SURG 15 STRL LF DISP TIS (BLADE) IMPLANT
BLADE SURG 15 STRL SS (BLADE)
BLADE SURG ROTATE 9660 (MISCELLANEOUS) IMPLANT
BNDG COHESIVE 4X5 TAN STRL (GAUZE/BANDAGES/DRESSINGS) IMPLANT
BNDG COHESIVE 6X5 TAN STRL LF (GAUZE/BANDAGES/DRESSINGS) IMPLANT
BNDG ELASTIC 6X10 VLCR STRL LF (GAUZE/BANDAGES/DRESSINGS) ×3 IMPLANT
BNDG ESMARK 6X9 LF (GAUZE/BANDAGES/DRESSINGS) ×3
BNDG GAUZE ELAST 4 BULKY (GAUZE/BANDAGES/DRESSINGS) ×6 IMPLANT
BONE CANC CHIPS 20CC PCAN1/4 (Bone Implant) ×3 IMPLANT
BRUSH SCRUB DISP (MISCELLANEOUS) ×6 IMPLANT
CHIPS CANC BONE 20CC PCAN1/4 (Bone Implant) ×1 IMPLANT
CLEANER TIP ELECTROSURG 2X2 (MISCELLANEOUS) ×3 IMPLANT
CLOSURE WOUND 1/2 X4 (GAUZE/BANDAGES/DRESSINGS)
COVER MAYO STAND STRL (DRAPES) ×3 IMPLANT
COVER SURGICAL LIGHT HANDLE (MISCELLANEOUS) ×3 IMPLANT
CUFF TOURNIQUET SINGLE 18IN (TOURNIQUET CUFF) IMPLANT
CUFF TOURNIQUET SINGLE 24IN (TOURNIQUET CUFF) IMPLANT
CUFF TOURNIQUET SINGLE 34IN LL (TOURNIQUET CUFF) ×3 IMPLANT
DRAPE C-ARM 42X72 X-RAY (DRAPES) ×3 IMPLANT
DRAPE C-ARMOR (DRAPES) ×3 IMPLANT
DRAPE INCISE IOBAN 66X45 STRL (DRAPES) ×3 IMPLANT
DRAPE ORTHO SPLIT 77X108 STRL (DRAPES)
DRAPE SURG ORHT 6 SPLT 77X108 (DRAPES) IMPLANT
DRAPE U-SHAPE 47X51 STRL (DRAPES) ×3 IMPLANT
DRSG ADAPTIC 3X8 NADH LF (GAUZE/BANDAGES/DRESSINGS) ×6 IMPLANT
DRSG MEPITEL 4X7.2 (GAUZE/BANDAGES/DRESSINGS) ×3 IMPLANT
DRSG PAD ABDOMINAL 8X10 ST (GAUZE/BANDAGES/DRESSINGS) ×6 IMPLANT
ELECT REM PT RETURN 9FT ADLT (ELECTROSURGICAL) ×3
ELECTRODE REM PT RTRN 9FT ADLT (ELECTROSURGICAL) ×1 IMPLANT
EVACUATOR 1/8 PVC DRAIN (DRAIN) IMPLANT
EVACUATOR 3/16  PVC DRAIN (DRAIN)
EVACUATOR 3/16 PVC DRAIN (DRAIN) IMPLANT
GAUZE SPONGE 4X4 12PLY STRL (GAUZE/BANDAGES/DRESSINGS) ×6 IMPLANT
GLOVE BIO SURGEON STRL SZ 6.5 (GLOVE) ×4 IMPLANT
GLOVE BIO SURGEON STRL SZ7 (GLOVE) ×6 IMPLANT
GLOVE BIO SURGEON STRL SZ7.5 (GLOVE) ×3 IMPLANT
GLOVE BIO SURGEON STRL SZ8 (GLOVE) ×3 IMPLANT
GLOVE BIO SURGEONS STRL SZ 6.5 (GLOVE) ×2
GLOVE BIOGEL PI IND STRL 6.5 (GLOVE) ×1 IMPLANT
GLOVE BIOGEL PI IND STRL 7.0 (GLOVE) ×1 IMPLANT
GLOVE BIOGEL PI IND STRL 7.5 (GLOVE) ×4 IMPLANT
GLOVE BIOGEL PI IND STRL 8 (GLOVE) ×1 IMPLANT
GLOVE BIOGEL PI INDICATOR 6.5 (GLOVE) ×2
GLOVE BIOGEL PI INDICATOR 7.0 (GLOVE) ×2
GLOVE BIOGEL PI INDICATOR 7.5 (GLOVE) ×8
GLOVE BIOGEL PI INDICATOR 8 (GLOVE) ×2
GLOVE PROGUARD SZ 7 1/2 (GLOVE) ×3 IMPLANT
GLOVE SS BIOGEL STRL SZ 6.5 (GLOVE) ×1 IMPLANT
GLOVE SUPERSENSE BIOGEL SZ 6.5 (GLOVE) ×2
GLOVE SURG SS PI 6.5 STRL IVOR (GLOVE) ×3 IMPLANT
GOWN STRL REUS W/ TWL LRG LVL3 (GOWN DISPOSABLE) ×3 IMPLANT
GOWN STRL REUS W/ TWL XL LVL3 (GOWN DISPOSABLE) IMPLANT
GOWN STRL REUS W/TWL LRG LVL3 (GOWN DISPOSABLE) ×6
GOWN STRL REUS W/TWL XL LVL3 (GOWN DISPOSABLE)
HANDPIECE INTERPULSE COAX TIP (DISPOSABLE) ×2
IMMOBILIZER KNEE 22 UNIV (SOFTGOODS) ×3 IMPLANT
K-WIRE ACE 1.6X6 (WIRE) ×12
K-WIRE SMOOTH (WIRE) ×6
KIT BASIN OR (CUSTOM PROCEDURE TRAY) ×3 IMPLANT
KIT ROOM TURNOVER OR (KITS) ×3 IMPLANT
KWIRE ACE 1.6X6 (WIRE) ×4 IMPLANT
KWIRE SMOOTH (WIRE) ×2 IMPLANT
MANIFOLD NEPTUNE II (INSTRUMENTS) ×3 IMPLANT
NDL SUT .5 MAYO 1.404X.05X (NEEDLE) IMPLANT
NDL SUT 6 .5 CRC .975X.05 MAYO (NEEDLE) ×1 IMPLANT
NEEDLE 22X1 1/2 (OR ONLY) (NEEDLE) IMPLANT
NEEDLE MAYO TAPER (NEEDLE) ×2
NS IRRIG 1000ML POUR BTL (IV SOLUTION) ×3 IMPLANT
PACK ORTHO EXTREMITY (CUSTOM PROCEDURE TRAY) ×3 IMPLANT
PAD ARMBOARD 7.5X6 YLW CONV (MISCELLANEOUS) ×6 IMPLANT
PAD CAST 4YDX4 CTTN HI CHSV (CAST SUPPLIES) ×1 IMPLANT
PADDING CAST COTTON 4X4 STRL (CAST SUPPLIES) ×2
PADDING CAST COTTON 6X4 STRL (CAST SUPPLIES) ×6 IMPLANT
PIN GUIDE DRILL TIP 2.8X300 (DRILL) ×6 IMPLANT
PLATE 9H 184 LT MED DIST TIB (Plate) ×3 IMPLANT
PLATE ACE 100DEG 3HOLE (Plate) ×3 IMPLANT
SCREW CANN STE 6.5X80X16MM (Screw) ×3 IMPLANT
SCREW CORT FT 32X3.5XNONLOCK (Screw) ×2 IMPLANT
SCREW CORTICAL 3.5MM  20MM (Screw) ×2 IMPLANT
SCREW CORTICAL 3.5MM  32MM (Screw) ×4 IMPLANT
SCREW CORTICAL 3.5MM  34MM (Screw) ×2 IMPLANT
SCREW CORTICAL 3.5MM 20MM (Screw) ×1 IMPLANT
SCREW CORTICAL 3.5MM 34MM (Screw) ×1 IMPLANT
SCREW CORTICAL 3.5MM 38MM (Screw) ×3 IMPLANT
SCREW CORTICAL 3.5MM 40MM (Screw) ×6 IMPLANT
SCREW CORTICAL 3.5MM 48MM (Screw) ×6 IMPLANT
SCREW CORTICAL 3.5MM 50MM (Screw) ×3 IMPLANT
SCREW LOCK 3.5X50 DIST TIB (Screw) ×3 IMPLANT
SCREW LOCK CORT STAR 3.5X12 (Screw) ×3 IMPLANT
SCREW LOCK CORT STAR 3.5X28 (Screw) ×3 IMPLANT
SCREW LOCK CORT STAR 3.5X44 (Screw) ×3 IMPLANT
SCREW LOCK CORT STAR 3.5X48 (Screw) ×3 IMPLANT
SCREW LOCK CORT STAR 3.5X54 (Screw) ×3 IMPLANT
SCREW LOCK CORT STAR 3.5X58 (Screw) ×3 IMPLANT
SET HNDPC FAN SPRY TIP SCT (DISPOSABLE) ×1 IMPLANT
SPONGE LAP 18X18 X RAY DECT (DISPOSABLE) IMPLANT
SPONGE SCRUB IODOPHOR (GAUZE/BANDAGES/DRESSINGS) ×3 IMPLANT
STAPLER VISISTAT 35W (STAPLE) IMPLANT
STOCKINETTE IMPERVIOUS LG (DRAPES) IMPLANT
STRIP CLOSURE SKIN 1/2X4 (GAUZE/BANDAGES/DRESSINGS) IMPLANT
SUCTION FRAZIER HANDLE 10FR (MISCELLANEOUS) ×2
SUCTION TUBE FRAZIER 10FR DISP (MISCELLANEOUS) ×1 IMPLANT
SUT ETHILON 2 0 PSLX (SUTURE) ×3 IMPLANT
SUT ETHILON 3 0 PS 1 (SUTURE) ×6 IMPLANT
SUT PROLENE 0 CT 2 (SUTURE) IMPLANT
SUT VIC AB 0 CT1 27 (SUTURE) ×4
SUT VIC AB 0 CT1 27XBRD ANBCTR (SUTURE) ×2 IMPLANT
SUT VIC AB 1 CT1 27 (SUTURE) ×2
SUT VIC AB 1 CT1 27XBRD ANBCTR (SUTURE) ×1 IMPLANT
SUT VIC AB 2-0 CT1 27 (SUTURE) ×4
SUT VIC AB 2-0 CT1 TAPERPNT 27 (SUTURE) ×2 IMPLANT
SYR 20ML ECCENTRIC (SYRINGE) IMPLANT
SYR CONTROL 10ML LL (SYRINGE) IMPLANT
TOWEL OR 17X24 6PK STRL BLUE (TOWEL DISPOSABLE) ×6 IMPLANT
TOWEL OR 17X26 10 PK STRL BLUE (TOWEL DISPOSABLE) ×6 IMPLANT
TRAY FOLEY CATH 16FRSI W/METER (SET/KITS/TRAYS/PACK) ×3 IMPLANT
TUBE CONNECTING 12'X1/4 (SUCTIONS) ×1
TUBE CONNECTING 12X1/4 (SUCTIONS) ×2 IMPLANT
UNDERPAD 30X30 (UNDERPADS AND DIAPERS) ×3 IMPLANT
YANKAUER SUCT BULB TIP NO VENT (SUCTIONS) ×3 IMPLANT

## 2016-09-12 NOTE — Transfer of Care (Signed)
Immediate Anesthesia Transfer of Care Note  Patient: Donney DiceRobert Mallette  Procedure(s) Performed: Procedure(s): OPEN REDUCTION INTERNAL FIXATION (ORIF) distal TIBIA FRACTURE (Left) EXTERNAL FIXATION LEG (Left)  Patient Location: PACU  Anesthesia Type:General  Level of Consciousness: awake, alert  and patient cooperative  Airway & Oxygen Therapy: Patient Spontanous Breathing and Patient connected to nasal cannula oxygen  Post-op Assessment: Report given to RN, Post -op Vital signs reviewed and stable, Patient moving all extremities X 4 and Patient able to stick tongue midline  Post vital signs: Reviewed and stable  Last Vitals:  Vitals:   09/11/16 2158 09/12/16 0458  BP: 130/83 119/82  Pulse: 89 85  Resp: 18 16  Temp: 36.7 C 36.9 C    Last Pain:  Vitals:   09/12/16 0458  TempSrc: Oral  PainSc:       Patients Stated Pain Goal: 1 (09/10/16 2326)  Complications: No apparent anesthesia complications
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
## 2016-09-12 NOTE — Anesthesia Preprocedure Evaluation (Signed)
Anesthesia Evaluation  Patient identified by MRN, date of birth, ID band Patient awake    Reviewed: Allergy & Precautions, NPO status , Patient's Chart, lab work & pertinent test results  Airway Mallampati: II  TM Distance: >3 FB Neck ROM: Full    Dental no notable dental hx.    Pulmonary neg pulmonary ROS, Current Smoker,    Pulmonary exam normal breath sounds clear to auscultation       Cardiovascular negative cardio ROS Normal cardiovascular exam Rhythm:Regular Rate:Normal     Neuro/Psych negative neurological ROS  negative psych ROS   GI/Hepatic negative GI ROS, Neg liver ROS,   Endo/Other  diabetesMorbid obesity  Renal/GU negative Renal ROS  negative genitourinary   Musculoskeletal negative musculoskeletal ROS (+)   Abdominal   Peds negative pediatric ROS (+)  Hematology negative hematology ROS (+)   Anesthesia Other Findings   Reproductive/Obstetrics negative OB ROS                             Anesthesia Physical Anesthesia Plan  ASA: III  Anesthesia Plan: General   Post-op Pain Management:    Induction: Intravenous  Airway Management Planned: Oral ETT and Video Laryngoscope Planned  Additional Equipment:   Intra-op Plan:   Post-operative Plan: Extubation in OR  Informed Consent: I have reviewed the patients History and Physical, chart, labs and discussed the procedure including the risks, benefits and alternatives for the proposed anesthesia with the patient or authorized representative who has indicated his/her understanding and acceptance.   Dental advisory given  Plan Discussed with: CRNA and Surgeon  Anesthesia Plan Comments:         Anesthesia Quick Evaluation
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
## 2016-09-12 NOTE — Anesthesia Procedure Notes (Signed)
Procedure Name: Intubation Date/Time: 09/12/2016 8:46 AM Performed by: Eilene GhaziOSE, GEORGE Pre-anesthesia Checklist: Patient identified, Emergency Drugs available, Suction available and Patient being monitored Patient Re-evaluated:Patient Re-evaluated prior to inductionOxygen Delivery Method: Circle system utilized Preoxygenation: Pre-oxygenation with 100% oxygen Intubation Type: IV induction Ventilation: Mask ventilation without difficulty Laryngoscope Size: Glidescope (T4) Grade View: Grade I Tube type: Oral Tube size: 7.5 mm Number of attempts: 1 Airway Equipment and Method: Video-laryngoscopy Placement Confirmation: ETT inserted through vocal cords under direct vision,  positive ETCO2 and breath sounds checked- equal and bilateral Secured at: 23 cm Tube secured with: Tape Dental Injury: Teeth and Oropharynx as per pre-operative assessment  Difficulty Due To: Difficulty was anticipated

## 2016-09-13 LAB — GLUCOSE, CAPILLARY
GLUCOSE-CAPILLARY: 162 mg/dL — AB (ref 65–99)
GLUCOSE-CAPILLARY: 117 mg/dL — AB (ref 65–99)
GLUCOSE-CAPILLARY: 132 mg/dL — AB (ref 65–99)
Glucose-Capillary: 149 mg/dL — ABNORMAL HIGH (ref 65–99)

## 2016-09-13 LAB — PROTIME-INR
INR: 1.18
PROTHROMBIN TIME: 15.1 s (ref 11.4–15.2)

## 2016-09-13 LAB — BASIC METABOLIC PANEL
Anion gap: 6 (ref 5–15)
BUN: 9 mg/dL (ref 6–20)
CHLORIDE: 106 mmol/L (ref 101–111)
CO2: 27 mmol/L (ref 22–32)
CREATININE: 0.8 mg/dL (ref 0.61–1.24)
Calcium: 8.9 mg/dL (ref 8.9–10.3)
GFR calc non Af Amer: >60 mL/min (ref >60)
Glucose, Bld: 119 mg/dL — ABNORMAL HIGH (ref 65–99)
POTASSIUM: 4.1 mmol/L (ref 3.5–5.1)
SODIUM: 139 mmol/L (ref 135–145)

## 2016-09-13 LAB — CBC
HEMATOCRIT: 37.5 % — AB (ref 39.0–52.0)
HEMOGLOBIN: 13.4 g/dL (ref 13.0–17.0)
MCH: 31.4 pg (ref 26.0–34.0)
MCHC: 35.7 g/dL (ref 30.0–36.0)
MCV: 87.8 fL (ref 78.0–100.0)
Platelets: 335 10*3/uL (ref 150–400)
RBC: 4.27 MIL/uL (ref 4.22–5.81)
RDW: 13.5 % (ref 11.5–15.5)
WBC: 13.3 10*3/uL — ABNORMAL HIGH (ref 4.0–10.5)

## 2016-09-13 MED ORDER — WARFARIN SODIUM 5 MG PO TABS
10.0000 mg | ORAL_TABLET | Freq: Once | ORAL | Status: AC
Start: 2016-09-13 — End: 2016-09-13
  Administered 2016-09-13: 10 mg via ORAL
  Filled 2016-09-13: qty 2
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
## 2016-09-13 NOTE — Care Management Note (Signed)
Case Management Note  Patient Details  Name: [name] DiceRobert Massaro MRN: [id number] Date of Birth: [birth year]  Subjective/Objective:                    Action/Plan: Spoke to patient and wife at bedside. Confirmed face sheet information.   Patient already has wheel chair , bedside commode with drop down arm, and walker at home. Wife requesting sliding board, same ordered through [facility].   Offered ambulance transportation home at discharge, however, both state patient does not need ambulance transport he can get in and out of car.  Expected Discharge Date:                  Expected Discharge Plan:  Home w Home Health Services  In-House Referral:     Discharge planning Services  CM Consult  Post Acute Care Choice:  Home Health, Durable Medical Equipment Choice offered to:  Patient, Spouse  DME Arranged:    DME Agency:  [facility]  [facility]  [facility]  Status of Service:  Completed, signed off  If discussed at Long Length of Stay Meetings, dates discussed:    Additional Comments:  Kingsley PlanWile, Shary Lamos Marie, RN 09/13/2016, 11:27 AM

## 2016-09-13 NOTE — Progress Notes (Signed)
Social Work  Discharge Note  The overall goal for the admission was met for:   Discharge location: No - pt transferred back to acute for further surgery  Length of Stay: Yes - 7 days  Discharge activity level: Yes - supervision overall  Home/community participation: Yes  Services provided included: MD, RD, PT, OT, RN, TR, Pharmacy and [place]: Private Insurance: [employer]  Follow-up services arranged: Home Health: PT, OT via [place], DME: 22x18 w/c with ELRs, cushion, bari rolling walker, wide drop arm commmode via [facility] and Other: NA  Comments (or additional information):  Patient/Family verbalized understanding of follow-up arrangements: Yes  Individual responsible for coordination of the follow-up plan: pt  Confirmed correct DME delivered: Milyn Stapleton 09/13/2016    Zakry Caso
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
## 2016-09-13 NOTE — Discharge Instructions (Addendum)
[facility] Discharge Instructions,   General Discharge Instructions  WEIGHT BEARING STATUS: Nonweightbearing bilaterally. Can Toe touch weight bear on R leg with black cam boot on to help with pivot transfers  RANGE OF MOTION/ACTIVITY: range of motion as tolerated Right ankle. No range of motion of left ankle yet. Knee motion as tolerated  Wound Care: keep splint on left ankle clean and dry. We will change at first follow up visit. Ok to clean wounds on right ankle with soap and water   PAIN MEDICATION USE AND EXPECTATIONS  You have likely been given narcotic medications to help control your pain.  After a traumatic event that results in an fracture (broken bone) with or without surgery, it is ok to use narcotic pain medications to help control one's pain.  We understand that everyone responds to pain differently and each individual patient will be evaluated on a regular basis for the continued need for narcotic medications. Ideally, narcotic medication use should last no more than 6-8 weeks (coinciding with fracture healing).   As a patient it is your responsibility as well to monitor narcotic medication use and report the amount and frequency you use these medications when you come to your office visit.   We would also advise that if you are using narcotic medications, you should take a dose prior to therapy to maximize you participation.  IF YOU ARE ON NARCOTIC MEDICATIONS IT IS NOT PERMISSIBLE TO OPERATE A MOTOR VEHICLE (MOTORCYCLE/CAR/TRUCK/MOPED) OR HEAVY MACHINERY DO NOT MIX NARCOTICS WITH OTHER CNS (CENTRAL NERVOUS SYSTEM) DEPRESSANTS SUCH AS ALCOHOL  Diet: as you were eating previously.  Can use over the counter stool softeners and bowel preparations, such as Miralax, to help with bowel movements.  Narcotics can be constipating.  Be sure to drink plenty of fluids    STOP SMOKING OR USING NICOTINE PRODUCTS!!!!  As discussed nicotine severely impairs your body's ability  to heal surgical and traumatic wounds but also impairs bone healing.  Wounds and bone heal by forming microscopic blood vessels (angiogenesis) and nicotine is a vasoconstrictor (essentially, shrinks blood vessels).  Therefore, if vasoconstriction occurs to these microscopic blood vessels they essentially disappear and are unable to deliver necessary nutrients to the healing tissue.  This is one modifiable factor that you can do to dramatically increase your chances of healing your injury.    (This means no smoking, no nicotine gum, patches, etc)  DO NOT USE NONSTEROIDAL ANTI-INFLAMMATORY DRUGS (NSAID'S)  Using products such as Advil (ibuprofen), Aleve (naproxen), Motrin (ibuprofen) for additional pain control during fracture healing can delay and/or prevent the healing response.  If you would like to take over the counter (OTC) medication, Tylenol (acetaminophen) is ok.  However, some narcotic medications that are given for pain control contain acetaminophen as well. Therefore, you should not exceed more than 4000 mg of tylenol in a day if you do not have liver disease.  Also note that there are may OTC medicines, such as cold medicines and allergy medicines that my contain tylenol as well.  If you have any questions about medications and/or interactions please ask your doctor/PA or your pharmacist.      ICE AND ELEVATE INJURED/OPERATIVE EXTREMITY  Using ice and elevating the injured extremity above your heart can help with swelling and pain control.  Icing in a pulsatile fashion, such as 20 minutes on and 20 minutes off, can be followed.    Do not place ice directly on skin. Make sure there is a barrier  between to skin and the ice pack.    Using frozen items such as frozen peas works well as the conform nicely to the are that needs to be iced.  USE AN ACE WRAP OR TED HOSE FOR SWELLING CONTROL  In addition to icing and elevation, Ace wraps or TED hose are used to help limit and resolve swelling.  It is  recommended to use Ace wraps or TED hose until you are informed to stop.    When using Ace Wraps start the wrapping distally (farthest away from the body) and wrap proximally (closer to the body)   Example: If you had surgery on your leg or thing and you do not have a splint on, start the ace wrap at the toes and work your way up to the thigh        If you had surgery on your upper extremity and do not have a splint on, start the ace wrap at your fingers and work your way up to the upper arm  IF YOU ARE IN A SPLINT OR CAST DO NOT REMOVE IT FOR ANY REASON   If your splint gets wet for any reason please contact the office immediately. You may shower in your splint or cast as long as you keep it dry.  This can be done by wrapping in a cast cover or garbage back (or similar)  Do Not stick any thing down your splint or cast such as pencils, money, or hangers to try and scratch yourself with.  If you feel itchy take benadryl as prescribed on the bottle for itching  IF YOU ARE IN A CAM BOOT (BLACK BOOT)  You may remove boot periodically. Perform daily dressing changes as noted below.  Wash the liner of the boot regularly and wear a sock when wearing the boot. It is recommended that you sleep in the boot until told otherwise  CALL THE OFFICE WITH ANY QUESTIONS OR CONCERNS: [phone]     Discharge Wound Care Instructions  Do NOT apply any ointments, solutions or lotions to pin sites or surgical wounds.  These prevent needed drainage and even though solutions like hydrogen peroxide kill bacteria, they also damage cells lining the pin sites that help fight infection.  Applying lotions or ointments can keep the wounds moist and can cause them to breakdown and open up as well. This can increase the risk for infection. When in doubt call the office.  Surgical incisions should be dressed daily.  If any drainage is noted, use one layer of adaptic, then gauze, Kerlix, and an ace wrap.  Once the incision is  completely dry and without drainage, it may be left open to air out.  Showering may begin 36-48 hours later.  Cleaning gently with soap and water.  Traumatic wounds should be dressed daily as well.    One layer of adaptic, gauze, Kerlix, then ace wrap.  The adaptic can be discontinued once the draining has ceased    If you have a wet to dry dressing: wet the gauze with saline the squeeze as much saline out so the gauze is moist (not soaking wet), place moistened gauze over wound, then place a dry gauze over the moist one, followed by Kerlix wrap, then ace wrap.   Information on my medicine - Coumadin   (Warfarin)  This medication education was reviewed with me or my healthcare representative as part of my discharge preparation.  The pharmacist that spoke with me during my hospital stay  was:  Herby AbrahamBell, Michelle T, [facility] LPRPH  Why was Coumadin prescribed for you? Coumadin was prescribed for you because you have a blood clot or a medical condition that can cause an increased risk of forming blood clots. Blood clots can cause serious health problems by blocking the flow of blood to the heart, lung, or brain. Coumadin can prevent harmful blood clots from forming. As a reminder your indication for Coumadin is:   Blood Clot Prevention After Orthopedic Surgery  What test will check on my response to Coumadin? While on Coumadin (warfarin) you will need to have an INR test regularly to ensure that your dose is keeping you in the desired range. The INR (international normalized ratio) number is calculated from the result of the laboratory test called prothrombin time (PT).  If an INR APPOINTMENT HAS NOT ALREADY BEEN MADE FOR YOU please schedule an appointment to have this lab work done by your health care provider within 7 days. Your INR goal is usually a number between:  2 to 3 or your provider may give you a more narrow range like 2-2.5.  Ask your health care provider during an office visit what your goal INR  is.  What  do you need to  know  About  COUMADIN? Take Coumadin (warfarin) exactly as prescribed by your healthcare provider about the same time each day.  DO NOT stop taking without talking to the doctor who prescribed the medication.  Stopping without other blood clot prevention medication to take the place of Coumadin may increase your risk of developing a new clot or stroke.  Get refills before you run out.  What do you do if you miss a dose? If you miss a dose, take it as soon as you remember on the same day then continue your regularly scheduled regimen the next day.  Do not take two doses of Coumadin at the same time.  Important Safety Information A possible side effect of Coumadin (Warfarin) is an increased risk of bleeding. You should call your healthcare provider right away if you experience any of the following: ? Bleeding from an injury or your nose that does not stop. ? Unusual colored urine (red or dark brown) or unusual colored stools (red or black). ? Unusual bruising for unknown reasons. ? A serious fall or if you hit your head (even if there is no bleeding).  Some foods or medicines interact with Coumadin (warfarin) and might alter your response to warfarin. To help avoid this: ? Eat a balanced diet, maintaining a consistent amount of Vitamin K. ? Notify your provider about major diet changes you plan to make. ? Avoid alcohol or limit your intake to 1 drink for women and 2 drinks for men per day. (1 drink is 5 oz. wine, 12 oz. beer, or 1.5 oz. liquor.)  Make sure that ANY health care provider who prescribes medication for you knows that you are taking Coumadin (warfarin).  Also make sure the healthcare provider who is monitoring your Coumadin knows when you have started a new medication including herbals and non-prescription products.  Coumadin (Warfarin)  Major Drug Interactions  Increased Warfarin Effect Decreased Warfarin Effect  Alcohol (large quantities) Antibiotics  (esp. Septra/Bactrim, Flagyl, Cipro) Amiodarone (Cordarone) Aspirin (ASA) Cimetidine (Tagamet) Megestrol (Megace) NSAIDs (ibuprofen, naproxen, etc.) Piroxicam (Feldene) Propafenone (Rythmol SR) Propranolol (Inderal) Isoniazid (INH) Posaconazole (Noxafil) Barbiturates (Phenobarbital) Carbamazepine (Tegretol) Chlordiazepoxide (Librium) Cholestyramine (Questran) Griseofulvin Oral Contraceptives Rifampin Sucralfate (Carafate) Vitamin K   Coumadin (Warfarin) Major Herbal Interactions  Increased Warfarin Effect Decreased Warfarin Effect  Garlic Ginseng Ginkgo biloba Coenzyme Q10 Green tea St. Johns wort    Coumadin (Warfarin) FOOD Interactions  Eat a consistent number of servings per week of foods HIGH in Vitamin K (1 serving =  cup)  Collards (cooked, or boiled & drained) Kale (cooked, or boiled & drained) Mustard greens (cooked, or boiled & drained) Parsley *serving size only =  cup Spinach (cooked, or boiled & drained) Swiss chard (cooked, or boiled & drained) Turnip greens (cooked, or boiled & drained)  Eat a consistent number of servings per week of foods MEDIUM-HIGH in Vitamin K (1 serving = 1 cup)  Asparagus (cooked, or boiled & drained) Broccoli (cooked, boiled & drained, or raw & chopped) Brussel sprouts (cooked, or boiled & drained) *serving size only =  cup Lettuce, raw (green leaf, endive, romaine) Spinach, raw Turnip greens, raw & chopped   These websites have more information on Coumadin (warfarin):  http://www.king-russell.com/; https://www.hines.net/;
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
## 2016-09-13 NOTE — Anesthesia Postprocedure Evaluation (Signed)
Anesthesia Post Note  Patient: [name] DiceRobert [name]  Procedure(s) Performed: Procedure(s) (LRB): OPEN REDUCTION INTERNAL FIXATION (ORIF) distal TIBIA FRACTURE (Left) EXTERNAL FIXATION LEG (Left)  Patient location during evaluation: PACU Anesthesia Type: General Level of consciousness: awake and alert Pain management: pain level controlled Vital Signs Assessment: post-procedure vital signs reviewed and stable Respiratory status: spontaneous breathing, nonlabored ventilation, respiratory function stable and patient connected to nasal cannula oxygen Cardiovascular status: blood pressure returned to baseline and stable Postop Assessment: no signs of nausea or vomiting Anesthetic complications: no    Last Vitals:  Vitals:   09/13/16 0151 09/13/16 0535  BP: 136/73 126/84  Pulse: 98 (!) 101  Resp: 17 17  Temp: 37.2 C 37.1 C    Last Pain:  Vitals:   09/13/16 0851  TempSrc:   PainSc: Asleep                 [name] [name] S
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
## 2016-09-14 ENCOUNTER — Encounter (HOSPITAL_COMMUNITY): Payer: Self-pay | Admitting: Orthopedic Surgery

## 2016-09-14 LAB — BASIC METABOLIC PANEL
Anion gap: 11 (ref 5–15)
BUN: 7 mg/dL (ref 6–20)
CALCIUM: 8.8 mg/dL — AB (ref 8.9–10.3)
CHLORIDE: 103 mmol/L (ref 101–111)
CO2: 26 mmol/L (ref 22–32)
CREATININE: 0.74 mg/dL (ref 0.61–1.24)
GFR calc Af Amer: >60 mL/min (ref >60)
GFR calc non Af Amer: >60 mL/min (ref >60)
GLUCOSE: 116 mg/dL — AB (ref 65–99)
Potassium: 3.7 mmol/L (ref 3.5–5.1)
Sodium: 140 mmol/L (ref 135–145)

## 2016-09-14 LAB — CBC
HEMATOCRIT: 35.6 % — AB (ref 39.0–52.0)
HEMOGLOBIN: 12.7 g/dL — AB (ref 13.0–17.0)
MCH: 31.1 pg (ref 26.0–34.0)
MCHC: 35.7 g/dL (ref 30.0–36.0)
MCV: 87 fL (ref 78.0–100.0)
Platelets: 321 10*3/uL (ref 150–400)
RBC: 4.09 MIL/uL — ABNORMAL LOW (ref 4.22–5.81)
RDW: 13.6 % (ref 11.5–15.5)
WBC: 11.7 10*3/uL — AB (ref 4.0–10.5)

## 2016-09-14 LAB — PROTIME-INR
INR: 1.7
Prothrombin Time: 20.2 seconds — ABNORMAL HIGH (ref 11.4–15.2)

## 2016-09-14 LAB — GLUCOSE, CAPILLARY
GLUCOSE-CAPILLARY: 125 mg/dL — AB (ref 65–99)
GLUCOSE-CAPILLARY: 181 mg/dL — AB (ref 65–99)

## 2016-09-14 MED ORDER — WARFARIN SODIUM 5 MG PO TABS: 5.0000 mg | ORAL_TABLET | Freq: Every day | ORAL | 1 refills | Status: DC

## 2016-09-14 MED ORDER — WARFARIN SODIUM 5 MG PO TABS
5.0000 mg | ORAL_TABLET | Freq: Once | ORAL | Status: AC
  Administered 2016-09-14: 5 mg via ORAL
  Filled 2016-09-14: qty 1

## 2016-09-14 MED ORDER — ENOXAPARIN SODIUM 30 MG/0.3ML ~~LOC~~ SOLN: 30.0000 mg | Freq: Two times a day (BID) | SUBCUTANEOUS | 0 refills | Status: DC

## 2016-09-14 MED ORDER — METHOCARBAMOL 500 MG PO TABS
500.0000 mg | ORAL_TABLET | Freq: Four times a day (QID) | ORAL | 0 refills | Status: DC | PRN
Start: 2016-09-14 — End: 2019-09-04

## 2016-09-14 MED ORDER — ENOXAPARIN (LOVENOX) PATIENT EDUCATION KIT
PACK | Freq: Once | Status: DC
  Filled 2016-09-14: qty 1

## 2016-09-14 NOTE — Care Management Note (Signed)
Case Management Note  Patient Details  Name: [name] [name] MRN: [id number] Date of Birth: 02/11/1961  Subjective/Objective:                    Action/Plan:  Sliding board has been delivered . [facility] 09-15-16 and call results [facility]. [name] Expected Discharge Date:                  Expected Discharge Plan:  Home w Home Health Services  In-House Referral:     Discharge planning Services  CM Consult  Post Acute Care Choice:  Home Health, Durable Medical Equipment Choice offered to:  Patient, Spouse  DME Arranged:    DME Agency:  [facility]  HH Arranged:  [facility]  [facility]  Status of Service:  Completed, signed off  If discussed at Long Length of Stay Meetings, dates discussed:    Additional Comments:  [name] [name], [name] Farrelly Marie, RN 09/14/2016, 11:09 AM
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
## 2016-09-14 NOTE — Progress Notes (Signed)
Left short leg splint dry and intact, pain is controlled w/ oral narcotics. NWB to BLE maint, except TTWB to RLE when transferring. Discharge instructions given to pt and wife, verbalized understanding. Discharged home accompanied by wife.

## 2016-09-21 NOTE — Anesthesia Postprocedure Evaluation (Signed)
Anesthesia Post Note  Patient: Donney DiceRobert Murad  Procedure(s) Performed: Procedure(s) (LRB): EXTERNAL FIXATION LEG adjustment (Left)  Patient location during evaluation: PACU Anesthesia Type: General Level of consciousness: awake and alert Pain management: pain level controlled Vital Signs Assessment: post-procedure vital signs reviewed and stable Respiratory status: spontaneous breathing, nonlabored ventilation, respiratory function stable and patient connected to nasal cannula oxygen Cardiovascular status: blood pressure returned to baseline and stable Postop Assessment: no signs of nausea or vomiting Anesthetic complications: no    Last Vitals:  Vitals:   09/13/16 2121 09/14/16 0529  BP: (!) 149/89 (!) 145/87  Pulse: 96 99  Resp: 17 17  Temp: 37.4 C 36.8 C    Last Pain:  Vitals:   09/14/16 0622  TempSrc:   PainSc: Asleep                 Edona Schreffler,JAMES TERRILL
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
## 2016-10-18 ENCOUNTER — Encounter
Payer: BLUE CROSS/BLUE SHIELD | Attending: Physical Medicine & Rehabilitation | Admitting: Physical Medicine & Rehabilitation

## 2016-10-18 ENCOUNTER — Encounter (INDEPENDENT_AMBULATORY_CARE_PROVIDER_SITE_OTHER): Payer: Self-pay

## 2016-10-18 ENCOUNTER — Encounter: Payer: Self-pay | Admitting: Physical Medicine & Rehabilitation

## 2016-10-18 VITALS — BP 122/84 | HR 91 | Resp 14

## 2016-10-18 DIAGNOSIS — M25572 Pain in left ankle and joints of left foot: Secondary | ICD-10-CM | POA: Diagnosis not present

## 2016-10-18 DIAGNOSIS — S32009S Unspecified fracture of unspecified lumbar vertebra, sequela: Secondary | ICD-10-CM

## 2016-10-18 DIAGNOSIS — E119 Type 2 diabetes mellitus without complications: Secondary | ICD-10-CM | POA: Diagnosis not present

## 2016-10-18 DIAGNOSIS — Z7984 Long term (current) use of oral hypoglycemic drugs: Secondary | ICD-10-CM | POA: Insufficient documentation

## 2016-10-18 NOTE — Patient Instructions (Signed)
PLEASE CALL ME WITH ANY PROBLEMS OR QUESTIONS ([phone])  
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
## 2016-12-18 ENCOUNTER — Encounter
Payer: BLUE CROSS/BLUE SHIELD | Attending: Physical Medicine & Rehabilitation | Admitting: Physical Medicine & Rehabilitation

## 2016-12-18 DIAGNOSIS — M25572 Pain in left ankle and joints of left foot: Secondary | ICD-10-CM | POA: Insufficient documentation

## 2016-12-18 DIAGNOSIS — E119 Type 2 diabetes mellitus without complications: Secondary | ICD-10-CM | POA: Insufficient documentation

## 2016-12-18 DIAGNOSIS — Z7984 Long term (current) use of oral hypoglycemic drugs: Secondary | ICD-10-CM | POA: Insufficient documentation

## 2017-03-01 ENCOUNTER — Other Ambulatory Visit: Payer: Self-pay | Admitting: Orthopedic Surgery

## 2017-03-01 DIAGNOSIS — S82872K Displaced pilon fracture of left tibia, subsequent encounter for closed fracture with nonunion: Secondary | ICD-10-CM

## 2017-03-07 ENCOUNTER — Ambulatory Visit
Admission: RE | Admit: 2017-03-07 | Discharge: 2017-03-07 | Disposition: A | Discharge status: Home / Self Care | Payer: BLUE CROSS/BLUE SHIELD | Source: Ambulatory Visit | Attending: Orthopedic Surgery | Admitting: Orthopedic Surgery

## 2017-03-07 DIAGNOSIS — S82872K Displaced pilon fracture of left tibia, subsequent encounter for closed fracture with nonunion: Secondary | ICD-10-CM

## 2017-05-31 IMAGING — RF DG C-ARM 61-120 MIN
1 series · 4 of 4 positions shown · non-contrast
Comparison: CT 09/07/2016.

CLINICAL DATA: Left ankle injury.  Postreduction.

EXAM:
DG C-ARM 61-120 MIN

[Series 1: run · 4 of 4 slices shown]
[im 1/4]
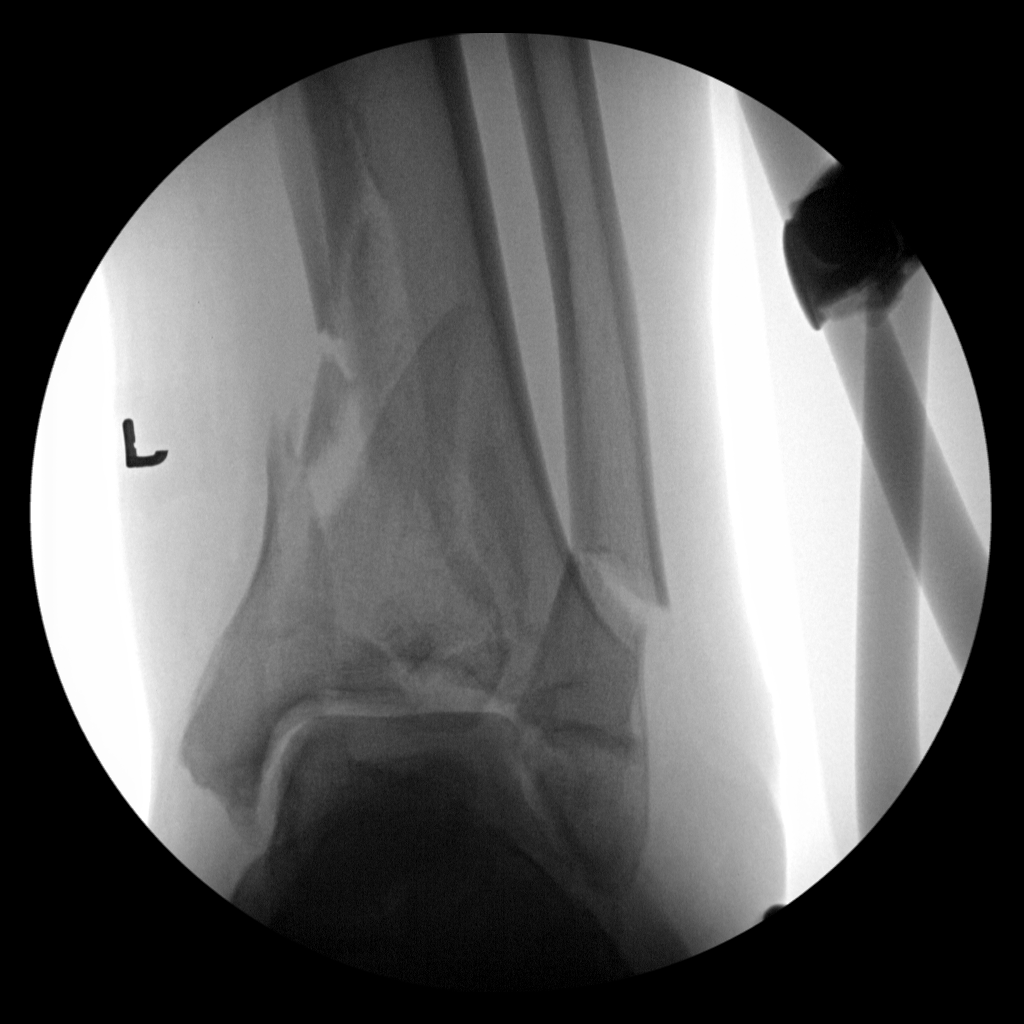
[im 2/4]
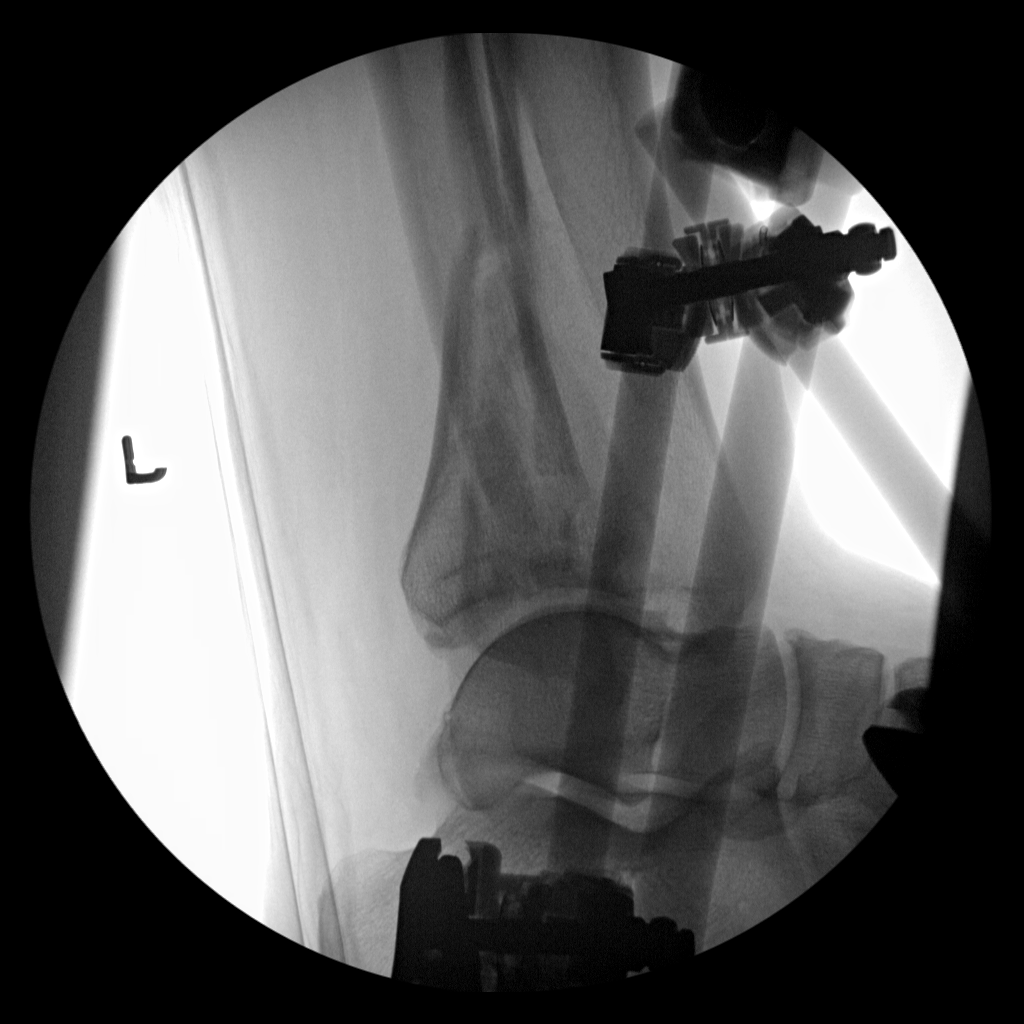
[im 3/4]
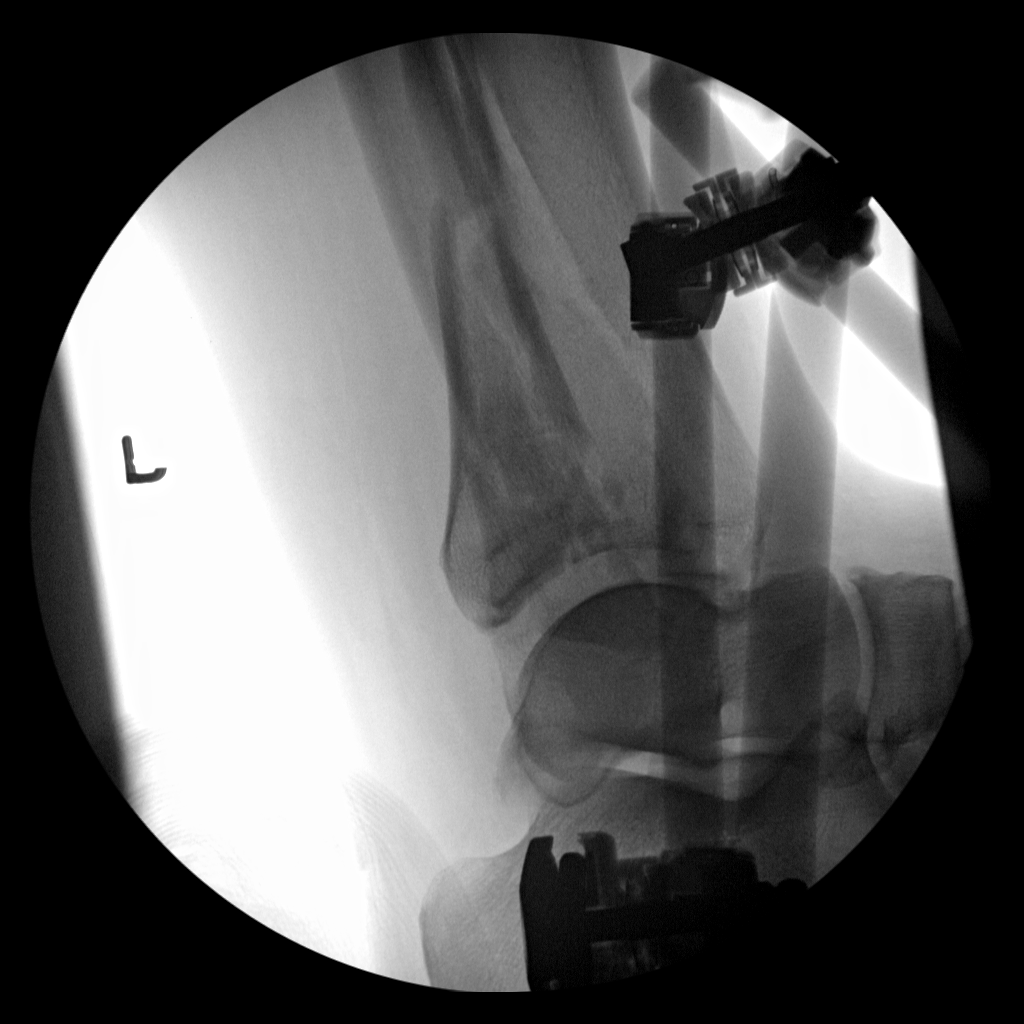
[im 4/4]
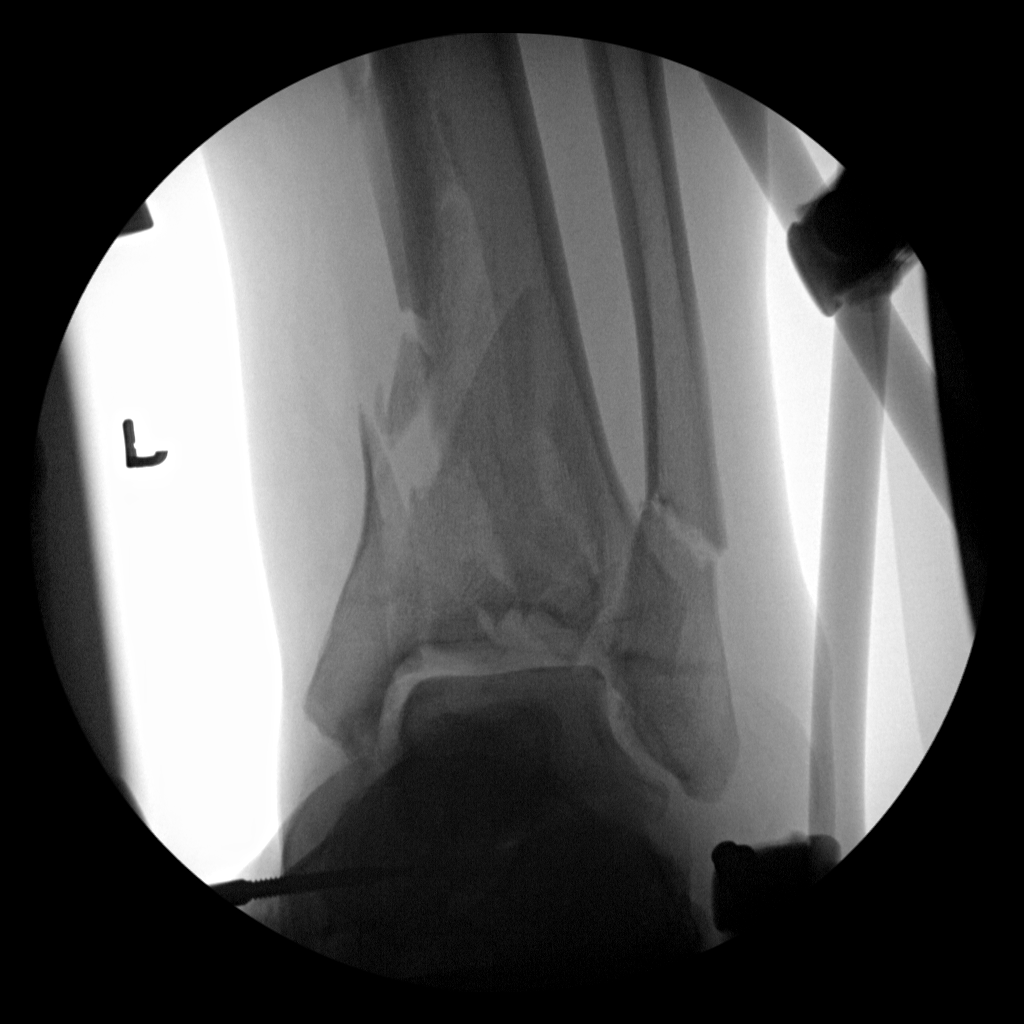

[4 of 4 positions shown; findings below may reference images not displayed]

FINDINGS: Comminuted displaced fracture with fragments fragment present.
Fractures extend into the tibial Blain joint space. Distal fibular
fracture is present.
IMPRESSION: Comminuted displaced fracture with multiple fracture fragments
noted. Fractures extend into the tibiotalar joint space. Distal
fibular fracture is present.

## 2017-06-04 IMAGING — CR DG ANKLE PORT 2V*L*
3 series · 3 of 3 positions shown · non-contrast
Comparison: 09/08/2016

CLINICAL DATA: Post fixation of closed right ankle fracture.

EXAM:
PORTABLE LEFT ANKLE - 2 VIEW

[AP (1 of 2)]
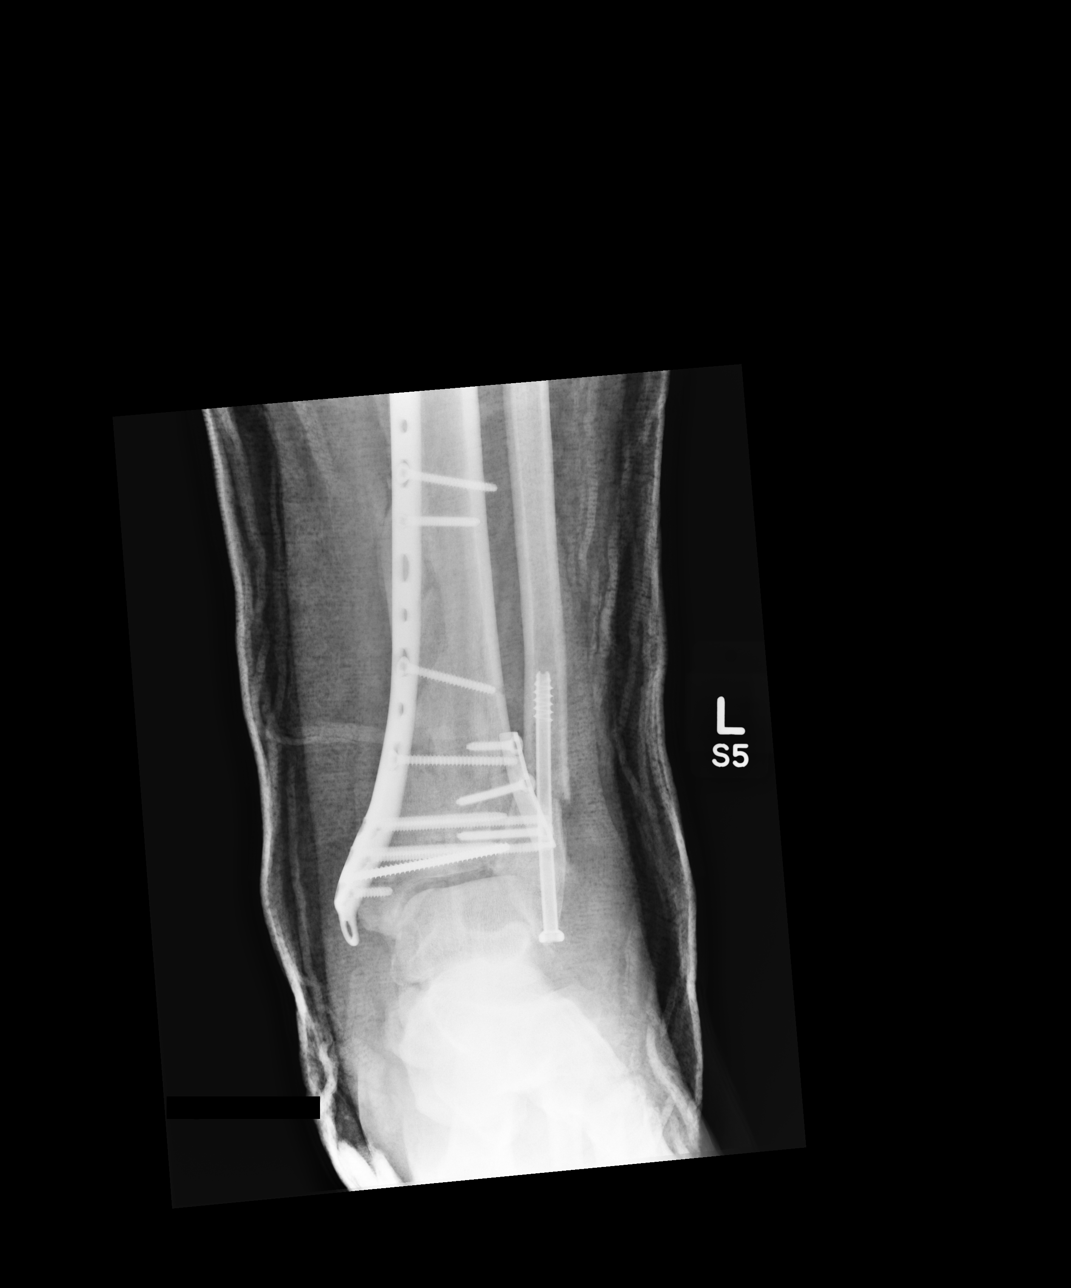

[lateral]
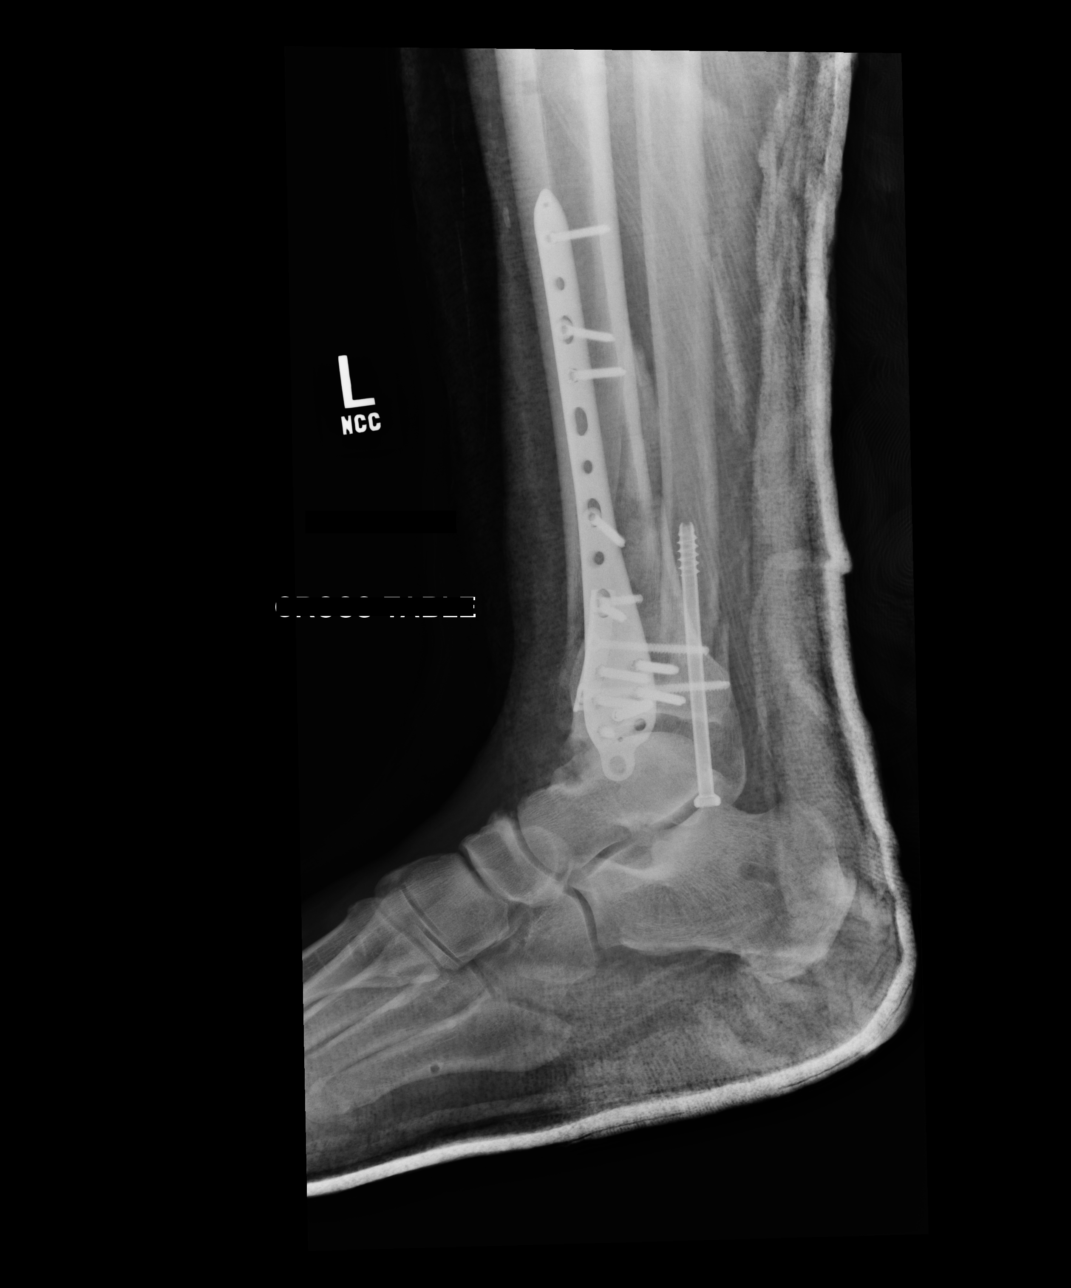

[AP (2 of 2)]
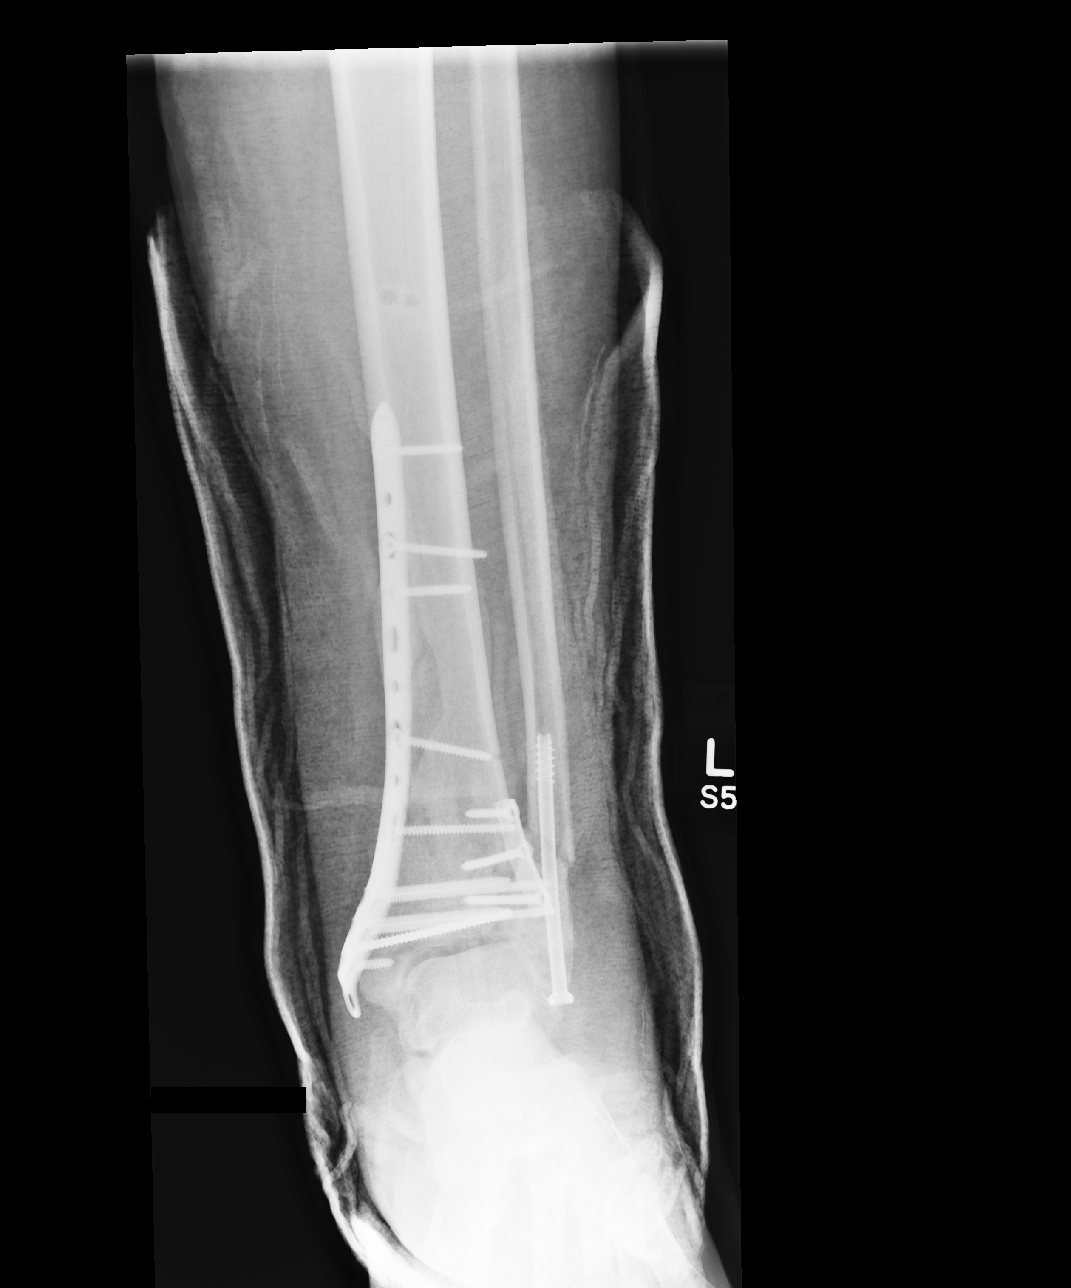

[3 of 3 positions shown; findings below may reference images not displayed]

FINDINGS: There has been interval lateral plate and screw fixation of
comminuted tibial plafond fracture. Intra medullary nail fixation of
left fibular metaphyseal fracture is also seen. Alignment is near
anatomic. Cast material overlies the soft tissues than obscures bony
details.
IMPRESSION: Status post fixation of comminuted tibial plafond fracture and
associated distal fibular fracture, in near anatomic alignment.

## 2017-06-04 IMAGING — RF DG ANKLE COMPLETE 3+V*L*
1 series · 7 of 7 positions shown · non-contrast
Comparison: 09/08/2016

CLINICAL DATA: ORIF

EXAM:
DG C-ARM GT 120 MIN; LEFT ANKLE COMPLETE - 3+ VIEW

[Series 1: run · 7 of 7 slices shown]
[im 1/7]
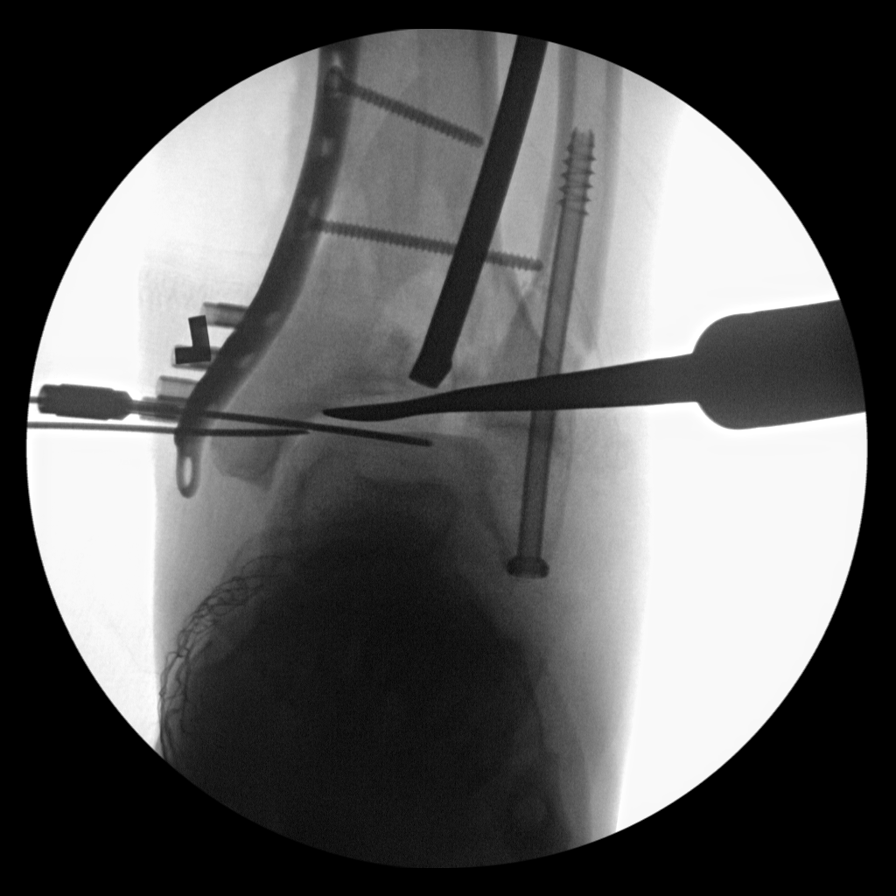
[im 2/7]
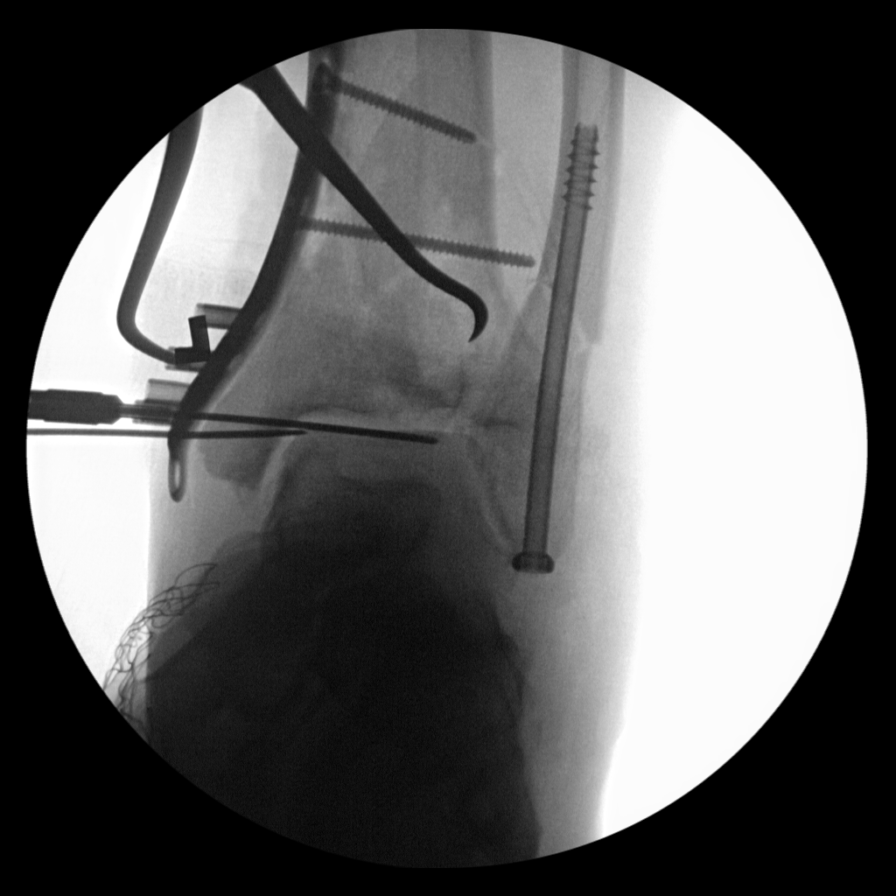
[im 3/7]
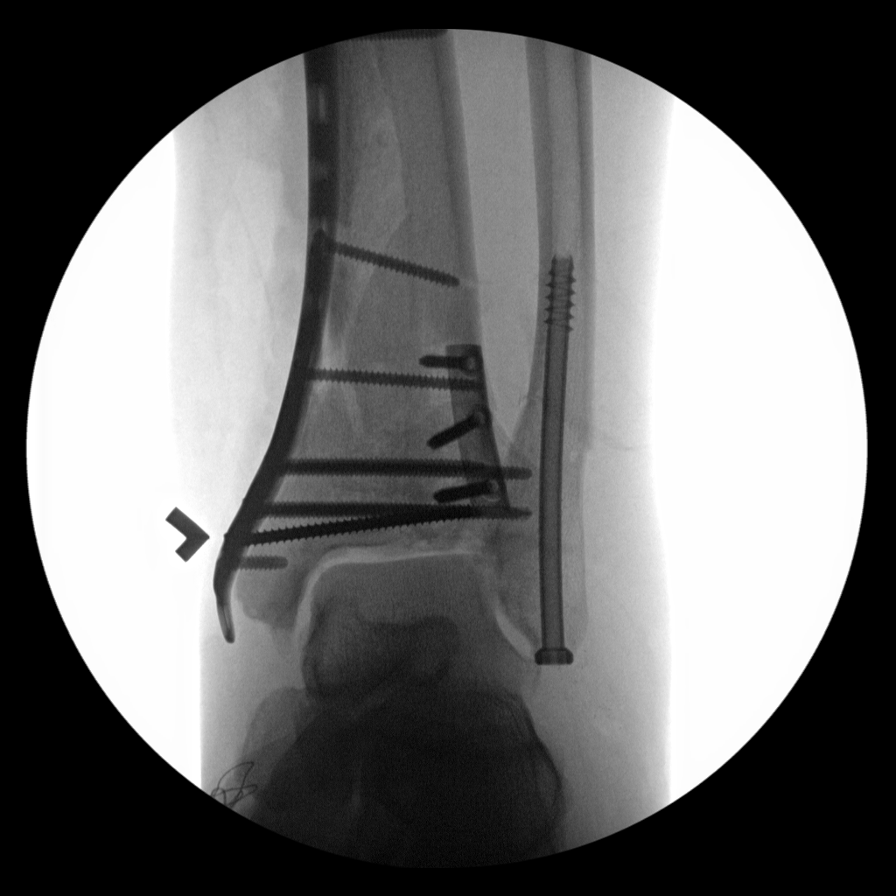
[im 4/7]
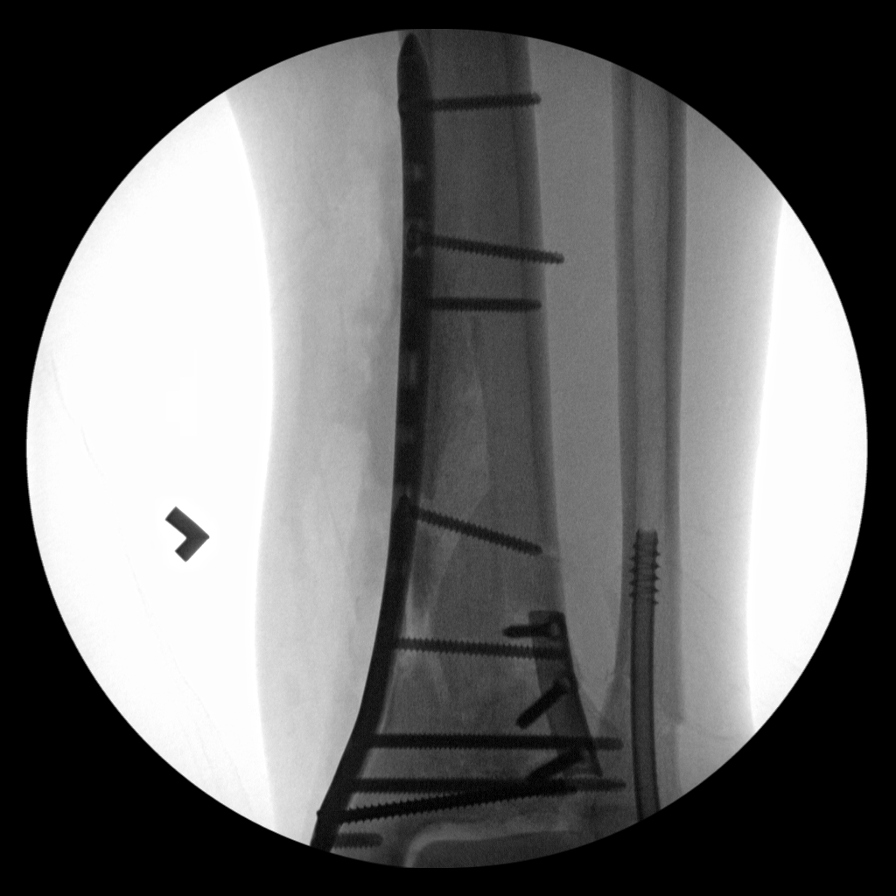
[im 5/7]
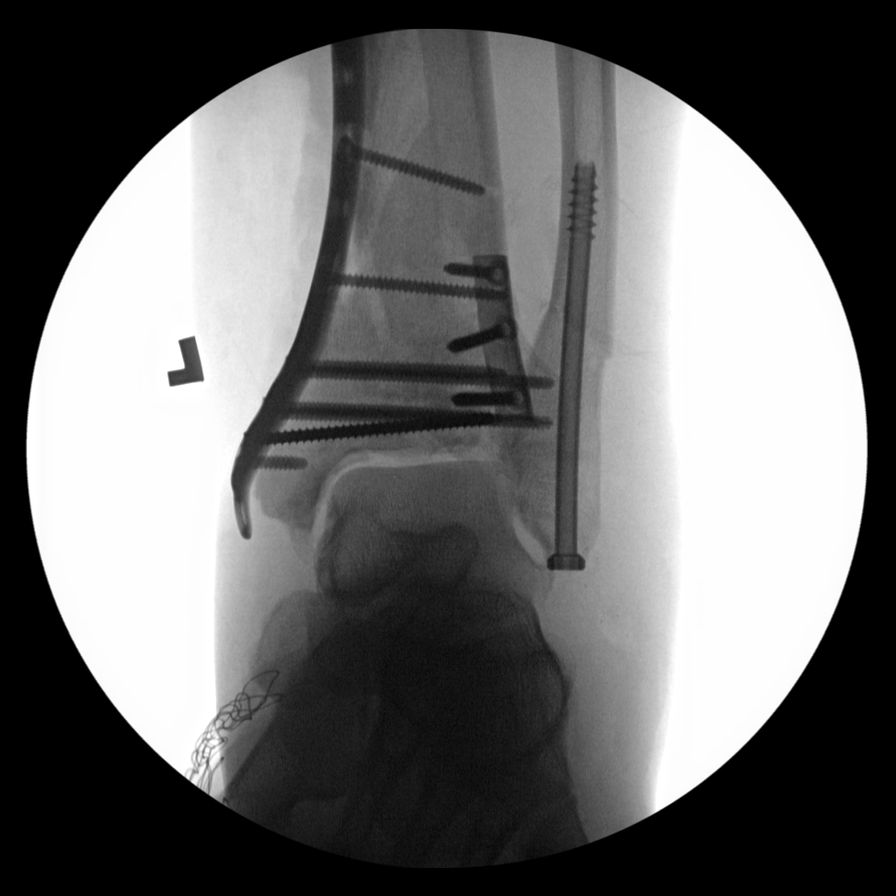
[im 6/7]
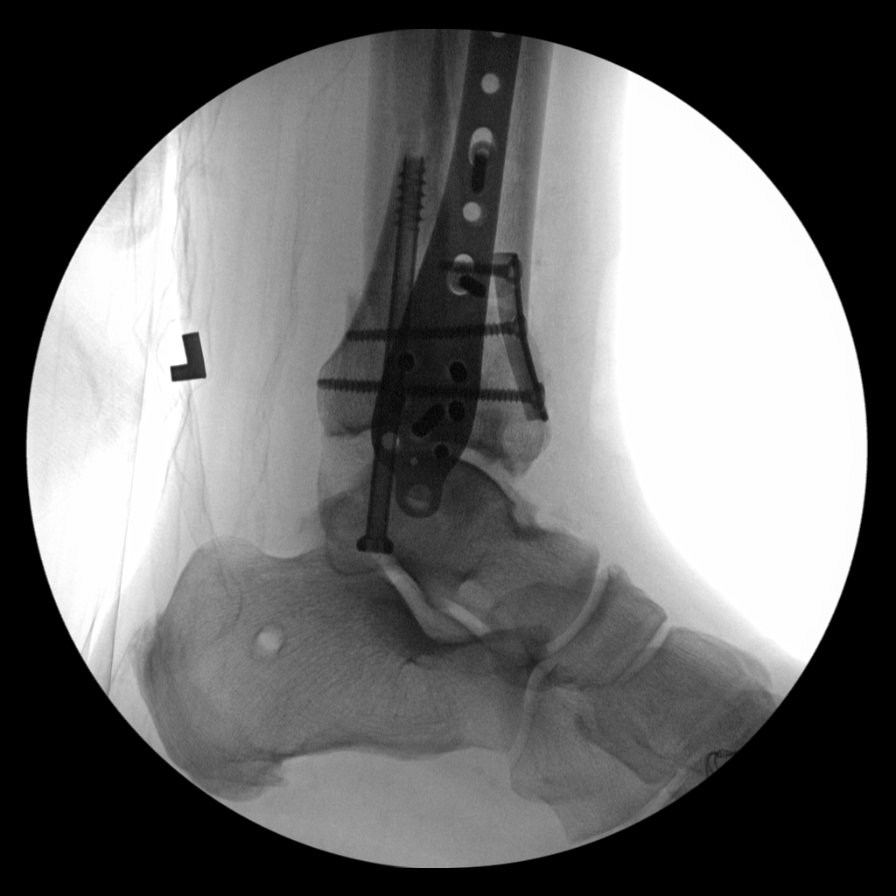
[im 7/7]
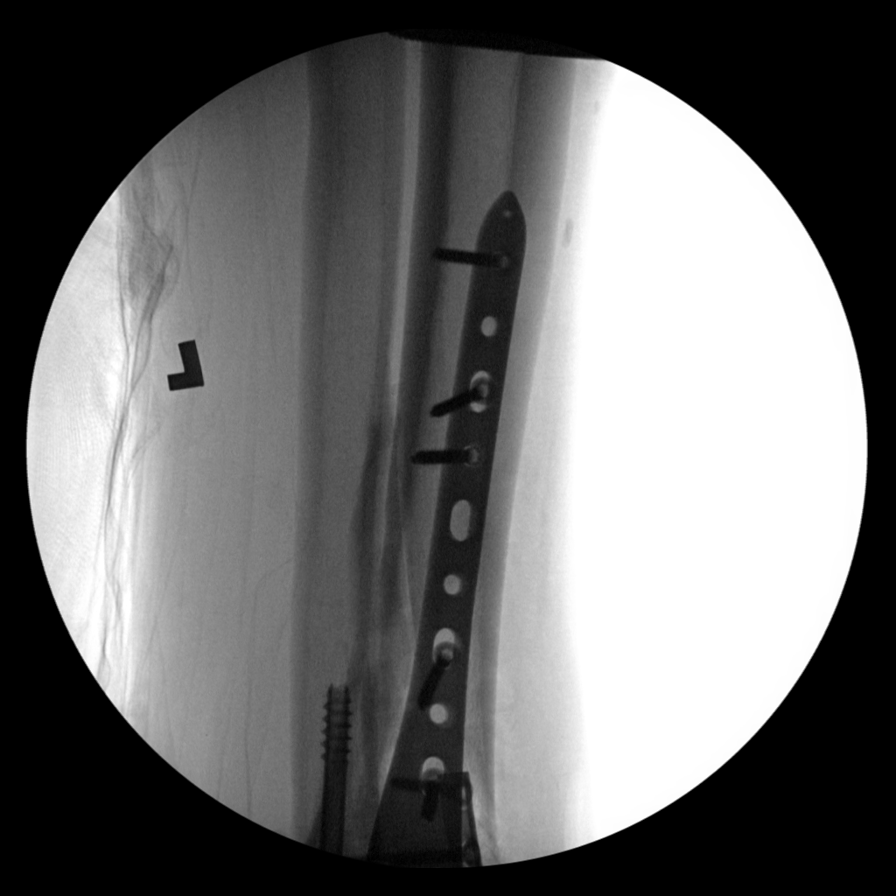

[7 of 7 positions shown; findings below may reference images not displayed]

FINDINGS: Multiple C-arm images show removal of the external fixator and
performance of complex ORIF for treatment of comminuted distal
tibial fracture in transverse fracture of the distal fibula. Plate
screws appear well positioned and overall there is restoration of
anatomic position and alignment.
IMPRESSION: Good appearance following ORIF distal tibia and fibula.

## 2018-01-02 ENCOUNTER — Other Ambulatory Visit: Payer: Self-pay | Admitting: Orthopedic Surgery

## 2018-01-02 DIAGNOSIS — S82872K Displaced pilon fracture of left tibia, subsequent encounter for closed fracture with nonunion: Secondary | ICD-10-CM

## 2018-01-09 ENCOUNTER — Ambulatory Visit
Admission: RE | Admit: 2018-01-09 | Discharge: 2018-01-09 | Disposition: A | Discharge status: Home / Self Care | Payer: Commercial Managed Care - PPO | Source: Ambulatory Visit | Attending: Orthopedic Surgery | Admitting: Orthopedic Surgery

## 2018-01-09 ENCOUNTER — Other Ambulatory Visit: Payer: Self-pay

## 2018-01-09 DIAGNOSIS — S82872K Displaced pilon fracture of left tibia, subsequent encounter for closed fracture with nonunion: Secondary | ICD-10-CM

## 2019-09-04 ENCOUNTER — Ambulatory Visit
Admission: EM | Admit: 2019-09-04 | Discharge: 2019-09-04 | Disposition: A | Discharge status: Home / Self Care | Payer: Commercial Managed Care - PPO | Attending: Physician Assistant | Admitting: Physician Assistant

## 2019-09-04 ENCOUNTER — Ambulatory Visit (INDEPENDENT_AMBULATORY_CARE_PROVIDER_SITE_OTHER): Payer: Commercial Managed Care - PPO

## 2019-09-04 DIAGNOSIS — M25572 Pain in left ankle and joints of left foot: Secondary | ICD-10-CM

## 2019-09-04 MED ORDER — MELOXICAM 7.5 MG PO TABS: 7.5000 mg | ORAL_TABLET | Freq: Every day | ORAL | 0 refills | Status: DC

## 2019-09-04 MED ORDER — MUPIROCIN 2 % EX OINT: 1.0000 "application " | TOPICAL_OINTMENT | Freq: Two times a day (BID) | CUTANEOUS | 0 refills | Status: DC
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
## 2019-09-04 NOTE — ED Triage Notes (Signed)
Pt c/o lt ankle pain since Sunday. Denies injury. State took OTC with no relief. Pt currently has a pain patch to that area. States pain worse when walking.

## 2019-09-04 NOTE — Discharge Instructions (Addendum)
Xray shows arthritis and possible loosening of surgical screw. Please put on cam walker (boot) when you get home and can continue using walker as support. Start Mobic. Do not take ibuprofen (motrin/advil)/ naproxen (aleve) while on mobic. Continue tylenol for further pain relief. Apply bactroban to wound. Ice compress to the ankle. Follow up with Dr Marcelino Scot if symptoms not improving for further evaluation.

## 2019-09-05 ENCOUNTER — Telehealth: Payer: Self-pay | Admitting: Emergency Medicine

## 2019-09-05 NOTE — Telephone Encounter (Signed)
Left voicemail to check in on patient, discuss medications, and encouraged return call with any continuing questions or concerns.

## 2019-12-08 ENCOUNTER — Ambulatory Visit
Admission: EM | Admit: 2019-12-08 | Discharge: 2019-12-08 | Disposition: A | Discharge status: Home / Self Care | Payer: Commercial Managed Care - PPO | Attending: Emergency Medicine | Admitting: Emergency Medicine

## 2019-12-08 ENCOUNTER — Other Ambulatory Visit: Payer: Self-pay

## 2019-12-08 ENCOUNTER — Encounter: Payer: Self-pay | Admitting: Emergency Medicine

## 2019-12-08 ENCOUNTER — Ambulatory Visit (INDEPENDENT_AMBULATORY_CARE_PROVIDER_SITE_OTHER): Payer: Commercial Managed Care - PPO

## 2019-12-08 DIAGNOSIS — M25572 Pain in left ankle and joints of left foot: Secondary | ICD-10-CM

## 2019-12-08 DIAGNOSIS — R2242 Localized swelling, mass and lump, left lower limb: Secondary | ICD-10-CM | POA: Diagnosis not present

## 2019-12-08 MED ORDER — MELOXICAM 7.5 MG PO TABS: 7.5000 mg | ORAL_TABLET | Freq: Every day | ORAL | 0 refills | Status: DC

## 2019-12-08 MED ORDER — MUPIROCIN 2 % EX OINT: 1.0000 "application " | TOPICAL_OINTMENT | Freq: Two times a day (BID) | CUTANEOUS | 0 refills | Status: DC
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
## 2019-12-08 NOTE — Discharge Instructions (Addendum)
Very important to follow-up with orthopedics for further evaluation and management regarding chronic left ankle pain and swelling as well as surveillance of hardware. May take Mobic as directed, Bactroban over any open wounds. Please return sooner for worsening pain, swelling, redness with pain/warmth, fever.

## 2019-12-08 NOTE — ED Triage Notes (Addendum)
Pt presents to [facility] for assessment of left ankle pain x 1 week.  States hx of same in September which was relieved with treatments at that time.  Denies known injury.  Pt is up and down a lot and walking a lot at work ([employer]).  Pt states his ankle "leaks" when he gets out of a nice warm shower.  Endorses pain to medial and posterior aspect of ankle.

## 2019-12-08 NOTE — ED Notes (Signed)
Patient able to ambulate independently with walker.

## 2019-12-31 ENCOUNTER — Other Ambulatory Visit: Payer: Self-pay | Admitting: Orthopedic Surgery

## 2019-12-31 DIAGNOSIS — S82872D Displaced pilon fracture of left tibia, subsequent encounter for closed fracture with routine healing: Secondary | ICD-10-CM

## 2020-01-05 ENCOUNTER — Other Ambulatory Visit: Payer: Self-pay | Admitting: Orthopedic Surgery

## 2020-01-05 DIAGNOSIS — S82872D Displaced pilon fracture of left tibia, subsequent encounter for closed fracture with routine healing: Secondary | ICD-10-CM

## 2020-01-06 ENCOUNTER — Ambulatory Visit
Admission: RE | Admit: 2020-01-06 | Discharge: 2020-01-06 | Disposition: A | Discharge status: Home / Self Care | Payer: Commercial Managed Care - PPO | Source: Ambulatory Visit | Attending: Orthopedic Surgery | Admitting: Orthopedic Surgery

## 2020-01-06 DIAGNOSIS — S82872D Displaced pilon fracture of left tibia, subsequent encounter for closed fracture with routine healing: Secondary | ICD-10-CM

## 2022-02-26 ENCOUNTER — Other Ambulatory Visit: Payer: Self-pay

## 2022-02-26 ENCOUNTER — Ambulatory Visit (INDEPENDENT_AMBULATORY_CARE_PROVIDER_SITE_OTHER): Payer: 59

## 2022-02-26 ENCOUNTER — Ambulatory Visit
Admission: EM | Admit: 2022-02-26 | Discharge: 2022-02-26 | Disposition: A | Discharge status: Home / Self Care | Payer: 59 | Attending: Physician Assistant | Admitting: Physician Assistant

## 2022-02-26 DIAGNOSIS — M25561 Pain in right knee: Secondary | ICD-10-CM | POA: Diagnosis not present

## 2022-02-26 MED ORDER — PREDNISONE 20 MG PO TABS: 40.0000 mg | ORAL_TABLET | Freq: Every day | ORAL | 0 refills | Status: AC

## 2022-02-26 NOTE — ED Triage Notes (Signed)
Pt presents with right knee pain X 1 week that is unrelieved with OTC medication; pt states he had a fall a few weeks ago. ?
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
## 2022-09-06 ENCOUNTER — Ambulatory Visit: Payer: Commercial Managed Care - PPO | Admitting: Neurology

## 2022-09-06 ENCOUNTER — Encounter: Payer: Self-pay | Admitting: Neurology

## 2022-09-06 VITALS — BP 151/98 | HR 120 | Ht 70.0 in | Wt 238.0 lb

## 2022-09-06 DIAGNOSIS — R200 Anesthesia of skin: Secondary | ICD-10-CM

## 2022-09-06 DIAGNOSIS — G729 Myopathy, unspecified: Secondary | ICD-10-CM

## 2022-09-06 DIAGNOSIS — R29898 Other symptoms and signs involving the musculoskeletal system: Secondary | ICD-10-CM | POA: Diagnosis not present

## 2022-09-06 DIAGNOSIS — M6281 Muscle weakness (generalized): Secondary | ICD-10-CM | POA: Diagnosis not present

## 2022-09-06 DIAGNOSIS — R29818 Other symptoms and signs involving the nervous system: Secondary | ICD-10-CM

## 2022-09-06 DIAGNOSIS — M62552 Muscle wasting and atrophy, not elsewhere classified, left thigh: Secondary | ICD-10-CM
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
## 2022-09-06 NOTE — Patient Instructions (Signed)
Blood test today EMG/NCS to evaluate for nerve or muscle disease MRI left thigh to evaluate for muscle disease and an MRI brain to evaluate for stroke  Electromyoneurogram Electromyoneurogram is a test to check how well your muscles and nerves are working. This procedure includes the combined use of electromyogram (EMG) and nerve conduction study (NCS). EMG is used to evaluate muscles and the nerves that control those muscles. NCS, which is also called electroneurogram, measures how well your nerves conduct electricity. The procedures should be done together to check if your muscles and nerves are healthy. If the results of the tests are abnormal, this may indicate disease or injury, such as a neuromuscular disease or peripheral nerve damage. Tell a health care provider about: Any allergies you have. All medicines you are taking, including vitamins, herbs, eye drops, creams, and over-the-counter medicines. Any bleeding problems you have. Any surgeries you have had. Any medical conditions you have. What are the risks? Generally, this is a safe procedure. However, problems may occur, including: Bleeding or bruising. Infection where the electrodes were inserted. What happens before the test? Medicines Take all of your usually prescribed medications before this testing is performed. Do not stop your blood thinners unless advised by your prescribing physician. General instructions Your health care provider may ask you to warm the limb that will be checked with warm water, hot pack, or wrapping the limb in a blanket. Do not use lotions or creams on the same day that you will be having the procedure. What happens during the test? For EMG  Your health care provider will ask you to stay in a position so that the muscle being studied can be accessed. You will be sitting or lying down. You may be given a medicine to numb the area (local anesthetic) and the skin will be disinfected. A very thin  needle that has an electrode will be inserted into your muscle, one muscle at a time. Typically, multiple muscles are evaluated during a single study. Another small electrode will be placed on your skin near the muscle. Your health care provider will ask you to continue to remain still. The electrodes will record the electrical activity of your muscles. You may see this on a monitor or hear it in the room. After your muscles have been studied at rest, your health care provider will ask you to contract or flex your muscles. The electrodes will record the electrical activity of your muscles. Your health care provider will remove the electrodes and the electrode needle when the procedure is finished. The procedure may vary among health care providers and hospitals. For NCS  An electrode that records your nerve activity (recording electrode) will be placed on your skin by the muscle that is being studied. An electrode that is used as a reference (reference electrode) will be placed near the recording electrode. A paste or gel will be applied to your skin between the recording electrode and the reference electrode. Your nerve will be stimulated with a mild shock. The speed of the nerves and strength of response is recorded by the electrodes. Your health care provider will remove the electrodes and the gel when the procedure is finished. The procedure may vary among health care providers and hospitals. What can I expect after the test? It is up to you to get your test results. Ask your health care provider, or the department that is doing the test, when your results will be ready. Your health care provider may: Give you  medicines for any pain. Monitor the insertion sites to make sure that bleeding stops. You should be able to drive yourself to and from the test. Discomfort can persist for a few hours after the test, but should be better the next day. Contact a health care provider if: You have  swelling, redness, or drainage at any of the insertion sites. Summary Electromyoneurogram is a test to check how well your muscles and nerves are working. If the results of the tests are abnormal, this may indicate disease or injury. This is a safe procedure. However, problems may occur, such as bleeding and infection. Your health care provider will do two tests to complete this procedure. One checks your muscles (EMG) and another checks your nerves (NCS). It is up to you to get your test results. Ask your health care provider, or the department that is doing the test, when your results will be ready. This information is not intended to replace advice given to you by your health care provider. Make sure you discuss any questions you have with your health care provider. Document Revised: 08/10/2021 Document Reviewed: 07/10/2021 Elsevier Patient Education  [place].

## 2022-09-07 ENCOUNTER — Telehealth: Payer: Self-pay | Admitting: *Deleted

## 2022-09-07 ENCOUNTER — Encounter: Payer: Self-pay | Admitting: Neurology

## 2022-09-07 LAB — CK: Total CK: 96 U/L (ref 41–331)

## 2022-09-07 NOTE — Telephone Encounter (Signed)
Called pt & LVM asking for call back. When he calls back, please let him know that Dr Jaynee Eagles reviewed his blood work. His muscle enzymes are normal. This is good news, it means there is no abnormal muscle break down going on. He will see Dr Krista Blue for EMG next Wednesday 10/4 at 10:30 arrival 10:00.

## 2022-09-07 NOTE — Telephone Encounter (Signed)
-----   Message from Melvenia Beam, MD sent at 09/07/2022  3:22 PM EDT ----- Please let patient know: Your muscle enzymes are normal, this is good news, it means there is no abnormal muscle break down going on. Thanks (cc dr Krista Blue as an fyi because she saw this patient with me and I thought she'd be interested in his normal CK)

## 2022-09-12 NOTE — Telephone Encounter (Signed)
Called pt a second time and LVM asking for call back. When he calls back, give him message in bold below. He will also be here tomorrow for EMG. Can give results to him then if he doesn't call back.

## 2022-09-13 ENCOUNTER — Ambulatory Visit (INDEPENDENT_AMBULATORY_CARE_PROVIDER_SITE_OTHER): Payer: Commercial Managed Care - PPO | Admitting: Neurology

## 2022-09-13 ENCOUNTER — Encounter: Payer: Self-pay | Admitting: Neurology

## 2022-09-13 ENCOUNTER — Ambulatory Visit: Payer: Commercial Managed Care - PPO | Admitting: Neurology

## 2022-09-13 VITALS — BP 149/87 | HR 122 | Ht 70.0 in | Wt 238.0 lb

## 2022-09-13 DIAGNOSIS — R29898 Other symptoms and signs involving the musculoskeletal system: Secondary | ICD-10-CM

## 2022-09-13 DIAGNOSIS — M6281 Muscle weakness (generalized): Secondary | ICD-10-CM

## 2022-09-13 DIAGNOSIS — R200 Anesthesia of skin: Secondary | ICD-10-CM | POA: Diagnosis not present
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
## 2022-09-13 NOTE — Procedures (Signed)
Full Name: [name] [name] Gender: Male MRN #: [id number] Date of Birth: [birth year]    Visit Date: 09/13/2022 11:19 Age: 61 Years Examining Physician: Levert Feinstein  Referring Physician: Lucia Gaskins Height: 5 feet 10 inch History: 61 year old male with history of left ankle injury, multiple left ankle surgery now presenting with subacute worsening left lower extremity pain, weakness, reported history of diabetes  Summary of the test: Nerve conduction study: Left lower extremity nerve conduction study is limited by left lower extremity significant swelling, left sural, superficial peroneal sensory responses were absent.  Left tibial motor responses showed significantly decreased CMAP amplitude, mildly prolonged distal latency slow conduction velocity.  Right tibial motor responses was within normal limit.  Right superficial peroneal sensory responses was absent, this is also limited by his mild right lower extremity swelling, left skin  Left median, ulnar sensory and motor responses were normal.  Electromyography: Selected needle examination were performed at bilateral lower extremity muscles, left upper extremity muscles, left lumbosacral paraspinal muscles, left cervical paraspinal muscles, and the left median thoracic paraspinal muscles.  There were performed spontaneous activity at left tibialis anterior, tibialis posterior, peroneal longus, medial gastrocnemius, vastus medialis, vastus lateralis, rectus femoris, with no active motor unit potentials seen.  There are also increased insertional activity, moderate spontaneous activity at left  biceps short head, long head, complex motor unit potential was decreased recruitment patterns.  There was increased insertional activity, 2+ spontaneous activity, complex motor unit potential at abductor longus  Needle examination of left gluteus medius showed no significant abnormality.  There also spontaneous activity at left lower lumbar  paraspinal muscles.  Selected needle examination of right lower extremity muscles showed mild enlarged motor unit potential with slight decreased recruitment patterns, mainly involving right tibialis anterior, peroneal longus.  There was no spontaneous activity at left thoracic paraspinal muscles, T9, T10.  Needle examination of left upper extremity muscles and cervical paraspinal muscles showed no significant abnormality.  Conclusion: This is a marked abnormal study.  There is evidence of active neuropathic changes involving left lower extremity muscles, involving left  L2-S1 myotomes.  This happened in the setting of mild length-dependent peripheral neuropathy, and limited left lower extremity nerve conduction study due to left lower extremity swelling.  Differentiation diagnosis include left lumbosacral plexopathy, versus left lower extremity multiple neuropathy involving left femoral, obturator, sciatic nerves.  MRI of pelvic with without contrast is planned    ------------------------------- Levert Feinstein, M.D. Ph.D.   [facility] [address] [place], [place] [address] Tel: [phone] Fax: [phone]  Verbal informed consent was obtained from the patient, patient was informed of potential risk of procedure, including bruising, bleeding, hematoma formation, infection, muscle weakness, muscle pain, numbness, among others.        MNC    Nerve / Sites Muscle Latency Ref. Amplitude Ref. Rel Amp Segments Distance Velocity Ref. Area    ms ms mV mV %  cm m/s m/s mVms  L Median - APB     Wrist APB 4.1 ?4.4 9.5 ?4.0 100 Wrist - APB 7   29.5     Upper arm APB 8.2  9.1  96.2 Upper arm - Wrist 22 53 ?49 29.4  L Ulnar - ADM     Wrist ADM 3.2 ?3.3 8.3 ?6.0 100 Wrist - ADM 7   23.9     B.Elbow ADM 5.8  7.6  91.8 B.Elbow - Wrist 16 60 ?49 22.5     A.Elbow ADM  9.0  7.0  92.2 A.Elbow - B.Elbow 16 51 ?49 21.7  L Tibial - AH     Ankle AH 6.9 ?5.8 0.7 ?4.0 100 Ankle -  AH 9   1.8     Pop fossa AH 18.0  1.0  144 Pop fossa - Ankle 44 40 ?41 4.2  R Tibial - AH     Ankle AH 5.5 ?5.8 3.8 ?4.0 100 Ankle - AH 9   8.1     Pop fossa AH 16.3  3.8  101 Pop fossa - Ankle 48 44 ?41 10.4             SNC    Nerve / Sites Rec. Site Peak Lat Ref.  Amp Ref. Segments Distance    ms ms V V  cm  L Sural - Ankle (Calf) (1)     Calf Ankle NR ?4.4 NR ?6 Calf - Ankle 14  L Superficial peroneal - Ankle     Lat leg Ankle NR ?4.4 NR ?6 Lat leg - Ankle 14  R Superficial peroneal - Ankle     Lat leg Ankle NR ?4.4 NR ?6 Lat leg - Ankle 14  L Median - Orthodromic (Dig II, Mid palm)     Dig II Wrist 3.2 ?3.4 11 ?10 Dig II - Wrist 13  L Ulnar - Orthodromic, (Dig V, Mid palm)     Dig V Wrist 2.6 ?3.1 5 ?5 Dig V - Wrist 75               F  Wave    Nerve F Lat Ref.   ms ms  L Ulnar - ADM 30.5 ?32.0  L Tibial - AH 32.1 ?56.0  R Tibial - AH 39.4 ?56.0           EMG Summary Table    Spontaneous MUAP Recruitment  Muscle IA Fib PSW Fasc Other Amp Dur. Poly Pattern  L. Tibialis anterior Increased 3+ 3+ None _______ Normal Normal Normal Normal  L. Tibialis posterior Increased 3+ 3+ None _______ Normal Normal Normal Normal  L. Peroneus longus Increased 3+ 3+ None _______ Normal Normal Normal Normal  L. Vastus lateralis Increased 3+ 3+ None _______ Normal Normal Normal Normal  L. Vastus medialis Increased 3+ 3+ None _______ Normal Normal Normal Normal  L. Biceps femoris (short head) Increased 1+ None None _______ Increased Increased 1+ Reduced  L. Biceps femoris (long head) Increased 1+ None None _______ Increased Increased 1+ Reduced  L. Adductor longus Increased 2+ 2+ None _______ Normal Normal Normal Normal  L. Lumbar paraspinals (low) Increased 1+ None None _______ Normal Normal Normal Normal  L. Lumbar paraspinals (mid) Normal None None None _______ Normal Normal Normal Normal  R. Tibialis anterior Increased None None None _______ Increased Increased 1+ Reduced  R. Peroneus  longus Increased None None None _______ Increased Increased 1+ Reduced  R. Gastrocnemius (Medial head) Increased None None None _______ Normal Normal Normal Reduced  R. Vastus lateralis Normal None None None _______ Normal Normal Normal Normal  R. Lumbar paraspinals (low) Normal None None None _______ Normal Normal Normal Normal  R. Lumbar paraspinals (mid) Normal None None None _______ Normal Normal Normal Normal  L. First dorsal interosseous Normal None None None _______ Normal Normal Normal Normal  L. Biceps brachii Normal None None None _______ Normal Normal Normal Normal  L. Deltoid Normal None None None _______ Normal Normal Normal Normal  L. Triceps brachii Normal None None None _______ Normal Normal Normal  Normal  L. Cervical paraspinals Normal None None None _______ Normal Normal Normal Normal  L. Thoracic Paraspinals Normal None None None _______ Normal Normal Normal Normal

## 2022-09-14 ENCOUNTER — Encounter: Payer: Self-pay | Admitting: *Deleted

## 2022-09-14 ENCOUNTER — Telehealth: Payer: Self-pay | Admitting: Neurology

## 2022-09-14 NOTE — Telephone Encounter (Signed)
I spoke with the patient. I informed him of the lab results. He will follow up with Dr. Arelia Sneddon for control of his diabetes. He verbalized understanding and expressed appreciation for the call. All questions answered.  He reports a fall today. He was home alone at the time. He was able to ambulate by himself. He denies any injuries related to the fall.

## 2022-09-14 NOTE — Telephone Encounter (Signed)
Please call patient, laboratory evaluation showed elevated A1c 8.6, consistent with poorly controlled diabetes, I did glucose 231  Rest of the laboratory evaluation showed no significant abnormalities, TSH is pending  I have forwarded the lab result to his primary care Leonard Downing, MD   He should contact Dr. Arelia Sneddon for control of his diabetes

## 2022-09-15 ENCOUNTER — Telehealth: Payer: Self-pay | Admitting: Neurology

## 2022-09-15 NOTE — Telephone Encounter (Signed)
All 3 scans are pending [facility] website for [facility].

## 2022-09-15 NOTE — Progress Notes (Signed)
EMG report Is under procedure

## 2022-09-16 LAB — COMPREHENSIVE METABOLIC PANEL
ALT: 10 IU/L (ref 0–44)
AST: 12 IU/L (ref 0–40)
Albumin/Globulin Ratio: 1.7 (ref 1.2–2.2)
Albumin: 4.2 g/dL (ref 3.8–4.9)
Alkaline Phosphatase: 66 IU/L (ref 44–121)
BUN/Creatinine Ratio: 16 (ref 9–20)
BUN: 10 mg/dL (ref 6–24)
Bilirubin Total: 0.5 mg/dL (ref 0.0–1.2)
CO2: 21 mmol/L (ref 20–29)
Calcium: 9.9 mg/dL (ref 8.7–10.2)
Chloride: 101 mmol/L (ref 96–106)
Creatinine, Ser: 0.62 mg/dL — ABNORMAL LOW (ref 0.76–1.27)
Globulin, Total: 2.5 g/dL (ref 1.5–4.5)
Glucose: 231 mg/dL — ABNORMAL HIGH (ref 70–99)
Potassium: 4.4 mmol/L (ref 3.5–5.2)
Sodium: 141 mmol/L (ref 134–144)
Total Protein: 6.7 g/dL (ref 6.0–8.5)
eGFR: 111 mL/min/{1.73_m2} (ref >59)

## 2022-09-16 LAB — CBC WITH DIFFERENTIAL/PLATELET
Basophils Absolute: 0.1 10*3/uL (ref 0.0–0.2)
Basos: 1 %
EOS (ABSOLUTE): 0.1 10*3/uL (ref 0.0–0.4)
Eos: 1 %
Hematocrit: 45.5 % (ref 37.5–51.0)
Hemoglobin: 15.5 g/dL (ref 13.0–17.7)
Immature Grans (Abs): 0 10*3/uL (ref 0.0–0.1)
Immature Granulocytes: 0 %
Lymphocytes Absolute: 3.8 10*3/uL — ABNORMAL HIGH (ref 0.7–3.1)
Lymphs: 32 %
MCH: 29.8 pg (ref 26.6–33.0)
MCHC: 34.1 g/dL (ref 31.5–35.7)
MCV: 88 fL (ref 79–97)
Monocytes Absolute: 0.9 10*3/uL (ref 0.1–0.9)
Monocytes: 7 %
Neutrophils Absolute: 6.9 10*3/uL (ref 1.4–7.0)
Neutrophils: 59 %
Platelets: 289 10*3/uL (ref 150–450)
RBC: 5.2 x10E6/uL (ref 4.14–5.80)
RDW: 13.7 % (ref 11.6–15.4)
WBC: 11.8 10*3/uL — ABNORMAL HIGH (ref 3.4–10.8)

## 2022-09-16 LAB — RPR: RPR Ser Ql: NONREACTIVE

## 2022-09-16 LAB — LYME DISEASE SEROLOGY W/REFLEX: Lyme Total Antibody EIA: NEGATIVE

## 2022-09-16 LAB — TSH: TSH: 2.47 u[IU]/mL (ref 0.450–4.500)

## 2022-09-16 LAB — VITAMIN B12: Vitamin B-12: 752 pg/mL (ref 232–1245)

## 2022-09-16 LAB — HIV ANTIBODY (ROUTINE TESTING W REFLEX): HIV Screen 4th Generation wRfx: NONREACTIVE

## 2022-09-16 LAB — SEDIMENTATION RATE: Sed Rate: 14 mm/hr (ref 0–30)

## 2022-09-16 LAB — C-REACTIVE PROTEIN: CRP: <1 mg/L (ref 0–10)

## 2022-09-16 LAB — HGB A1C W/O EAG: Hgb A1c MFr Bld: 8.6 % — ABNORMAL HIGH (ref 4.8–5.6)

## 2022-09-16 LAB — ANA W/REFLEX IF POSITIVE: Anti Nuclear Antibody (ANA): NEGATIVE

## 2022-09-18 NOTE — Telephone Encounter (Signed)
[facility] NPR Case ID:  [id number] sent to [facility]

## 2022-09-25 ENCOUNTER — Telehealth: Payer: Self-pay | Admitting: Neurology

## 2022-09-25 NOTE — Telephone Encounter (Signed)
Called pt and got him scheduled for an hour on 11/14 at 11:00 am. I went ahead and opened up both 11/14 and 11/15 as well.

## 2022-09-25 NOTE — Telephone Encounter (Signed)
Jillian/Angel I need a follow up with patient at least an hour after 10/21 (he gets imaging that day). I am going to open up Nov 14 and 15 so please give me an hour  with him on one of those days unless you can find an hour appointment before(but after the 21st) then thanks  Megan - Can you open up Novemner 14 and 15th for me please? I may not to go the [employer] meeting unril Thiurday so I can see patients on Tuesday and wedneday. Thanks

## 2022-09-30 ENCOUNTER — Ambulatory Visit (HOSPITAL_COMMUNITY)
Admission: RE | Admit: 2022-09-30 | Discharge: 2022-09-30 | Disposition: A | Discharge status: Home / Self Care | Payer: Commercial Managed Care - PPO | Source: Ambulatory Visit | Attending: Neurology | Admitting: Neurology

## 2022-09-30 DIAGNOSIS — R200 Anesthesia of skin: Secondary | ICD-10-CM | POA: Diagnosis present

## 2022-09-30 DIAGNOSIS — R29898 Other symptoms and signs involving the musculoskeletal system: Secondary | ICD-10-CM | POA: Insufficient documentation

## 2022-09-30 DIAGNOSIS — M6281 Muscle weakness (generalized): Secondary | ICD-10-CM | POA: Insufficient documentation

## 2022-09-30 DIAGNOSIS — G729 Myopathy, unspecified: Secondary | ICD-10-CM | POA: Insufficient documentation

## 2022-09-30 DIAGNOSIS — M62552 Muscle wasting and atrophy, not elsewhere classified, left thigh: Secondary | ICD-10-CM | POA: Diagnosis present

## 2022-09-30 DIAGNOSIS — R29818 Other symptoms and signs involving the nervous system: Secondary | ICD-10-CM | POA: Diagnosis present

## 2022-09-30 MED ORDER — GADOBUTROL 1 MMOL/ML IV SOLN
10.0000 mL | Freq: Once | INTRAVENOUS | Status: AC | PRN
  Administered 2022-09-30: 10 mL via INTRAVENOUS

## 2022-10-09 ENCOUNTER — Telehealth: Payer: Self-pay | Admitting: *Deleted

## 2022-10-09 NOTE — Telephone Encounter (Addendum)
Spoke with patient and discussed his MRI results. Pt verbalized understanding and his questions were answered. He will come earlier, this Wed 11/1 at 2-215 pm for a 230 appt. His 11/14 appt has been canceled due to being seen sooner. He asked about his MRI pelvis results as well.   ----- Message from Melvenia Beam, MD sent at 10/06/2022  1:20 PM EDT ----- MRI of the brain is unremarkable.  Nothing seen that is concerning.   Melvenia Beam, MD  10/06/2022  1:22 PM EDT     MRI of the thigh did show nerve problems within the muscles, I would like to try to move his appointment up earlier than November 14 if possible.  We can talk about the results then.  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

## 2022-10-10 NOTE — Telephone Encounter (Signed)
Noted  

## 2022-10-10 NOTE — Telephone Encounter (Signed)
I will speak with him Wednesday thanks

## 2022-10-11 ENCOUNTER — Encounter: Payer: Self-pay | Admitting: Neurology

## 2022-10-11 ENCOUNTER — Ambulatory Visit (INDEPENDENT_AMBULATORY_CARE_PROVIDER_SITE_OTHER): Payer: Commercial Managed Care - PPO | Admitting: Neurology

## 2022-10-11 VITALS — BP 129/95 | HR 121 | Ht 70.0 in | Wt 240.0 lb

## 2022-10-11 DIAGNOSIS — E1144 Type 2 diabetes mellitus with diabetic amyotrophy: Secondary | ICD-10-CM | POA: Diagnosis not present

## 2022-10-11 DIAGNOSIS — R29898 Other symptoms and signs involving the musculoskeletal system: Secondary | ICD-10-CM | POA: Diagnosis not present

## 2022-10-11 DIAGNOSIS — R269 Unspecified abnormalities of gait and mobility: Secondary | ICD-10-CM

## 2022-10-11 DIAGNOSIS — G709 Myoneural disorder, unspecified: Secondary | ICD-10-CM

## 2022-10-11 DIAGNOSIS — Z9181 History of falling: Secondary | ICD-10-CM

## 2022-10-11 NOTE — Patient Instructions (Addendum)
Diabetic Amyotrophy:  Muscle biopsy PT - home PT Send disability paperwork Continue to manage glucose Will contact Dr. Seth Bake about swelling and white blood cells
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
## 2022-10-12 ENCOUNTER — Telehealth: Payer: Self-pay | Admitting: Neurology

## 2022-10-12 NOTE — Telephone Encounter (Signed)
Referral sent to [facility], phone # [phone].

## 2022-10-12 NOTE — Telephone Encounter (Signed)
Sent home health referral to [facility] with [facility], he will reach out to pt next week.

## 2022-10-24 ENCOUNTER — Ambulatory Visit: Payer: Commercial Managed Care - PPO | Admitting: Neurology

## 2022-11-09 ENCOUNTER — Ambulatory Visit: Payer: Self-pay | Admitting: Surgery
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
## 2022-11-28 NOTE — Patient Instructions (Signed)
SURGICAL WAITING ROOM VISITATION Patients having surgery or a procedure may have no more than 2 support people in the waiting area - these visitors may rotate.   Children under the age of 3 must have an adult with them who is not the patient. If the patient needs to stay at the hospital during part of their recovery, the visitor guidelines for inpatient rooms apply. Pre-op nurse will coordinate an appropriate time for 1 support person to accompany patient in pre-op.  This support person may not rotate.    Please refer to the [facility] website for the visitor guidelines for Inpatients (after your surgery is over and you are in a regular room).    Your procedure is scheduled on: 12/22/21   Report to [facility] Main Entrance    Report to admitting at 12:00 PM   Call this number if you have problems the morning of surgery [phone]   Do not eat food or drink liquids :After Midnight.          If you have questions, please contact your surgeon's office.   FOLLOW BOWEL PREP AND ANY ADDITIONAL PRE OP INSTRUCTIONS YOU RECEIVED FROM YOUR SURGEON'S OFFICE!!!     Oral Hygiene is also important to reduce your risk of infection.                                    Remember - BRUSH YOUR TEETH THE MORNING OF SURGERY WITH YOUR REGULAR TOOTHPASTE  DENTURES WILL BE REMOVED PRIOR TO SURGERY PLEASE DO NOT APPLY "Poly grip" OR ADHESIVES!!!   Take these medicines the morning of surgery with A SIP OF WATER: Tylenol    Call your doctor and ask for instructions regarding Aspirin before surgery.   DO NOT TAKE ANY ORAL DIABETIC MEDICATIONS DAY OF YOUR SURGERY  How to Manage Your Diabetes Before and After Surgery  Why is it important to control my blood sugar before and after surgery? Improving blood sugar levels before and after surgery helps healing and can limit problems. A way of improving blood sugar control is eating a healthy diet by:  Eating less sugar and carbohydrates  Increasing  activity/exercise  Talking with your doctor about reaching your blood sugar goals High blood sugars (greater than 180 mg/dL) can raise your risk of infections and slow your recovery, so you will need to focus on controlling your diabetes during the weeks before surgery. Make sure that the doctor who takes care of your diabetes knows about your planned surgery including the date and location.  How do I manage my blood sugar before surgery? Check your blood sugar at least 4 times a day, starting 2 days before surgery, to make sure that the level is not too high or low. Check your blood sugar the morning of your surgery when you wake up and every 2 hours until you get to the Short Stay unit. If your blood sugar is less than 70 mg/dL, you will need to treat for low blood sugar: Do not take insulin. Treat a low blood sugar (less than 70 mg/dL) with  cup of clear juice (cranberry or apple), 4 glucose tablets, OR glucose gel. Recheck blood sugar in 15 minutes after treatment (to make sure it is greater than 70 mg/dL). If your blood sugar is not greater than 70 mg/dL on recheck, call [phone] for further instructions. Report your blood sugar to the short stay  nurse when you get to Short Stay.  If you are admitted to the hospital after surgery: Your blood sugar will be checked by the staff and you will probably be given insulin after surgery (instead of oral diabetes medicines) to make sure you have good blood sugar levels. The goal for blood sugar control after surgery is 80-180 mg/dL.   WHAT DO I DO ABOUT MY DIABETES MEDICATION?  Do not take oral diabetes medicines (pills) the morning of surgery.  THE DAY BEFORE SURGERY, take Metformin as prescribed.      THE MORNING OF SURGERY, do not take Metformin.    Reviewed and Endorsed by [facility] Patient Education Committee, August 2015                              You may not have any metal on your body including jewelry, and body piercing              Do not wear lotions, powders, cologne, or deodorant              Men may shave face and neck.   Do not bring valuables to the hospital. [place] IS NOT             RESPONSIBLE   FOR VALUABLES.  DO NOT BRING YOUR HOME MEDICATIONS TO THE HOSPITAL. PHARMACY WILL DISPENSE MEDICATIONS LISTED ON YOUR MEDICATION LIST TO YOU DURING YOUR ADMISSION IN THE HOSPITAL!    Patients discharged on the day of surgery will not be allowed to drive home.  Someone NEEDS to stay with you for the first 24 hours after anesthesia.              Please read over the following fact sheets you were given: IF YOU HAVE QUESTIONS ABOUT YOUR PRE-OP INSTRUCTIONS PLEASE CALL [phone] Contras    If you received a COVID test during your pre-op visit  it is requested that you wear a mask when out in public, stay away from anyone that may not be feeling well and notify your surgeon if you develop symptoms. If you test positive for Covid or have been in contact with anyone that has tested positive in the last 10 days please notify you surgeon.    [place] - Preparing for Surgery Before surgery, you can play an important role.  Because skin is not sterile, your skin needs to be as free of germs as possible.  You can reduce the number of germs on your skin by washing with CHG (chlorahexidine gluconate) soap before surgery.  CHG is an antiseptic cleaner which kills germs and bonds with the skin to continue killing germs even after washing. Please DO NOT use if you have an allergy to CHG or antibacterial soaps.  If your skin becomes reddened/irritated stop using the CHG and inform your nurse when you arrive at Short Stay. Do not shave (including legs and underarms) for at least 48 hours prior to the first CHG shower.  You may shave your face/neck.  Please follow these instructions carefully:  1.  Shower with CHG Soap the night before surgery and the  morning of surgery.  2.  If you choose to wash your hair, wash your  hair first as usual with your normal  shampoo.  3.  After you shampoo, rinse your hair and body thoroughly to remove the shampoo.  4.  Use CHG as you would any other liquid soap.  You can apply chg directly to the skin and wash.  Gently with a scrungie or clean washcloth.  5.  Apply the CHG Soap to your body ONLY FROM THE NECK DOWN.   Do   not use on face/ open                           Wound or open sores. Avoid contact with eyes, ears mouth and   genitals (private parts).                       Wash face,  Genitals (private parts) with your normal soap.             6.  Wash thoroughly, paying special attention to the area where your    surgery  will be performed.  7.  Thoroughly rinse your body with warm water from the neck down.  8.  DO NOT shower/wash with your normal soap after using and rinsing off the CHG Soap.                9.  Pat yourself dry with a clean towel.            10.  Wear clean pajamas.            11.  Place clean sheets on your bed the night of your first shower and do not  sleep with pets. Day of Surgery : Do not apply any lotions/deodorants the morning of surgery.  Please wear clean clothes to the hospital/surgery center.  FAILURE TO FOLLOW THESE INSTRUCTIONS MAY RESULT IN THE CANCELLATION OF YOUR SURGERY  PATIENT SIGNATURE_________________________________  NURSE SIGNATURE__________________________________  ________________________________________________________________________

## 2022-11-28 NOTE — Progress Notes (Signed)
COVID Vaccine Completed:  Date of COVID positive in last 90 days:  PCP - Windle Guard, MD Cardiologist -   Chest x-ray -  EKG -  Stress Test -  ECHO -  Cardiac Cath -  Pacemaker/ICD device last checked: Spinal Cord Stimulator:  Bowel Prep -   Sleep Study -  CPAP -   Fasting Blood Sugar -  Checks Blood Sugar _____ times a day  Last dose of GLP1 agonist-  N/A GLP1 instructions:  N/A   Last dose of SGLT-2 inhibitors-  N/A SGLT-2 instructions: N/A   Blood Thinner Instructions: Aspirin Instructions: ASA 81 Last Dose:  Activity level:  Can go up a flight of stairs and perform activities of daily living without stopping and without symptoms of chest pain or shortness of breath.  Able to exercise without symptoms  Unable to go up a flight of stairs without symptoms of     Anesthesia review:   Patient denies shortness of breath, fever, cough and chest pain at PAT appointment  Patient verbalized understanding of instructions that were given to them at the PAT appointment. Patient was also instructed that they will need to review over the PAT instructions again at home before surgery.

## 2022-11-29 ENCOUNTER — Encounter (HOSPITAL_COMMUNITY): Payer: Self-pay

## 2022-11-29 ENCOUNTER — Encounter (HOSPITAL_COMMUNITY)
Admission: RE | Admit: 2022-11-29 | Discharge: 2022-11-29 | Disposition: A | Discharge status: Home / Self Care | Payer: Commercial Managed Care - PPO | Source: Ambulatory Visit | Attending: Surgery | Admitting: Surgery

## 2022-11-29 VITALS — BP 131/91 | Temp 98.3°F | Resp 14 | Ht 70.0 in | Wt 235.0 lb

## 2022-11-29 DIAGNOSIS — Z01818 Encounter for other preprocedural examination: Secondary | ICD-10-CM | POA: Insufficient documentation

## 2022-11-29 DIAGNOSIS — E119 Type 2 diabetes mellitus without complications: Secondary | ICD-10-CM | POA: Insufficient documentation

## 2022-11-29 LAB — CBC
HCT: 40.5 % (ref 39.0–52.0)
Hemoglobin: 14.8 g/dL (ref 13.0–17.0)
MCH: 30.6 pg (ref 26.0–34.0)
MCHC: 36.5 g/dL — ABNORMAL HIGH (ref 30.0–36.0)
MCV: 83.9 fL (ref 80.0–100.0)
Platelets: 325 10*3/uL (ref 150–400)
RBC: 4.83 MIL/uL (ref 4.22–5.81)
RDW: 13.6 % (ref 11.5–15.5)
WBC: 11 10*3/uL — ABNORMAL HIGH (ref 4.0–10.5)
nRBC: 0 % (ref 0.0–0.2)

## 2022-11-29 LAB — BASIC METABOLIC PANEL
Anion gap: 8 (ref 5–15)
BUN: 7 mg/dL (ref 6–20)
CO2: 24 mmol/L (ref 22–32)
Calcium: 9.5 mg/dL (ref 8.9–10.3)
Chloride: 107 mmol/L (ref 98–111)
Creatinine, Ser: 0.67 mg/dL (ref 0.61–1.24)
GFR, Estimated: >60 mL/min (ref >60)
Glucose, Bld: 133 mg/dL — ABNORMAL HIGH (ref 70–99)
Potassium: 4.2 mmol/L (ref 3.5–5.1)
Sodium: 139 mmol/L (ref 135–145)

## 2022-11-29 LAB — GLUCOSE, CAPILLARY: Glucose-Capillary: 175 mg/dL — ABNORMAL HIGH (ref 70–99)

## 2022-11-30 LAB — HEMOGLOBIN A1C
Hgb A1c MFr Bld: 6 % — ABNORMAL HIGH (ref 4.8–5.6)
Mean Plasma Glucose: 126 mg/dL

## 2022-12-20 ENCOUNTER — Ambulatory Visit: Payer: Commercial Managed Care - PPO | Admitting: Neurology

## 2022-12-22 ENCOUNTER — Ambulatory Visit (HOSPITAL_COMMUNITY): Payer: Commercial Managed Care - PPO | Admitting: Physician Assistant

## 2022-12-22 ENCOUNTER — Encounter (HOSPITAL_COMMUNITY): Payer: Self-pay | Admitting: Surgery

## 2022-12-22 ENCOUNTER — Encounter (HOSPITAL_COMMUNITY)
Admission: RE | Disposition: A | Discharge status: Home / Self Care | Payer: Self-pay | Source: Home / Self Care | Attending: Surgery

## 2022-12-22 ENCOUNTER — Ambulatory Visit (HOSPITAL_BASED_OUTPATIENT_CLINIC_OR_DEPARTMENT_OTHER): Payer: Commercial Managed Care - PPO | Admitting: Physician Assistant

## 2022-12-22 ENCOUNTER — Other Ambulatory Visit: Payer: Self-pay

## 2022-12-22 ENCOUNTER — Ambulatory Visit (HOSPITAL_COMMUNITY)
Admission: RE | Admit: 2022-12-22 | Discharge: 2022-12-22 | Disposition: A | Discharge status: Home / Self Care | Payer: Commercial Managed Care - PPO | Attending: Surgery | Admitting: Surgery

## 2022-12-22 DIAGNOSIS — Z6828 Body mass index (BMI) 28.0-28.9, adult: Secondary | ICD-10-CM | POA: Diagnosis not present

## 2022-12-22 DIAGNOSIS — E1144 Type 2 diabetes mellitus with diabetic amyotrophy: Secondary | ICD-10-CM | POA: Insufficient documentation

## 2022-12-22 DIAGNOSIS — E669 Obesity, unspecified: Secondary | ICD-10-CM | POA: Diagnosis not present

## 2022-12-22 DIAGNOSIS — R531 Weakness: Secondary | ICD-10-CM | POA: Insufficient documentation

## 2022-12-22 DIAGNOSIS — Z87891 Personal history of nicotine dependence: Secondary | ICD-10-CM | POA: Diagnosis not present

## 2022-12-22 DIAGNOSIS — M6281 Muscle weakness (generalized): Secondary | ICD-10-CM

## 2022-12-22 DIAGNOSIS — M6250 Muscle wasting and atrophy, not elsewhere classified, unspecified site: Secondary | ICD-10-CM | POA: Diagnosis not present

## 2022-12-22 DIAGNOSIS — Y92410 Unspecified street and highway as the place of occurrence of the external cause: Secondary | ICD-10-CM | POA: Diagnosis not present

## 2022-12-22 DIAGNOSIS — Z7984 Long term (current) use of oral hypoglycemic drugs: Secondary | ICD-10-CM | POA: Insufficient documentation

## 2022-12-22 DIAGNOSIS — E119 Type 2 diabetes mellitus without complications: Secondary | ICD-10-CM

## 2022-12-22 HISTORY — PX: MUSCLE BIOPSY: SHX716

## 2022-12-22 LAB — GLUCOSE, CAPILLARY
Glucose-Capillary: 149 mg/dL — ABNORMAL HIGH (ref 70–99)
Glucose-Capillary: 116 mg/dL — ABNORMAL HIGH (ref 70–99)

## 2022-12-22 SURGERY — MUSCLE BIOPSY
Anesthesia: General | Laterality: Left

## 2022-12-22 MED ORDER — BUPIVACAINE-EPINEPHRINE 0.5% -1:200000 IJ SOLN
INTRAMUSCULAR | Status: DC | PRN
  Administered 2022-12-22: 30 mL

## 2022-12-22 MED ORDER — BUPIVACAINE-EPINEPHRINE (PF) 0.5% -1:200000 IJ SOLN
INTRAMUSCULAR | Status: AC
  Filled 2022-12-22: qty 30

## 2022-12-22 MED ORDER — MIDAZOLAM HCL 5 MG/5ML IJ SOLN
INTRAMUSCULAR | Status: DC | PRN
  Administered 2022-12-22: 2 mg via INTRAVENOUS

## 2022-12-22 MED ORDER — LIDOCAINE 2% (20 MG/ML) 5 ML SYRINGE
INTRAMUSCULAR | Status: DC | PRN
  Administered 2022-12-22: 60 mg via INTRAVENOUS

## 2022-12-22 MED ORDER — HYDROMORPHONE HCL 1 MG/ML IJ SOLN: 0.2500 mg | INTRAMUSCULAR | Status: DC | PRN

## 2022-12-22 MED ORDER — ONDANSETRON HCL 4 MG/2ML IJ SOLN: 4.0000 mg | Freq: Once | INTRAMUSCULAR | Status: DC | PRN

## 2022-12-22 MED ORDER — LACTATED RINGERS IV SOLN: INTRAVENOUS | Status: DC

## 2022-12-22 MED ORDER — OXYCODONE HCL 5 MG PO TABS
5.0000 mg | ORAL_TABLET | Freq: Once | ORAL | Status: AC | PRN
  Administered 2022-12-22: 5 mg via ORAL

## 2022-12-22 MED ORDER — DEXAMETHASONE SODIUM PHOSPHATE 10 MG/ML IJ SOLN
INTRAMUSCULAR | Status: DC | PRN
  Administered 2022-12-22: 10 mg via INTRAVENOUS

## 2022-12-22 MED ORDER — OXYCODONE HCL 5 MG PO TABS
ORAL_TABLET | ORAL | Status: AC
  Filled 2022-12-22: qty 1

## 2022-12-22 MED ORDER — AMISULPRIDE (ANTIEMETIC) 5 MG/2ML IV SOLN: 10.0000 mg | Freq: Once | INTRAVENOUS | Status: DC | PRN

## 2022-12-22 MED ORDER — OXYCODONE HCL 5 MG/5ML PO SOLN: 5.0000 mg | Freq: Once | ORAL | Status: AC | PRN

## 2022-12-22 MED ORDER — CHLORHEXIDINE GLUCONATE 0.12 % MT SOLN
15.0000 mL | Freq: Once | OROMUCOSAL | Status: AC
  Administered 2022-12-22: 15 mL via OROMUCOSAL

## 2022-12-22 MED ORDER — CEFAZOLIN SODIUM-DEXTROSE 2-4 GM/100ML-% IV SOLN
2.0000 g | INTRAVENOUS | Status: AC
  Administered 2022-12-22: 2 g via INTRAVENOUS
  Filled 2022-12-22: qty 100

## 2022-12-22 MED ORDER — ACETAMINOPHEN 500 MG PO TABS: 1000.0000 mg | ORAL_TABLET | ORAL | Status: DC

## 2022-12-22 MED ORDER — MIDAZOLAM HCL 2 MG/2ML IJ SOLN
INTRAMUSCULAR | Status: AC
  Filled 2022-12-22: qty 2

## 2022-12-22 MED ORDER — FENTANYL CITRATE (PF) 100 MCG/2ML IJ SOLN
INTRAMUSCULAR | Status: DC | PRN
  Administered 2022-12-22: 50 ug via INTRAVENOUS

## 2022-12-22 MED ORDER — PROPOFOL 10 MG/ML IV BOLUS
INTRAVENOUS | Status: AC
  Filled 2022-12-22: qty 20

## 2022-12-22 MED ORDER — ORAL CARE MOUTH RINSE: 15.0000 mL | Freq: Once | OROMUCOSAL | Status: AC

## 2022-12-22 MED ORDER — CHLORHEXIDINE GLUCONATE 4 % EX LIQD: 60.0000 mL | Freq: Once | CUTANEOUS | Status: DC

## 2022-12-22 MED ORDER — PROPOFOL 10 MG/ML IV BOLUS
INTRAVENOUS | Status: DC | PRN
  Administered 2022-12-22: 200 mg via INTRAVENOUS

## 2022-12-22 MED ORDER — TRAMADOL HCL 50 MG PO TABS: 50.0000 mg | ORAL_TABLET | Freq: Four times a day (QID) | ORAL | 0 refills | Status: AC | PRN

## 2022-12-22 MED ORDER — FENTANYL CITRATE (PF) 100 MCG/2ML IJ SOLN
INTRAMUSCULAR | Status: AC
  Filled 2022-12-22: qty 2

## 2022-12-22 MED ORDER — ACETAMINOPHEN 500 MG PO TABS
1000.0000 mg | ORAL_TABLET | Freq: Once | ORAL | Status: DC
  Filled 2022-12-22: qty 2

## 2022-12-22 MED ORDER — ONDANSETRON HCL 4 MG/2ML IJ SOLN
INTRAMUSCULAR | Status: DC | PRN
  Administered 2022-12-22: 4 mg via INTRAVENOUS

## 2022-12-22 SURGICAL SUPPLY — 32 items
BAG COUNTER SPONGE SURGICOUNT (BAG) IMPLANT
BENZOIN TINCTURE PRP APPL 2/3 (GAUZE/BANDAGES/DRESSINGS) IMPLANT
BLADE SURG SZ10 CARB STEEL (BLADE) ×1 IMPLANT
BNDG GAUZE DERMACEA FLUFF 4 (GAUZE/BANDAGES/DRESSINGS) IMPLANT
CHLORAPREP W/TINT 26 (MISCELLANEOUS) ×1 IMPLANT
COVER SURGICAL LIGHT HANDLE (MISCELLANEOUS) ×1 IMPLANT
DRAIN PENROSE 0.25X18 (DRAIN) IMPLANT
DRAPE LAPAROTOMY TRNSV 102X78 (DRAPES) ×1 IMPLANT
DRSG TELFA 3X8 NADH STRL (GAUZE/BANDAGES/DRESSINGS) IMPLANT
ELECT REM PT RETURN 15FT ADLT (MISCELLANEOUS) ×1 IMPLANT
GAUZE 4X4 16PLY ~~LOC~~+RFID DBL (SPONGE) ×1 IMPLANT
GAUZE PAD ABD 8X10 STRL (GAUZE/BANDAGES/DRESSINGS) IMPLANT
GAUZE SPONGE 4X4 12PLY STRL (GAUZE/BANDAGES/DRESSINGS) IMPLANT
GLOVE BIO SURGEON STRL SZ 6 (GLOVE) ×1 IMPLANT
GLOVE INDICATOR 6.5 STRL GRN (GLOVE) ×1 IMPLANT
GLOVE SS BIOGEL STRL SZ 6 (GLOVE) ×1 IMPLANT
GOWN STRL REUS W/ TWL LRG LVL3 (GOWN DISPOSABLE) ×1 IMPLANT
GOWN STRL REUS W/ TWL XL LVL3 (GOWN DISPOSABLE) IMPLANT
GOWN STRL REUS W/TWL LRG LVL3 (GOWN DISPOSABLE) ×1
GOWN STRL REUS W/TWL XL LVL3 (GOWN DISPOSABLE)
KIT BASIN OR (CUSTOM PROCEDURE TRAY) ×1 IMPLANT
KIT TURNOVER KIT A (KITS) IMPLANT
MARKER SKIN DUAL TIP RULER LAB (MISCELLANEOUS) ×1 IMPLANT
NEEDLE HYPO 22GX1.5 SAFETY (NEEDLE) IMPLANT
PACK GENERAL/GYN (CUSTOM PROCEDURE TRAY) ×1 IMPLANT
SPIKE FLUID TRANSFER (MISCELLANEOUS) ×1 IMPLANT
STRIP CLOSURE SKIN 1/2X4 (GAUZE/BANDAGES/DRESSINGS) IMPLANT
SUT MNCRL AB 4-0 PS2 18 (SUTURE) ×1 IMPLANT
SUT VIC AB 3-0 SH 18 (SUTURE) IMPLANT
SYR CONTROL 10ML LL (SYRINGE) IMPLANT
TOWEL OR 17X26 10 PK STRL BLUE (TOWEL DISPOSABLE) ×1 IMPLANT
TOWEL OR NON WOVEN STRL DISP B (DISPOSABLE) ×1 IMPLANT

## 2022-12-22 NOTE — Anesthesia Preprocedure Evaluation (Addendum)
Anesthesia Evaluation  Patient identified by MRN, date of birth, ID band Patient awake    Reviewed: Allergy & Precautions, NPO status , Patient's Chart, lab work & pertinent test results  History of Anesthesia Complications (+) PROLONGED EMERGENCE and history of anesthetic complications (remote hx prolonged emergence after hernia surgery)  Airway Mallampati: III  TM Distance: >3 FB Neck ROM: Full    Dental  (+) Teeth Intact, Dental Advisory Given   Pulmonary former smoker Denies snoring    Pulmonary exam normal breath sounds clear to auscultation       Cardiovascular negative cardio ROS Normal cardiovascular exam Rhythm:Regular Rate:Normal     Neuro/Psych negative neurological ROS  negative psych ROS   GI/Hepatic negative GI ROS, Neg liver ROS,,,  Endo/Other  diabetes, Well Controlled, Type 2, Oral Hypoglycemic Agents    Renal/GU negative Renal ROS  negative genitourinary   Musculoskeletal negative musculoskeletal ROS (+)    Abdominal  (+) + obese  Peds  Hematology negative hematology ROS (+)   Anesthesia Other Findings Progressive LLE weakness since LLE injury in 2017  Reproductive/Obstetrics negative OB ROS                              Anesthesia Physical Anesthesia Plan  ASA: 3  Anesthesia Plan: General   Post-op Pain Management: Tylenol PO (pre-op)*   Induction: Intravenous  PONV Risk Score and Plan: 2 and Ondansetron, Dexamethasone, Midazolam and Treatment may vary due to age or medical condition  Airway Management Planned: LMA  Additional Equipment: None  Intra-op Plan:   Post-operative Plan: Extubation in OR  Informed Consent: I have reviewed the patients History and Physical, chart, labs and discussed the procedure including the risks, benefits and alternatives for the proposed anesthesia with the patient or authorized representative who has indicated his/her  understanding and acceptance.     Dental advisory given  Plan Discussed with: CRNA  Anesthesia Plan Comments:          Anesthesia Quick Evaluation

## 2022-12-22 NOTE — Transfer of Care (Signed)
Immediate Anesthesia Transfer of Care Note  Patient: [name] [name]  Procedure(s) Performed: LEFT QUADRICEPS MUSCLE BIOPSY (Left)  Patient Location: PACU  Anesthesia Type:General  Level of Consciousness: sedated, patient cooperative, and responds to stimulation  Airway & Oxygen Therapy: Patient Spontanous Breathing and Patient connected to face mask oxygen  Post-op Assessment: Report given to RN and Post -op Vital signs reviewed and stable  Post vital signs: Reviewed and stable  Last Vitals:  Vitals Value Taken Time  BP 129/85 12/22/22 1413  Temp    Pulse 92 12/22/22 1415  Resp 20 12/22/22 1415  SpO2 100 % 12/22/22 1415  Vitals shown include unvalidated device data.  Last Pain:  Vitals:   12/22/22 1115  TempSrc: Oral         Complications: No notable events documented.

## 2022-12-22 NOTE — Op Note (Signed)
Operative Note  Pastor Sgro  [id number]  [id number]  12/22/2022   Surgeon: Romana Juniper MD FACS   Procedure performed: left vastus lateralis muscle biopsy   Preop diagnosis: weakness Post-op diagnosis/intraop findings: same   Specimens: muscle biopsy Retained items: no  EBL: minimal cc Complications: none   Description of procedure: After obtaining informed consent the patient was taken to the operating room and placed supine on operating room table where general LMA anesthesia was initiated, preoperative antibiotics were administered, SCDs applied, and a formal timeout was performed.  The left thigh was prepped and draped in usual sterile fashion.  After infiltration with local (half percent Marcaine with epinephrine) a vertical incision was made over the lateral aspect of the quadriceps muscle.  The soft tissues were dissected with cautery.  The fascia was divided and the muscle tissue was cleared off.  A 2 cm x 1 cm section of this was isolated with hemostats and excised sharply.  This was handed off for pathology.  The divided muscle tissue was ligated with 3-0 Vicryl ties.  Hemostasis was ensured and the wound.  The fascia was closed with interrupted 3-0 Vicryl's.  The skin incision was closed with running subcuticular 4-0 Monocryl.  Benzoin, Steri-Strips and a dry dressing were applied.  The patient was then awakened, extubated and taken to PACU in stable condition.    All counts were correct at the completion of the case.

## 2022-12-22 NOTE — Anesthesia Procedure Notes (Signed)
Procedure Name: LMA Insertion Date/Time: 12/22/2022 1:21 PM  Performed by: Gean Maidens, CRNAPre-anesthesia Checklist: Patient identified, Emergency Drugs available, Suction available, Patient being monitored and Timeout performed Patient Re-evaluated:Patient Re-evaluated prior to induction Oxygen Delivery Method: Circle system utilized Preoxygenation: Pre-oxygenation with 100% oxygen Induction Type: IV induction Ventilation: Mask ventilation without difficulty LMA: LMA inserted LMA Size: 4.0 Number of attempts: 1 Placement Confirmation: positive ETCO2 and breath sounds checked- equal and bilateral Tube secured with: Tape Dental Injury: Teeth and Oropharynx as per pre-operative assessment

## 2022-12-22 NOTE — Discharge Instructions (Signed)
GENERAL SURGERY: POST OP INSTRUCTIONS   DIET: Follow a light bland diet & liquids the first 24 hours after arrival home, such as soup, liquids, starches, etc.  Be sure to drink plenty of fluids.  Quickly advance to a usual solid diet within a few days.  Avoid fast food or heavy meals as your are more likely to get nauseated or have irregular bowels.  A low-sugar, high-fiber diet for the rest of your life is ideal.   Take your usually prescribed home medications unless otherwise directed. PAIN CONTROL: Pain is best controlled by a usual combination of three different methods TOGETHER: Ice/Heat Over the counter pain medication Prescription pain medication Most patients will experience some swelling and bruising around the incisions.  Ice packs or heating pads (30-60 minutes up to 6 times a day) will help. Use ice for the first few days to help decrease swelling and bruising, then switch to heat to help relax tight/sore spots and speed recovery.  Some people prefer to use ice alone, heat alone, alternating between ice & heat.  Experiment to what works for you.  Swelling and bruising can take several weeks to resolve.   It is helpful to take an over-the-counter pain medication regularly for the first few weeks.  Choose one of the following that works best for you: Naproxen (Aleve, etc)  Two 220mg  tabs twice a day OR Ibuprofen (Advil, etc) Three 200mg  tabs four times a day (every meal & bedtime) AND Acetaminophen (Tylenol, etc) 500-650mg  four times a day (every meal & bedtime) A  prescription for pain medication (such as oxycodone, hydrocodone, etc) should be given to you upon discharge.  Take your pain medication as prescribed.  If you are having problems/concerns with the prescription medicine (does not control pain, nausea, vomiting, rash, itching, etc), please call us [phone] to see if we need to switch you to a different pain medicine that will work better for you and/or control your side  effect better. If you need a refill on your pain medication, please contact your pharmacy.  They will contact our office to request authorization. Prescriptions will not be filled after 5 pm or on week-ends. Avoid getting constipated.  Between the surgery and the pain medications, it is common to experience some constipation.  Increasing fluid intake and taking a fiber supplement (such as Metamucil, Citrucel, FiberCon, MiraLax, etc) 1-2 times a day regularly will usually help prevent this problem from occurring.  A mild laxative (prune juice, Milk of Magnesia, MiraLax, etc) should be taken according to package directions if there are no bowel movements after 48 hours.   Wash / shower every day, starting 2 days after surgery.  You may shower over the steri strips as they are waterproof.  Continue to shower over incision(s) after the dressing is off. Remove your outer bandage 2 days after surgery; steri strips will peel off after 1-2 weeks.  You may leave the incision open to air.  You may have skin tapes (Steri Strips) covering the incision(s).  Leave them on until one week, then remove.  You may replace a dressing/Band-Aid to cover the incision for comfort if you wish.      ACTIVITIES as tolerated:   You may resume regular (light) daily activities beginning the next day--such as daily self-care, walking, climbing stairs--gradually increasing activities as tolerated.  If you can walk 30 minutes without difficulty, it is safe to try more intense activity such as jogging, treadmill, bicycling, low-impact aerobics, swimming, etc. Save  the most intensive and strenuous activity for last such as sit-ups, heavy lifting, contact sports, etc  Refrain from any heavy lifting or straining until you are off narcotics for pain control.   DO NOT PUSH THROUGH PAIN.  Let pain be your guide: If it hurts to do something, don't do it.  Pain is your body warning you to avoid that activity for another week until the pain goes  down. You may drive when you are no longer taking prescription pain medication, you can comfortably wear a seatbelt, and you can safely maneuver your car and apply brakes. You may have sexual intercourse when it is comfortable.  FOLLOW UP in our office Please call [facility] at [phone] to set up an appointment to see your surgeon in the office for a follow-up appointment approximately 2-3 weeks after your surgery. Make sure that you call for this appointment the day you arrive home to insure a convenient appointment time. 9. IF YOU HAVE DISABILITY OR FAMILY LEAVE FORMS, BRING THEM TO THE OFFICE FOR PROCESSING.  DO NOT GIVE THEM TO YOUR DOCTOR.   WHEN TO CALL us [phone]: Poor pain control Reactions / problems with new medications (rash/itching, nausea, etc)  Fever over 101.5 F (38.5 C) Worsening swelling or bruising Continued bleeding from incision. Increased pain, redness, or drainage from the incision Difficulty breathing / swallowing   The clinic staff is available to answer your questions during regular business hours (8:30am-5pm).  Please don't hesitate to call and ask to speak to one of our nurses for clinical concerns.   If you have a medical emergency, go to the nearest emergency room or call 911.  A surgeon from [facility] is always on call at the [facility], [place], [place], [place], [place]  [id number] ? MAIN: [phone] ? TOLL FREE: [phone] ?  FAX [phone] [email or web address]
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
## 2022-12-23 NOTE — Anesthesia Postprocedure Evaluation (Addendum)
Anesthesia Post Note  Patient: Jalene Lacko  Procedure(s) Performed: LEFT QUADRICEPS MUSCLE BIOPSY (Left)     Patient location during evaluation: PACU Anesthesia Type: General Level of consciousness: awake and alert, oriented and patient cooperative Pain management: pain level controlled Vital Signs Assessment: post-procedure vital signs reviewed and stable Respiratory status: spontaneous breathing, nonlabored ventilation and respiratory function stable Cardiovascular status: blood pressure returned to baseline and stable Postop Assessment: no apparent nausea or vomiting Anesthetic complications: no   No notable events documented.  Last Vitals:  Vitals:   12/22/22 1445 12/22/22 1500  BP: (!) 149/101 (!) 128/90  Pulse: 89 87  Resp: 16 11  Temp:    SpO2: 98% 100%    Last Pain:  Vitals:   12/22/22 1509  TempSrc:   PainSc: 5    Pain Goal:                   Pervis Hocking

## 2022-12-24 ENCOUNTER — Encounter (HOSPITAL_COMMUNITY): Payer: Self-pay | Admitting: Surgery

## 2023-01-10 ENCOUNTER — Encounter (HOSPITAL_COMMUNITY): Payer: Self-pay | Admitting: Surgery

## 2023-01-10 LAB — SURGICAL PATHOLOGY

## 2023-01-17 ENCOUNTER — Ambulatory Visit (INDEPENDENT_AMBULATORY_CARE_PROVIDER_SITE_OTHER): Payer: Commercial Managed Care - PPO | Admitting: Neurology

## 2023-01-17 ENCOUNTER — Encounter: Payer: Self-pay | Admitting: Neurology

## 2023-01-17 ENCOUNTER — Telehealth: Payer: Self-pay | Admitting: Neurology

## 2023-01-17 VITALS — BP 117/81 | HR 110 | Ht 70.0 in | Wt 198.0 lb

## 2023-01-17 DIAGNOSIS — S81002A Unspecified open wound, left knee, initial encounter: Secondary | ICD-10-CM

## 2023-01-17 DIAGNOSIS — L24A9 Irritant contact dermatitis due friction or contact with other specified body fluids: Secondary | ICD-10-CM | POA: Diagnosis not present

## 2023-01-17 DIAGNOSIS — S81802A Unspecified open wound, left lower leg, initial encounter: Secondary | ICD-10-CM

## 2023-01-17 DIAGNOSIS — E1144 Type 2 diabetes mellitus with diabetic amyotrophy: Secondary | ICD-10-CM

## 2023-01-17 DIAGNOSIS — S91002A Unspecified open wound, left ankle, initial encounter: Secondary | ICD-10-CM

## 2023-01-17 DIAGNOSIS — R29898 Other symptoms and signs involving the musculoskeletal system: Secondary | ICD-10-CM | POA: Diagnosis not present

## 2023-01-17 MED ORDER — GABAPENTIN 300 MG PO CAPS: 300.0000 mg | ORAL_CAPSULE | Freq: Three times a day (TID) | ORAL | 11 refills | Status: DC

## 2023-01-17 NOTE — Patient Instructions (Addendum)
PT next door  [facility] management Center - referred Start Gabapentin for pain - ex a day, can increase Wound management for open draining wounds on left lower extremities - watch for infection Needs to see primary care Dr. Seth Bake at [facility] if looks like infection Blood work Disability paperwork  Meds ordered this encounter  Medications   gabapentin (NEURONTIN) 300 MG capsule    Sig: Take 1 capsule (300 mg total) by mouth 3 (three) times daily.    Dispense:  90 capsule    Refill:  11   Orders Placed This Encounter  Procedures   CBC with Differential/Platelets   Comprehensive metabolic panel   AMB referral to wound care center   Ambulatory referral to Pain Clinic   Ambulatory referral to Physical Therapy     Cellulitis, Adult  Cellulitis is a skin infection. The infected area is usually warm, red, swollen, and tender. This condition occurs most often in the arms and lower legs. The infection can travel to the muscles, blood, and underlying tissue and become serious. It is very important to get treated for this condition. What are the causes? Cellulitis is caused by bacteria. The bacteria enter through a break in the skin, such as a cut, burn, insect bite, open sore, or crack. What increases the risk? This condition is more likely to occur in people who: Have a weak body defense system (immune system). Have open wounds on the skin, such as cuts, burns, bites, and scrapes. Bacteria can enter the body through these open wounds. Are older than 60 years of age. Have diabetes. Have a type of long-lasting (chronic) liver disease (cirrhosis) or kidney disease. Are obese. Have a skin condition such as: Itchy rash (eczema). Slow movement of blood in the veins (venous stasis). Fluid buildup below the skin (edema). Have had radiation therapy. Use IV drugs. What are the signs or symptoms? Symptoms of this condition include: Redness, streaking, or spotting on the skin. Swollen  area of the skin. Tenderness or pain when an area of the skin is touched. Warm skin. A fever. Chills. Blisters. How is this diagnosed? This condition is diagnosed based on a medical history and physical exam. You may also have tests, including: Blood tests. Imaging tests. How is this treated? Treatment for this condition may include: Medicines, such as antibiotic medicines or medicines to treat allergies (antihistamines). Supportive care, such as rest and application of cold or warm cloths (compresses) to the skin. Hospital care, if the condition is severe. The infection usually starts to get better within 1-2 days of treatment. Follow these instructions at home:  Medicines Take over-the-counter and prescription medicines only as told by your health care provider. If you were prescribed an antibiotic medicine, take it as told by your health care provider. Do not stop taking the antibiotic even if you start to feel better. General instructions Drink enough fluid to keep your urine pale yellow. Do not touch or rub the infected area. Raise (elevate) the infected area above the level of your heart while you are sitting or lying down. Apply warm or cold compresses to the affected area as told by your health care provider. Keep all follow-up visits as told by your health care provider. This is important. These visits let your health care provider make sure a more serious infection is not developing. Contact a health care provider if: You have a fever. Your symptoms do not begin to improve within 1-2 days of starting treatment. Your bone or joint  underneath the infected area becomes painful after the skin has healed. Your infection returns in the same area or another area. You notice a swollen bump in the infected area. You develop new symptoms. You have a general ill feeling (malaise) with muscle aches and pains. Get help right away if: Your symptoms get worse. You feel very  sleepy. You develop vomiting or diarrhea that persists. You notice red streaks coming from the infected area. Your red area gets larger or turns dark in color. These symptoms may represent a serious problem that is an emergency. Do not wait to see if the symptoms will go away. Get medical help right away. Call your local emergency services (911 in the [place] Do not drive yourself to the hospital. Summary Cellulitis is a skin infection. This condition occurs most often in the arms and lower legs. Treatment for this condition may include medicines, such as antibiotic medicines or antihistamines. Take over-the-counter and prescription medicines only as told by your health care provider. If you were prescribed an antibiotic medicine, do not stop taking the antibiotic even if you start to feel better. Contact a health care provider if your symptoms do not begin to improve within 1-2 days of starting treatment or your symptoms get worse. Keep all follow-up visits as told by your health care provider. This is important. These visits let your health care provider make sure that a more serious infection is not developing. This information is not intended to replace advice given to you by your health care provider. Make sure you discuss any questions you have with your health care provider. Document Revised: 09/07/2021 Document Reviewed: 09/08/2021 Elsevier Patient Education  2023 [employer]  Gabapentin Capsules or Tablets What is this medication? GABAPENTIN (GA ba pen tin) treats nerve pain. It may also be used to prevent and control seizures in people with epilepsy. It works by calming overactive nerves in your body. This medicine may be used for other purposes; ask your health care provider or pharmacist if you have questions. COMMON BRAND NAME(S): Active-PAC with Gabapentin, Ascencion Dike, Gralise, Neurontin What should I tell my care team before I take this medication? They need to know if you have  any of these conditions: Alcohol or substance use disorder Kidney disease Lung or breathing disease Suicidal thoughts, plans, or attempt; a previous suicide attempt by you or a family member An unusual or allergic reaction to gabapentin, other medications, foods, dyes, or preservatives Pregnant or trying to get pregnant Breast-feeding How should I use this medication? Take this medication by mouth with a glass of water. Follow the directions on the prescription label. You can take it with or without food. If it upsets your stomach, take it with food. Take your medication at regular intervals. Do not take it more often than directed. Do not stop taking except on your care team's advice. If you are directed to break the 600 or 800 mg tablets in half as part of your dose, the extra half tablet should be used for the next dose. If you have not used the extra half tablet within 28 days, it should be thrown away. A special MedGuide will be given to you by the pharmacist with each prescription and refill. Be sure to read this information carefully each time. Talk to your care team about the use of this medication in children. While this medication may be prescribed for children as young as 3 years for selected conditions, precautions do apply. Overdosage: If you think you  have taken too much of this medicine contact a poison control center or emergency room at once. NOTE: This medicine is only for you. Do not share this medicine with others. What if I miss a dose? If you miss a dose, take it as soon as you can. If it is almost time for your next dose, take only that dose. Do not take double or extra doses. What may interact with this medication? Alcohol Antihistamines for allergy, cough, and cold Certain medications for anxiety or sleep Certain medications for depression like amitriptyline, fluoxetine, sertraline Certain medications for seizures like phenobarbital, primidone Certain medications for  stomach problems General anesthetics like halothane, isoflurane, methoxyflurane, propofol Local anesthetics like lidocaine, pramoxine, tetracaine Medications that relax muscles for surgery Opioid medications for pain Phenothiazines like chlorpromazine, mesoridazine, prochlorperazine, thioridazine This list may not describe all possible interactions. Give your health care provider a list of all the medicines, herbs, non-prescription drugs, or dietary supplements you use. Also tell them if you smoke, drink alcohol, or use illegal drugs. Some items may interact with your medicine. What should I watch for while using this medication? Visit your care team for regular checks on your progress. You may want to keep a record at home of how you feel your condition is responding to treatment. You may want to share this information with your care team at each visit. You should contact your care team if your seizures get worse or if you have any new types of seizures. Do not stop taking this medication or any of your seizure medications unless instructed by your care team. Stopping your medication suddenly can increase your seizures or their severity. This medication may cause serious skin reactions. They can happen weeks to months after starting the medication. Contact your care team right away if you notice fevers or flu-like symptoms with a rash. The rash may be red or purple and then turn into blisters or peeling of the skin. Or, you might notice a red rash with swelling of the face, lips or lymph nodes in your neck or under your arms. Wear a medical identification bracelet or chain if you are taking this medication for seizures. Carry a card that lists all your medications. This medication may affect your coordination, reaction time, or judgment. Do not drive or operate machinery until you know how this medication affects you. Sit up or stand slowly to reduce the risk of dizzy or fainting spells. Drinking alcohol  with this medication can increase the risk of these side effects. Your mouth may get dry. Chewing sugarless gum or sucking hard candy, and drinking plenty of water may help. Watch for new or worsening thoughts of suicide or depression. This includes sudden changes in mood, behaviors, or thoughts. These changes can happen at any time but are more common in the beginning of treatment or after a change in dose. Call your care team right away if you experience these thoughts or worsening depression. If you become pregnant while using this medication, you may enroll in the [place] Pregnancy Registry by calling [phone]. This registry collects information about the safety of antiepileptic medication use during pregnancy. What side effects may I notice from receiving this medication? Side effects that you should report to your care team as soon as possible: Allergic reactions or angioedema--skin rash, itching, hives, swelling of the face, eyes, lips, tongue, arms, or legs, trouble swallowing or breathing Rash, fever, and swollen lymph nodes Thoughts of suicide or self harm, worsening  mood, feelings of depression Trouble breathing Unusual changes in mood or behavior in children after use such as difficulty concentrating, hostility, or restlessness Side effects that usually do not require medical attention (report to your care team if they continue or are bothersome): Dizziness Drowsiness Nausea Swelling of ankles, feet, or hands Vomiting This list may not describe all possible side effects. Call your doctor for medical advice about side effects. You may report side effects to FDA at 1-800-FDA-1088. Where should I keep my medication? Keep out of reach of children and pets. Store at room temperature between 15 and 30 degrees C (59 and 86 degrees F). Get rid of any unused medication after the expiration date. This medication may cause accidental overdose and death if taken  by other adults, children, or pets. To get rid of medications that are no longer needed or have expired: Take the medication to a medication take-back program. Check with your pharmacy or law enforcement to find a location. If you cannot return the medication, check the label or package insert to see if the medication should be thrown out in the garbage or flushed down the toilet. If you are not sure, ask your care team. If it is safe to put it in the trash, empty the medication out of the container. Mix the medication with cat litter, dirt, coffee grounds, or other unwanted substance. Seal the mixture in a bag or container. Put it in the trash. NOTE: This sheet is a summary. It may not cover all possible information. If you have questions about this medicine, talk to your doctor, pharmacist, or health care provider.  2023 Elsevier/Gold Standard (2021-05-30 00:00:00)  Diabetic Neuropathy Diabetic neuropathy refers to nerve damage that is caused by diabetes. Over time, people with diabetes can develop nerve damage throughout the body. There are several types of diabetic neuropathy: Peripheral neuropathy. This is the most common type of diabetic neuropathy. It damages the nerves that carry signals between the spinal cord and other parts of the body (peripheral nerves). This usually affects nerves in the feet, legs, hands, and arms. Autonomic neuropathy. This type causes damage to nerves that control involuntary functions (autonomic nerves). Involuntary functions are functions of the body that you do not control. They include heartbeat, body temperature, blood pressure, urination, digestion, sweating, sexual function, or response to changes in blood glucose. Focal neuropathy. This type of nerve damage affects one area of the body, such as an arm, a leg, or the face. The injury may involve one nerve or a small group of nerves. Focal neuropathy can be painful and unpredictable. It occurs most often in older  adults with diabetes. This often develops suddenly, but usually improves over time and does not cause long-term problems. Proximal neuropathy. This type of nerve damage affects the nerves of the thighs, hips, buttocks, or legs. It causes severe pain, weakness, and muscle death (atrophy), usually in the thigh muscles. It is more common among older men and people who have type 2 diabetes. The length of recovery time may vary. What are the causes? Peripheral, autonomic, and focal neuropathies are caused by diabetes that is not well controlled with treatment. The cause of proximal neuropathy is not known, but it may be caused by inflammation related to uncontrolled blood glucose levels. What are the signs or symptoms? Peripheral neuropathy Peripheral neuropathy develops slowly over time. When the nerves of the feet and legs no longer work, you may experience: Burning, stabbing, or aching pain in the legs or feet.  Pain or cramping in the legs or feet. Loss of feeling (numbness) and inability to feel pressure or pain in the feet. This can lead to: Thick calluses or sores on areas of constant pressure. Ulcers. Reduced ability to feel temperature changes. Foot deformities. Muscle weakness. Loss of balance or coordination. Autonomic neuropathy The symptoms of autonomic neuropathy vary depending on which nerves are affected. Symptoms may include: Problems with digestion, such as: Nausea or vomiting. Poor appetite. Bloating. Diarrhea or constipation. Trouble swallowing. Losing weight without trying to. Problems with the heart, blood, and lungs, such as: Dizziness, especially when standing up. Fainting. Shortness of breath. Irregular heartbeat. Bladder problems, such as: Trouble starting or stopping urination. Leaking urine. Trouble emptying the bladder. Urinary tract infections (UTIs). Problems with other body functions, such as: Sweat. You may sweat too much or too little. Temperature. You  might get hot easily. Or, you might feel cold more than usual. Sexual function. Men may not be able to get or maintain an erection. Women may have vaginal dryness and difficulty with arousal. Focal neuropathy Symptoms affect only one area of the body. Common symptoms include: Numbness. Tingling. Burning pain. Prickling feeling. Very sensitive skin. Weakness. Inability to move (paralysis). Muscle twitching. Muscles getting smaller (wasting). Poor coordination. Double or blurred vision. Proximal neuropathy Sudden, severe pain in the hip, thigh, or buttocks. Pain may spread from the back into the legs (sciatica). Pain and numbness in the arms and legs. Tingling. Loss of bladder control or bowel control. Weakness and wasting of thigh muscles. Difficulty getting up from a seated position. Abdominal swelling. Unexplained weight loss. How is this diagnosed? Diagnosis varies depending on the type of neuropathy your health care provider suspects. Peripheral neuropathy Your health care provider will do a neurologic exam. This exam checks your reflexes, how you move, and what you can feel. You may have other tests, such as: Blood tests. Tests of the fluid that surrounds the spinal cord (lumbar puncture). CT scan. MRI. Checking the nerves that control muscles (electromyogram, or EMG). Checking how quickly signals pass through your nerves (nerve conduction study). Checking a small piece of a nerve using a microscope (biopsy). Autonomic neuropathy You may have tests, such as: Tests to measure your blood pressure and heart rate. You may be secured to an exam table that moves you from a lying position to an upright position (table tilt test). Breathing tests to check your lungs. Tests to check how food moves through the digestive system (gastric emptying tests). Blood, sweat, or urine tests. Ultrasound of your bladder. Spinal fluid tests. Focal neuropathy This condition may be diagnosed  with: A neurologic exam. CT scan. MRI. EMG. Nerve conduction study. Proximal neuropathy There is no test to diagnose this type of neuropathy. You may have tests to rule out other possible causes of this type of neuropathy. Tests may include: X-rays of your spine and lumbar region. Lumbar puncture. MRI. How is this treated? The goal of treatment is to keep nerve damage from getting worse. Treatment may include: Following your diabetes management plan. This will help keep your blood glucose level and your A1C level within your target range. This is the most important treatment. Using prescription pain medicine. Follow these instructions at home: Diabetes management Follow your diabetes management plan as told by your health care provider. Check your blood glucose levels. Keep your blood glucose in your target range. Have your A1C level checked at least two times a year, or as often as told. Take over the  counter and prescription medicines only as told by your health care provider. This includes insulin and diabetes medicine.  Lifestyle  Do not use any products that contain nicotine or tobacco, such as cigarettes, e-cigarettes, and chewing tobacco. If you need help quitting, ask your health care provider. Be physically active every day. Include strength training and balance exercises. Follow a healthy meal plan. Work with your health care provider to manage your blood pressure. General instructions Ask your health care provider if the medicine prescribed to you requires you to avoid driving or using machinery. Check your skin and feet every day for cuts, bruises, redness, blisters, or sores. Keep all follow-up visits. This is important. Contact a health care provider if: You have burning, stabbing, or aching pain in your legs or feet. You are unable to feel pressure or pain in your feet. You develop problems with digestion, such  as: Nausea. Vomiting. Bloating. Constipation. Diarrhea. Abdominal pain. You have difficulty with urination, such as: Inability to control when you urinate (incontinence). Inability to completely empty the bladder (retention). You feel as if your heart is racing (palpitations). You feel dizzy, weak, or faint when you stand up. Get help right away if: You cannot urinate. You have sudden weakness or loss of coordination. You have trouble speaking. You have pain or pressure in your chest. You have an irregular heartbeat. You have sudden inability to move a part of your body. These symptoms may represent a serious problem that is an emergency. Do not wait to see if the symptoms will go away. Get medical help right away. Call your local emergency services (911 in the [place] Do not drive yourself to the hospital. Summary Diabetic neuropathy is nerve damage that is caused by diabetes. It can cause numbness and pain in the arms, legs, digestive tract, heart, and other body systems. This condition is treated by keeping your blood glucose level and your A1C level within your target range. This can help prevent neuropathy from getting worse. Check your skin and feet every day for cuts, bruises, redness, blisters, or sores. Do not use any products that contain nicotine or tobacco, such as cigarettes, e-cigarettes, and chewing tobacco. This information is not intended to replace advice given to you by your health care provider. Make sure you discuss any questions you have with your health care provider. Document Revised: 04/08/2020 Document Reviewed: 04/08/2020 Elsevier Patient Education  2023 [employer].

## 2023-01-17 NOTE — Progress Notes (Signed)
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 options, counseling and coordination of care, risks and benefits of management, compliance, or risk factor reduction

## 2023-01-17 NOTE — Telephone Encounter (Signed)
Patient needs disability paperwork filled out asap. He said it was faxed to [place]. Did we get anything? If not call his mother, she is now the phone number on his chart(patient stated he does not pick up phone calls or listen to messages due to spam calls) so his mother is taking over. thanks

## 2023-01-18 ENCOUNTER — Encounter: Payer: Self-pay | Admitting: *Deleted

## 2023-01-18 ENCOUNTER — Telehealth: Payer: Self-pay | Admitting: *Deleted

## 2023-01-18 LAB — CBC WITH DIFFERENTIAL/PLATELET
Basophils Absolute: 0.1 10*3/uL (ref 0.0–0.2)
Basos: 1 %
EOS (ABSOLUTE): 0.2 10*3/uL (ref 0.0–0.4)
Eos: 2 %
Hematocrit: 41.9 % (ref 37.5–51.0)
Hemoglobin: 14.7 g/dL (ref 13.0–17.7)
Immature Grans (Abs): 0 10*3/uL (ref 0.0–0.1)
Immature Granulocytes: 0 %
Lymphocytes Absolute: 4.3 10*3/uL — ABNORMAL HIGH (ref 0.7–3.1)
Lymphs: 38 %
MCH: 30.3 pg (ref 26.6–33.0)
MCHC: 35.1 g/dL (ref 31.5–35.7)
MCV: 86 fL (ref 79–97)
Monocytes Absolute: 1.1 10*3/uL — ABNORMAL HIGH (ref 0.1–0.9)
Monocytes: 10 %
Neutrophils Absolute: 5.7 10*3/uL (ref 1.4–7.0)
Neutrophils: 49 %
Platelets: 317 10*3/uL (ref 150–450)
RBC: 4.85 x10E6/uL (ref 4.14–5.80)
RDW: 14.2 % (ref 11.6–15.4)
WBC: 11.4 10*3/uL — ABNORMAL HIGH (ref 3.4–10.8)

## 2023-01-18 LAB — COMPREHENSIVE METABOLIC PANEL
ALT: 10 IU/L (ref 0–44)
AST: 8 IU/L (ref 0–40)
Albumin/Globulin Ratio: 1.6 (ref 1.2–2.2)
Albumin: 4.1 g/dL (ref 3.8–4.9)
Alkaline Phosphatase: 52 IU/L (ref 44–121)
BUN/Creatinine Ratio: 10 (ref 9–20)
BUN: 6 mg/dL (ref 6–24)
Bilirubin Total: 0.5 mg/dL (ref 0.0–1.2)
CO2: 23 mmol/L (ref 20–29)
Calcium: 9.3 mg/dL (ref 8.7–10.2)
Chloride: 103 mmol/L (ref 96–106)
Creatinine, Ser: 0.61 mg/dL — ABNORMAL LOW (ref 0.76–1.27)
Globulin, Total: 2.5 g/dL (ref 1.5–4.5)
Glucose: 126 mg/dL — ABNORMAL HIGH (ref 70–99)
Potassium: 4.3 mmol/L (ref 3.5–5.2)
Sodium: 140 mmol/L (ref 134–144)
Total Protein: 6.6 g/dL (ref 6.0–8.5)
eGFR: 111 mL/min/{1.73_m2} (ref >59)

## 2023-01-18 NOTE — Telephone Encounter (Signed)
Spoke with mother (checked DPR) Gave patients labwork resuts and gave Dr. Ferdinand Lango recommendation to f/u with Dr. Seth Bake . Mother states will make patient aware and thanked me for calling .

## 2023-01-18 NOTE — Telephone Encounter (Signed)
Form for Disability questions answered , to Dr. Jaynee Eagles to complete and then sign.

## 2023-01-18 NOTE — Telephone Encounter (Signed)
Error

## 2023-01-18 NOTE — Telephone Encounter (Signed)
-----   Message from Melvenia Beam, MD sent at 01/18/2023  2:46 PM EST ----- White blood cells are still elevated but not moreso than last time, follow up with Dr. Seth Bake thanks

## 2023-01-18 NOTE — Telephone Encounter (Signed)
Disability form faxed to our office . Sent form to front desk to see if patient has paid processing fee.

## 2023-01-18 NOTE — Telephone Encounter (Signed)
Pt would like to be notified when disability paperwork has been faxed.

## 2023-01-18 NOTE — Telephone Encounter (Signed)
Received form

## 2023-01-18 NOTE — Telephone Encounter (Signed)
-----   Message from Antonia B Ahern, MD sent at 01/18/2023  2:46 PM EST ----- White blood cells are still elevated but not moreso than last time, follow up with Dr. Andrea thanks 

## 2023-01-22 ENCOUNTER — Ambulatory Visit: Payer: Commercial Managed Care - PPO | Attending: Neurology | Admitting: Physical Therapy

## 2023-01-22 DIAGNOSIS — R29898 Other symptoms and signs involving the musculoskeletal system: Secondary | ICD-10-CM | POA: Diagnosis not present

## 2023-01-22 DIAGNOSIS — R2689 Other abnormalities of gait and mobility: Secondary | ICD-10-CM | POA: Diagnosis present

## 2023-01-22 DIAGNOSIS — M6281 Muscle weakness (generalized): Secondary | ICD-10-CM | POA: Diagnosis present

## 2023-01-22 DIAGNOSIS — R2681 Unsteadiness on feet: Secondary | ICD-10-CM | POA: Insufficient documentation

## 2023-01-22 DIAGNOSIS — E1144 Type 2 diabetes mellitus with diabetic amyotrophy: Secondary | ICD-10-CM | POA: Diagnosis not present
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
## 2023-01-22 NOTE — Telephone Encounter (Signed)
[place] form completed signed and to medical records.

## 2023-01-23 ENCOUNTER — Telehealth: Payer: Self-pay | Admitting: *Deleted

## 2023-01-23 DIAGNOSIS — Z0289 Encounter for other administrative examinations: Secondary | ICD-10-CM

## 2023-01-23 NOTE — Telephone Encounter (Signed)
Pt [place] form and medical records faxed on 01/23/2023

## 2023-01-29 ENCOUNTER — Ambulatory Visit: Payer: Commercial Managed Care - PPO | Admitting: Physical Therapy

## 2023-01-29 DIAGNOSIS — R2689 Other abnormalities of gait and mobility: Secondary | ICD-10-CM

## 2023-01-29 DIAGNOSIS — M6281 Muscle weakness (generalized): Secondary | ICD-10-CM

## 2023-01-29 DIAGNOSIS — R2681 Unsteadiness on feet: Secondary | ICD-10-CM
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
## 2023-02-01 ENCOUNTER — Ambulatory Visit: Payer: Commercial Managed Care - PPO | Admitting: Physical Therapy

## 2023-02-01 DIAGNOSIS — R2681 Unsteadiness on feet: Secondary | ICD-10-CM

## 2023-02-01 DIAGNOSIS — M6281 Muscle weakness (generalized): Secondary | ICD-10-CM

## 2023-02-01 DIAGNOSIS — R2689 Other abnormalities of gait and mobility: Secondary | ICD-10-CM
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
## 2023-02-05 ENCOUNTER — Ambulatory Visit: Payer: Commercial Managed Care - PPO

## 2023-02-05 DIAGNOSIS — R2689 Other abnormalities of gait and mobility: Secondary | ICD-10-CM

## 2023-02-05 DIAGNOSIS — M6281 Muscle weakness (generalized): Secondary | ICD-10-CM

## 2023-02-05 DIAGNOSIS — R2681 Unsteadiness on feet: Secondary | ICD-10-CM
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
## 2023-02-08 ENCOUNTER — Ambulatory Visit: Payer: Commercial Managed Care - PPO | Admitting: Physical Therapy

## 2023-02-08 DIAGNOSIS — R2689 Other abnormalities of gait and mobility: Secondary | ICD-10-CM

## 2023-02-08 DIAGNOSIS — M6281 Muscle weakness (generalized): Secondary | ICD-10-CM

## 2023-02-08 DIAGNOSIS — R2681 Unsteadiness on feet: Secondary | ICD-10-CM
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
## 2023-02-12 ENCOUNTER — Encounter (HOSPITAL_BASED_OUTPATIENT_CLINIC_OR_DEPARTMENT_OTHER): Payer: Commercial Managed Care - PPO | Attending: General Surgery | Admitting: General Surgery

## 2023-02-12 DIAGNOSIS — E11621 Type 2 diabetes mellitus with foot ulcer: Secondary | ICD-10-CM | POA: Insufficient documentation

## 2023-02-12 DIAGNOSIS — M869 Osteomyelitis, unspecified: Secondary | ICD-10-CM | POA: Insufficient documentation

## 2023-02-12 DIAGNOSIS — L97322 Non-pressure chronic ulcer of left ankle with fat layer exposed: Secondary | ICD-10-CM | POA: Diagnosis present

## 2023-02-12 DIAGNOSIS — I872 Venous insufficiency (chronic) (peripheral): Secondary | ICD-10-CM | POA: Insufficient documentation
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
## 2023-02-13 NOTE — Progress Notes (Signed)
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 -------------------------------------------------------------------------------- SuperBill Details Patient Name: Date of Service: [name] [name], [name] [name] 02/12/2023 Medical Record Number: [id number] Patient Account Number: [id number] Date of Birth/Sex: Treating RN: Mar 28, 1961 (62 y.o. 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 Facility Procedures : CPT4 Code: [id number] Description: [id number] - WOUND CARE VISIT-LEV 3 EST PT Modifier: 25 Quantity: 1 : CPT4 Code: [id number] Description: [id number] - DEBRIDE WOUND 1ST  20 SQ CM OR < ICD-10 Diagnosis Description [id number] Non-pressure chronic ulcer of left ankle with fat layer exposed Modifier: Quantity: 1 Physician Procedures : CPT4 Code Description Modifier [id number] - WC PHYS LEVEL 4 - NEW PT ICD-10 Diagnosis Description [id number] 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

## 2023-02-13 NOTE — Progress Notes (Addendum)
[name], [name] ([id number]) [id number] Page 1 of 9 Visit Report for 02/12/2023 Allergy List Details Patient Name: Date of Service: [name] [name] 02/12/2023 10:00 A M Medical Record Number: [id number] Patient Account Number: [id number] Date of Birth/Sex: Treating RN: 02/17/1961 (62 y.o. Waldron Session Primary Care Mari Battaglia: Leonard Downing Other Clinician: Referring Jaynee Winters: Treating Katryna Tschirhart/Extender: Johnnye Sima Weeks in Treatment: 0 Allergies Active Allergies No Known Allergies Allergy Notes Electronic Signature(s) Signed: 02/12/2023 4:55:03 PM By: Blanche East RN Previous Signature: 02/12/2023 7:47:21 AM Version By: Blanche East RN Entered By: Blanche East on 02/12/2023 10:28:50 -------------------------------------------------------------------------------- Arrival Information Details Patient Name: Date of Service: [name] [name], [name] 02/12/2023 10:00 A M Medical Record Number: [id number] Patient Account Number: [id number] Date of Birth/Sex: Treating RN: 1961/11/14 (62 y.o. Waldron Session Primary Care Jahzaria Vary: Leonard Downing Other Clinician: Referring Jahmier Willadsen: Treating Trayveon Beckford/Extender: Vic Ripper in Treatment: 0 Visit Information Patient Arrived: Wheel Chair Arrival Time: 10:15 Accompanied By: mother Transfer Assistance: Manual Patient Identification Verified: Yes Secondary Verification Process Completed: Yes Patient Requires Transmission-Based Precautions: No Patient Has Alerts: No Electronic Signature(s) Signed: 02/12/2023 11:51:28 AM By: Blanche East RN Entered By: Blanche East on 02/12/2023 11:51:28 -------------------------------------------------------------------------------- Clinic Level of Care Assessment Details Patient Name: Date of Service: [name] [name] 02/12/2023 10:00 A M Medical Record Number: [id number] Patient Account Number: [id number] Date of  Birth/Sex: Treating RN: Jul 14, 1961 (62 y.o. Waldron Session Primary Care Ellakate Gonsalves: Leonard Downing Other Clinician: Referring Kolton Kienle: Treating Juneau Doughman/Extender: Johnnye Sima Weeks in Treatment: 0 Clinic Level of Care Assessment Items TOOL 1 Quantity Score X- 1 0 Use when EandM and Procedure is performed on INITIAL visit ASSESSMENTS - Nursing Assessment / Reassessment X- 1 20 General Physical Exam (combine w/ comprehensive assessment (listed just below) when performed on new pt. evals) X- 1 25 Comprehensive Assessment (HX, ROS, Risk Assessments, Wounds Hx, etc.) BRODE, OBENAUER ([id number]) [id number] Page 2 of 9 ASSESSMENTS - Wound and Skin Assessment / Reassessment '[]'$  - 0 Dermatologic / Skin Assessment (not related to wound area) ASSESSMENTS - Ostomy and/or Continence Assessment and Care '[]'$  - 0 Incontinence Assessment and Management '[]'$  - 0 Ostomy Care Assessment and Management (repouching, etc.) PROCESS - Coordination of Care X - Simple Patient / Family Education for ongoing care 1 15 '[]'$  - 0 Complex (extensive) Patient / Family Education for ongoing care X- 1 10 Staff obtains Programmer, systems, Records, T Results / Process Orders est X- 1 10 Staff telephones HHA, Nursing Homes / Clarify orders / etc '[]'$  - 0 Routine Transfer to another Facility (non-emergent condition) '[]'$  - 0 Routine Hospital Admission (non-emergent condition) X- 1 15 New Admissions / Biomedical engineer / Ordering NPWT Apligraf, etc. , '[]'$  - 0 Emergency Hospital Admission (emergent condition) PROCESS - Special Needs '[]'$  - 0 Pediatric / Minor Patient Management '[]'$  - 0 Isolation Patient Management '[]'$  - 0 Hearing / Language / Visual special needs '[]'$  - 0 Assessment of Community assistance (transportation, D/C planning, etc.) '[]'$  - 0 Additional assistance / Altered mentation '[]'$  - 0 Support Surface(s) Assessment (bed, cushion, seat, etc.) INTERVENTIONS  - Miscellaneous '[]'$  - 0 External ear exam '[]'$  - 0 Patient Transfer (multiple staff / Civil Service fast streamer / Similar devices) '[]'$  - 0 Simple Staple / Suture removal (25 or less) '[]'$  - 0 Complex Staple / Suture removal (26 or more) '[]'$  - 0 Hypo/Hyperglycemic Management (do not check if billed separately) X- 1 15 Ankle / Brachial Index (ABI) - do not  check if billed separately Has the patient been seen at the hospital within the last three years: Yes Total Score: 110 Level Of Care: New/Established - Level 3 Electronic Signature(s) Signed: 02/12/2023 4:55:03 PM By: Blanche East RN Entered By: Blanche East on 02/12/2023 11:55:47 -------------------------------------------------------------------------------- Compression Therapy Details Patient Name: Date of Service: [name] [name] 02/12/2023 10:00 A M Medical Record Number: [id number] Patient Account Number: [id number] Date of Birth/Sex: Treating RN: 1961/08/02 (62 y.o. Waldron Session Primary Care Arden Axon: Leonard Downing Other Clinician: Referring Taejah Ohalloran: Treating Miley Blanchett/Extender: Johnnye Sima Weeks in Treatment: 0 Compression Therapy Performed for Wound Assessment: Wound #1 Left,Lateral Ankle Performed By: Clinician Blanche East, RN Compression Type: Three Layer Post Procedure Diagnosis Same as Pre-procedure Electronic Signature(s) [place], [name] ([id number]) [id number] Page 3 of 9 Signed: 02/12/2023 4:55:03 PM By: Blanche East RN Entered By: Blanche East on 02/12/2023 11:15:39 -------------------------------------------------------------------------------- Encounter Discharge Information Details Patient Name: Date of Service: [name] [name], [name] 02/12/2023 10:00 A M Medical Record Number: [id number] Patient Account Number: [id number] Date of Birth/Sex: Treating RN: 06-26-1961 (62 y.o. Waldron Session Primary Care Gracemarie Skeet: Leonard Downing Other Clinician: Referring  [name] [name]: Treating Rishit Burkhalter/Extender: Johnnye Sima Weeks in Treatment: 0 Encounter Discharge Information Items Post Procedure Vitals Discharge Condition: Stable Temperature (F): 98.4 Ambulatory Status: Wheelchair Pulse (bpm): 105 Discharge Destination: Home Respiratory Rate (breaths/min): 18 Transportation: Private Auto Blood Pressure (mmHg): 122/81 Accompanied By: mother Schedule Follow-up Appointment: Yes Clinical Summary of Care: Electronic Signature(s) Signed: 02/12/2023 11:56:51 AM By: Blanche East RN Entered By: Blanche East on 02/12/2023 11:56:51 -------------------------------------------------------------------------------- Lower Extremity Assessment Details Patient Name: Date of Service: [name] [name] 02/12/2023 10:00 A M Medical Record Number: [id number] Patient Account Number: [id number] Date of Birth/Sex: Treating RN: 02-May-1961 (62 y.o. Waldron Session Primary Care Tigerlily Christine: Leonard Downing Other Clinician: Referring Kolbee Stallman: Treating Danah Reinecke/Extender: Johnnye Sima Weeks in Treatment: 0 Edema Assessment Assessed: [Left: No] [Right: No] [Left: Edema] [Right: :] Calf Left: Right: Point of Measurement: From Medial Instep 37 cm Ankle Left: Right: Point of Measurement: From Medial Instep 31 cm Vascular Assessment Pulses: Dorsalis Pedis Palpable: [Left:Yes] Doppler Audible: [Left:Yes] Blood Pressure: Brachial: [Left:122] Ankle: [Left:Dorsalis Pedis: 150 1.23] Electronic Signature(s) Signed: 02/12/2023 4:55:03 PM By: Blanche East RN Entered By: Blanche East on 02/12/2023 10:46:04 [name] ([id number].pdf Page 4 of 9 -------------------------------------------------------------------------------- Multi Wound Chart Details Patient Name: Date of Service: [name] [name] 02/12/2023 10:00 A M Medical Record Number: [id number] Patient Account Number: [id number] Date of  Birth/Sex: Treating RN: 05/19/1961 (62 y.o. M) Primary Care Aivah Putman: Leonard Downing Other Clinician: Referring Maison Kestenbaum: Treating Keir Viernes/Extender: Johnnye Sima Weeks in Treatment: 0 Vital Signs Height(in): 70 Pulse(bpm): 105 Weight(lbs): 196 Blood Pressure(mmHg): 122/81 Body Mass Index(BMI): 28.1 Temperature(F): 98.4 Respiratory Rate(breaths/min): 18 [1:Photos: No Photos] [N/A:N/A] Left, Lateral Ankle Left, Lateral Ankle N/A Wound Location: Shear/Friction Shear/Friction N/A Wounding Event: Diabetic Wound/Ulcer of the Lower Diabetic Wound/Ulcer of the Lower N/A Primary Etiology: Extremity Extremity Abscess Abscess N/A Secondary Etiology: Anemia, Hypertension, Type II Anemia, Hypertension, Type II N/A Comorbid History: Diabetes, Neuropathy Diabetes, Neuropathy 12/16/2019 12/16/2019 N/A Date Acquired: 0 0 N/A Weeks of Treatment: Open Open N/A Wound Status: No No N/A Wound Recurrence: 0.5x0.5x0.6 0.5x0.5x0.6 N/A Measurements L x W x D (cm) 0.196 0.196 N/A A (cm) : rea 0.118 0.118 N/A Volume (cm) : Grade 1 Grade 1 N/A Classification: Medium Medium N/A Exudate A mount: Purulent Purulent N/A Exudate Type: yellow, brown, green yellow, brown,  green N/A Exudate Color: Small (1-33%) Small (1-33%) N/A Granulation A mount: Large (67-100%) Large (67-100%) N/A Necrotic A mount: Eschar, Adherent Slough Eschar, Adherent Slough N/A Necrotic Tissue: Fat Layer (Subcutaneous Tissue): Yes Fat Layer (Subcutaneous Tissue): Yes N/A Exposed Structures: Fascia: No Fascia: No Tendon: No Tendon: No Muscle: No Muscle: No Bone: No Bone: No Small (1-33%) Small (1-33%) N/A Epithelialization: Debridement - Selective/Open Wound Debridement - Selective/Open Wound N/A Debridement: Pre-procedure Verification/Time Out 11:11 11:11 N/A Taken: Lidocaine 4% Topical Solution Lidocaine 4% Topical Solution N/A Pain Control: [employer] N/A Tissue  Debrided: Non-Viable Tissue Non-Viable Tissue N/A Level: 0.25 0.25 N/A Debridement A (sq cm): rea Curette Curette N/A Instrument: Moderate Moderate N/A Bleeding: Pressure Pressure N/A Hemostasis A chieved: 8 8 N/A Procedural Pain: 0 0 N/A Post Procedural Pain: Procedure was tolerated well Procedure was tolerated well N/A Debridement Treatment Response: 0.5x0.5x0.6 0.5x0.5x0.6 N/A Post Debridement Measurements L x W x D (cm) 0.118 0.118 N/A Post Debridement Volume: (cm) Scarring: Yes Scarring: Yes N/A Periwound Skin Texture: Dry/Scaly: Yes Dry/Scaly: Yes N/A Periwound Skin Moisture: Rubor: Yes Rubor: Yes N/A Periwound Skin Color: No Abnormality No Abnormality N/A Temperature: Yes Yes N/A Tenderness on Palpation: Compression Therapy Compression Therapy N/A Procedures Performed: Debridement Debridement Treatment Notes HAKIM, HANN ([id number] Page 5 of 9 Electronic Signature(s) Signed: 02/12/2023 11:35:27 AM By: Fredirick Maudlin MD FACS Entered By: Fredirick Maudlin on 02/12/2023 11:35:27 -------------------------------------------------------------------------------- Multi-Disciplinary Care Plan Details Patient Name: Date of Service: [name] [name], [name] 02/12/2023 10:00 A M Medical Record Number: [id number] Patient Account Number: [id number] Date of Birth/Sex: Treating RN: 21-May-1961 (62 y.o. Waldron Session Primary Care Zanylah Hardie: Leonard Downing Other Clinician: Referring Melenda Bielak: Treating Skylor Schnapp/Extender: Johnnye Sima Weeks in Treatment: 0 Active Inactive Nutrition Nursing Diagnoses: Potential for alteratiion in Nutrition/Potential for imbalanced nutrition Goals: Patient/caregiver verbalizes understanding of need to maintain therapeutic glucose control per primary care physician Date Initiated: 02/12/2023 Target Resolution Date: 03/19/2023 Goal Status: Active Interventions: Provide education  on elevated blood sugars and impact on wound healing Treatment Activities: Dietary management education, guidance and counseling : 02/12/2023 Notes: Venous Leg Ulcer Nursing Diagnoses: Knowledge deficit related to disease process and management Goals: Patient will maintain optimal edema control Date Initiated: 02/12/2023 Target Resolution Date: 04/23/2023 Goal Status: Active Interventions: Compression as ordered Notes: Wound/Skin Impairment Nursing Diagnoses: Knowledge deficit related to ulceration/compromised skin integrity Goals: Ulcer/skin breakdown will have a volume reduction of 30% by week 4 Date Initiated: 02/12/2023 Target Resolution Date: 03/26/2023 Goal Status: Active Interventions: Assess ulceration(s) every visit Provide education on ulcer and skin care Treatment Activities: Skin care regimen initiated : 02/12/2023 Topical wound management initiated : 02/12/2023 Notes: Electronic Signature(s) Signed: 02/12/2023 11:53:45 AM By: Blanche East RN Theodis Shove, [name] ([id number]) [id number] Page 6 of 9 Entered By: Blanche East on 02/12/2023 11:53:45 -------------------------------------------------------------------------------- Pain Assessment Details Patient Name: Date of Service: [name] [name] 02/12/2023 10:00 A M Medical Record Number: [id number] Patient Account Number: [id number] Date of Birth/Sex: Treating RN: 01-22-1961 (62 y.o. Waldron Session Primary Care Deepa Barthel: Leonard Downing Other Clinician: Referring Victormanuel Mclure: Treating Armistead Sult/Extender: Johnnye Sima Weeks in Treatment: 0 Active Problems Location of Pain Severity and Description of Pain Patient Has Paino No Site Locations Rate the pain. Current Pain Level: 5 Character of Pain Describe the Pain: Burning, Sharp Pain Management and Medication Current Pain Management: Electronic Signature(s) Signed: 02/12/2023 4:55:03 PM By: Blanche East RN Entered By:  Blanche East on 02/12/2023 11:02:38 -------------------------------------------------------------------------------- Patient/Caregiver Education Details Patient Name: Date  of Service: [name] [name] 3/4/2024andnbsp10:00 A M Medical Record Number: [id number] Patient Account Number: [id number] Date of Birth/Gender: Treating RN: March 20, 1961 (62 y.o. Waldron Session Primary Care Physician: Leonard Downing Other Clinician: Referring Physician: Treating Physician/Extender: Vic Ripper in Treatment: 0 Education Assessment Education Provided To: Patient Education Topics Provided Nutrition: Methods: Explain/Verbal Responses: Reinforcements needed, State content correctly Welcome T [facility]New Patient Packet: o Methods: Explain/Verbal Responses: Reinforcements needed, State content correctly [place], [name] ([id number]) [id number] Page 7 of 9 Wound Debridement: Methods: Explain/Verbal Responses: Reinforcements needed Wound/Skin Impairment: Methods: Explain/Verbal Responses: Reinforcements needed, State content correctly Electronic Signature(s) Signed: 02/12/2023 4:55:03 PM By: Blanche East RN Entered By: Blanche East on 02/12/2023 11:54:48 -------------------------------------------------------------------------------- Wound Assessment Details Patient Name: Date of Service: [name] [name] 02/12/2023 10:00 A M Medical Record Number: [id number] Patient Account Number: [id number] Date of Birth/Sex: Treating RN: 07-19-1961 (62 y.o. Waldron Session Primary Care Haziel Molner: Leonard Downing Other Clinician: Referring Shamonica Schadt: Treating Davielle Lingelbach/Extender: Johnnye Sima Weeks in Treatment: 0 Wound Status Wound Number: 1 Primary Etiology: Diabetic Wound/Ulcer of the Lower Extremity Wound Location: Left, Lateral Ankle Secondary Etiology: Abscess Wounding Event: Shear/Friction Wound  Status: Open Date Acquired: 12/16/2019 Comorbid History: Anemia, Hypertension, Type II Diabetes, Neuropathy Weeks Of Treatment: 0 Clustered Wound: No Wound Measurements Length: (cm) 0.5 Width: (cm) 0.5 Depth: (cm) 0.6 Area: (cm) 0.196 Volume: (cm) 0.118 % Reduction in Area: % Reduction in Volume: Epithelialization: Small (1-33%) Tunneling: No Undermining: No Wound Description Classification: Grade 1 Exudate Amount: Medium Exudate Type: Purulent Exudate Color: yellow, brown, green Foul Odor After Cleansing: No Slough/Fibrino Yes Wound Bed Granulation Amount: Small (1-33%) Exposed Structure Necrotic Amount: Large (67-100%) Fascia Exposed: No Necrotic Quality: Eschar, Adherent Slough Fat Layer (Subcutaneous Tissue) Exposed: Yes Tendon Exposed: No Muscle Exposed: No Bone Exposed: No Periwound Skin Texture Texture Color No Abnormalities Noted: No No Abnormalities Noted: No Scarring: Yes Rubor: Yes Moisture Temperature / Pain No Abnormalities Noted: No Temperature: No Abnormality Dry / Scaly: Yes Tenderness on Palpation: Yes Electronic Signature(s) Signed: 02/12/2023 4:55:03 PM By: Blanche East RN Entered By: Blanche East on 02/12/2023 11:00:53 Wound Assessment Details -------------------------------------------------------------------------------- [name] ([id number].pdf Page 8 of 9 Patient Name: Date of Service: [name] [name] 02/12/2023 10:00 A M Medical Record Number: [id number] Patient Account Number: [id number] Date of Birth/Sex: Treating RN: 1960-12-30 (63 y.o. Waldron Session Primary Care Iban Utz: Leonard Downing Other Clinician: Referring Kamaree Wheatley: Treating Ivelise Castillo/Extender: Johnnye Sima Weeks in Treatment: 0 Wound Status Wound Number: 1 Primary Etiology: Diabetic Wound/Ulcer of the Lower Extremity Wound Location: Left, Lateral Ankle Secondary Etiology: Abscess Wounding Event:  Shear/Friction Wound Status: Open Date Acquired: 12/16/2019 Comorbid History: Anemia, Hypertension, Type II Diabetes, Neuropathy Weeks Of Treatment: 0 Clustered Wound: No Photos Wound Measurements Length: (cm) 0.5 % Reduction in Area: Width: (cm) 0.5 % Reduction in Volume: Depth: (cm) 0.6 Epithelialization: Small (1-33%) Area: (cm) 0.196 Tunneling: No Volume: (cm) 0.118 Undermining: No Wound Description Classification: Grade 1 Foul Odor After Cleansing: No Exudate Amount: Medium Slough/Fibrino Yes Exudate Type: Purulent Exudate Color: yellow, brown, green Wound Bed Granulation Amount: Small (1-33%) Exposed Structure Necrotic Amount: Large (67-100%) Fascia Exposed: No Necrotic Quality: Eschar, Adherent Slough Fat Layer (Subcutaneous Tissue) Exposed: Yes Tendon Exposed: No Muscle Exposed: No Bone Exposed: No Periwound Skin Texture Texture Color No Abnormalities Noted: No No Abnormalities Noted: No Scarring: Yes Rubor: Yes Moisture Temperature / Pain No Abnormalities Noted: No Temperature: No Abnormality Dry /  Scaly: Yes Tenderness on Palpation: Yes Treatment Notes Wound #1 (Ankle) Wound Laterality: Left, Lateral Cleanser Soap and Water Discharge Instruction: May shower and wash wound with dial antibacterial soap and water prior to dressing change. Wound Cleanser Discharge Instruction: Cleanse the wound with wound cleanser prior to applying a clean dressing using gauze sponges, not tissue or cotton balls. Peri-Wound Care Topical Primary Dressing SOPHANA, MONTEROSSO ([id number]) [id number] Page 9 of 9 Iodoform packing strip 1/4 (in) Discharge Instruction: Lightly pack as instructed Secondary Dressing Zetuvit Plus 4x8 in Discharge Instruction: Apply over primary dressing as directed. Secured With Transpore Surgical Tape, 2x10 (in/yd) Discharge Instruction: Secure dressing with tape as directed. Compression Wrap ThreePress (3 layer  compression wrap) Discharge Instruction: Apply three layer compression as directed. Compression Stockings Add-Ons Electronic Signature(s) Signed: 02/12/2023 4:55:03 PM By: Blanche East RN Entered By: Blanche East on 02/12/2023 11:01:54 -------------------------------------------------------------------------------- Vitals Details Patient Name: Date of Service: [name] [name], [name] 02/12/2023 10:00 A M Medical Record Number: [id number] Patient Account Number: [id number] Date of Birth/Sex: Treating RN: Mar 24, 1961 (62 y.o. Waldron Session Primary Care Logyn Dedominicis: Leonard Downing Other Clinician: Referring Myeasha Ballowe: Treating Kaylinn Dedic/Extender: Johnnye Sima Weeks in Treatment: 0 Vital Signs Time Taken: 10:20 Temperature (F): 98.4 Height (in): 70 Pulse (bpm): 105 Source: Stated Respiratory Rate (breaths/min): 18 Weight (lbs): 196 Blood Pressure (mmHg): 122/81 Source: Stated Reference Range: 80 - 120 mg / dl Body Mass Index (BMI): 28.1 Electronic Signature(s) Signed: 02/12/2023 4:55:03 PM By: Blanche East RN Entered By: Blanche East on 02/12/2023 10:28:42

## 2023-02-14 ENCOUNTER — Ambulatory Visit: Payer: Commercial Managed Care - PPO | Attending: Neurology

## 2023-02-14 DIAGNOSIS — M6281 Muscle weakness (generalized): Secondary | ICD-10-CM | POA: Insufficient documentation

## 2023-02-14 DIAGNOSIS — R2689 Other abnormalities of gait and mobility: Secondary | ICD-10-CM | POA: Insufficient documentation

## 2023-02-14 DIAGNOSIS — R2681 Unsteadiness on feet: Secondary | ICD-10-CM | POA: Insufficient documentation
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
## 2023-02-16 ENCOUNTER — Ambulatory Visit: Payer: Commercial Managed Care - PPO | Admitting: Physical Therapy

## 2023-02-19 ENCOUNTER — Ambulatory Visit: Payer: Commercial Managed Care - PPO

## 2023-02-19 ENCOUNTER — Ambulatory Visit: Payer: Commercial Managed Care - PPO | Admitting: Physical Therapy

## 2023-02-19 DIAGNOSIS — R2681 Unsteadiness on feet: Secondary | ICD-10-CM

## 2023-02-19 DIAGNOSIS — R2689 Other abnormalities of gait and mobility: Secondary | ICD-10-CM

## 2023-02-19 DIAGNOSIS — M6281 Muscle weakness (generalized): Secondary | ICD-10-CM
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
## 2023-02-21 ENCOUNTER — Encounter (HOSPITAL_BASED_OUTPATIENT_CLINIC_OR_DEPARTMENT_OTHER): Payer: Commercial Managed Care - PPO | Admitting: Internal Medicine

## 2023-02-21 DIAGNOSIS — E11622 Type 2 diabetes mellitus with other skin ulcer: Secondary | ICD-10-CM

## 2023-02-21 DIAGNOSIS — E11621 Type 2 diabetes mellitus with foot ulcer: Secondary | ICD-10-CM | POA: Diagnosis not present

## 2023-02-21 DIAGNOSIS — L97322 Non-pressure chronic ulcer of left ankle with fat layer exposed: Secondary | ICD-10-CM | POA: Diagnosis not present

## 2023-02-22 ENCOUNTER — Ambulatory Visit: Payer: Commercial Managed Care - PPO | Admitting: Physical Therapy

## 2023-02-22 NOTE — Progress Notes (Addendum)
[name], [name] ([id number]) [id number] Page 1 of 8 Visit Report for 02/21/2023 Arrival Information Details Patient Name: Date of Service: [name] [name] 02/21/2023 3:15 PM Medical Record Number: [id number] Patient Account Number: [id number] Date of Birth/Sex: Treating RN: Dec 03, 1961 (62 y.o. Waldron Session Primary Care [name] [name]: [name] [name] Other Clinician: Referring [name] [name]: Treating [name] [name]/Extender: [name] [name] in Treatment: 1 Visit Information History Since Last Visit Added or deleted any medications: No Patient Arrived: Wheel Chair Any new allergies or adverse reactions: No Arrival Time: 15:09 Had a fall or experienced change in No Accompanied By: father activities of daily living that may affect Transfer Assistance: Manual risk of falls: Patient Requires Transmission-Based Precautions: No Signs or symptoms of abuse/neglect since last visito No Patient Has Alerts: No Hospitalized since last visit: No Implantable device outside of the clinic excluding No cellular tissue based products placed in the center since last visit: Has Compression in Place as Prescribed: Yes Pain Present Now: Yes Electronic Signature(s) Signed: 02/21/2023 4:29:19 PM By: [name] East RN Entered By: [name] [name] on 02/21/2023 15:19:25 -------------------------------------------------------------------------------- Clinic Level of Care Assessment Details Patient Name: Date of Service: [name] [name] 02/21/2023 3:15 PM Medical Record Number: [id number] Patient Account Number: [id number] Date of Birth/Sex: Treating RN: 02-09-1961 (62 y.o. Waldron Session Primary Care [name] [name]: [name] [name] Other Clinician: Referring Arriah [name]: Treating [name] [name]/Extender: [name] [name] in Treatment: 1 Clinic Level of Care Assessment Items TOOL 4 Quantity Score X- 1 0 Use when only an EandM is  performed on FOLLOW-UP visit ASSESSMENTS - Nursing Assessment / Reassessment X- 1 10 Reassessment of Co-morbidities (includes updates in patient status) X- 1 5 Reassessment of Adherence to Treatment Plan ASSESSMENTS - Wound and Skin A ssessment / Reassessment X - Simple Wound Assessment / Reassessment - one wound 1 5 '[]'$  - 0 Complex Wound Assessment / Reassessment - multiple wounds '[]'$  - 0 Dermatologic / Skin Assessment (not related to wound area) ASSESSMENTS - Focused Assessment X- 1 5 Circumferential Edema Measurements - multi extremities '[]'$  - 0 Nutritional Assessment / Counseling / Intervention '[]'$  - 0 Lower Extremity Assessment (monofilament, tuning fork, pulses) '[]'$  - 0 Peripheral Arterial Disease Assessment (using hand held doppler) ASSESSMENTS - Ostomy and/or Continence Assessment and Care '[]'$  - 0 Incontinence Assessment and Management '[]'$  - 0 Ostomy Care Assessment and Management (repouching, etc.) PROCESS - Coordination of Care X - Simple Patient / Family Education for ongoing care 1 15 [place], [place] ([id number]) [id number] Page 2 of 8 '[]'$  - 0 Complex (extensive) Patient / Family Education for ongoing care X- 1 10 Staff obtains Consents, Records, T Results / Process Orders est X- 1 10 Staff telephones HHA, Nursing Homes / Clarify orders / etc '[]'$  - 0 Routine Transfer to another Facility (non-emergent condition) '[]'$  - 0 Routine Hospital Admission (non-emergent condition) '[]'$  - 0 New Admissions / Biomedical engineer / Ordering NPWT Apligraf, etc. , '[]'$  - 0 Emergency Hospital Admission (emergent condition) '[]'$  - 0 Simple Discharge Coordination '[]'$  - 0 Complex (extensive) Discharge Coordination PROCESS - Special Needs '[]'$  - 0 Pediatric / Minor Patient Management '[]'$  - 0 Isolation Patient Management '[]'$  - 0 Hearing / Language / Visual special needs '[]'$  - 0 Assessment of Community assistance (transportation, D/C planning, etc.) '[]'$  -  0 Additional assistance / Altered mentation '[]'$  - 0 Support Surface(s) Assessment (bed, cushion, seat, etc.) INTERVENTIONS - Wound Cleansing / Measurement X - Simple Wound Cleansing - one wound 1 5 '[]'$  -  0 Complex Wound Cleansing - multiple wounds X- 1 5 Wound Imaging (photographs - any number of wounds) '[]'$  - 0 Wound Tracing (instead of photographs) X- 1 5 Simple Wound Measurement - one wound '[]'$  - 0 Complex Wound Measurement - multiple wounds INTERVENTIONS - Wound Dressings X - Small Wound Dressing one or multiple wounds 1 10 '[]'$  - 0 Medium Wound Dressing one or multiple wounds '[]'$  - 0 Large Wound Dressing one or multiple wounds '[]'$  - 0 Application of Medications - topical '[]'$  - 0 Application of Medications - injection INTERVENTIONS - Miscellaneous '[]'$  - 0 External ear exam '[]'$  - 0 Specimen Collection (cultures, biopsies, blood, body fluids, etc.) '[]'$  - 0 Specimen(s) / Culture(s) sent or taken to Lab for analysis '[]'$  - 0 Patient Transfer (multiple staff / Civil Service fast streamer / Similar devices) '[]'$  - 0 Simple Staple / Suture removal (25 or less) '[]'$  - 0 Complex Staple / Suture removal (26 or more) '[]'$  - 0 Hypo / Hyperglycemic Management (close monitor of Blood Glucose) '[]'$  - 0 Ankle / Brachial Index (ABI) - do not check if billed separately X- 1 5 Vital Signs Has the patient been seen at the hospital within the last three years: Yes Total Score: 90 Level Of Care: New/Established - Level 3 Electronic Signature(s) Signed: 02/21/2023 4:29:19 PM By: [name] East RN Entered By: [name] [name] on 02/21/2023 16:27:46 [name] [name] ([id number]) [id number] Page 3 of 8 -------------------------------------------------------------------------------- Encounter Discharge Information Details Patient Name: Date of Service: [name] [name] 02/21/2023 3:15 PM Medical Record Number: [id number] Patient Account Number: [id number] Date of Birth/Sex: Treating RN: Mar 05, 1961  (62 y.o. Waldron Session Primary Care Tatum Massman: [name] [name] Other Clinician: Referring Sharese Manrique: Treating Analeigha Nauman/Extender: [name] [name] in Treatment: 1 Encounter Discharge Information Items Discharge Condition: Stable Ambulatory Status: Ambulatory Discharge Destination: Home Transportation: Private Auto Accompanied By: father Schedule Follow-up Appointment: Yes Clinical Summary of Care: Electronic Signature(s) Signed: 02/21/2023 4:29:19 PM By: [name] East RN Entered By: [name] [name] on 02/21/2023 15:38:57 -------------------------------------------------------------------------------- Lower Extremity Assessment Details Patient Name: Date of Service: [name] [name] 02/21/2023 3:15 PM Medical Record Number: [id number] Patient Account Number: [id number] Date of Birth/Sex: Treating RN: [birth year] (62 y.o. Waldron Session Primary Care Avish Torry: [name] [name] Other Clinician: Referring Aislynn Cifelli: Treating Adeleigh Barletta/Extender: Dellia Nims Weeks in Treatment: 1 Edema Assessment Assessed: [Left: No] [Right: No] [Left: Edema] [Right: :] Calf Left: Right: Point of Measurement: From Medial Instep 34.5 cm Ankle Left: Right: Point of Measurement: From Medial Instep 31.2 cm Vascular Assessment Pulses: Dorsalis Pedis Palpable: [Left:Yes] Electronic Signature(s) Signed: 02/21/2023 4:29:19 PM By: [name] East RN Entered By: [name] [name] on 02/21/2023 15:22:34 -------------------------------------------------------------------------------- Multi Wound Chart Details Patient Name: Date of Service: [name] 02/21/2023 3:15 PM Medical Record Number: [id number] Patient Account Number: [id number] Date of Birth/Sex: Treating RN: 1961-02-13 (62 y.o. M) Primary Care Vieno Tarrant: [name] [name] Other Clinician: Referring Janita Camberos: Treating Asra Gambrel/Extender: [name] [name] in Treatment: 1 [place], [name] ([id number]) [id number] Page 4 of 8 Vital Signs Height(in): 70 Pulse(bpm): 106 Weight(lbs): 196 Blood Pressure(mmHg): 133/86 Body Mass Index(BMI): 28.1 Temperature(F): 98.5 Respiratory Rate(breaths/min): 18 [1:Photos:] [N/A:N/A] Left, Lateral Ankle N/A N/A Wound Location: Shear/Friction N/A N/A Wounding Event: Diabetic Wound/Ulcer of the Lower N/A N/A Primary Etiology: Extremity Abscess N/A N/A Secondary Etiology: Anemia, Hypertension, Type II N/A N/A Comorbid History: Diabetes, Neuropathy 12/16/2019 N/A N/A Date Acquired: 1 N/A N/A Weeks of Treatment: Open N/A N/A Wound Status: No  N/A N/A Wound Recurrence: 0.8x0.4x0.5 N/A N/A Measurements L x W x D (cm) 0.251 N/A N/A A (cm) : rea 0.126 N/A N/A Volume (cm) : -28.10% N/A N/A % Reduction in A rea: -6.80% N/A N/A % Reduction in Volume: Grade 1 N/A N/A Classification: Medium N/A N/A Exudate A mount: Purulent N/A N/A Exudate Type: yellow, brown, green N/A N/A Exudate Color: Large (67-100%) N/A N/A Granulation A mount: Small (1-33%) N/A N/A Necrotic A mount: Fat Layer (Subcutaneous Tissue): Yes N/A N/A Exposed Structures: Fascia: No Tendon: No Muscle: No Bone: No Small (1-33%) N/A N/A Epithelialization: Scarring: Yes N/A N/A Periwound Skin Texture: Dry/Scaly: Yes N/A N/A Periwound Skin Moisture: Rubor: Yes N/A N/A Periwound Skin Color: No Abnormality N/A N/A Temperature: Yes N/A N/A Tenderness on Palpation: Treatment Notes Wound #1 (Ankle) Wound Laterality: Left, Lateral Cleanser Soap and Water Discharge Instruction: May shower and wash wound with dial antibacterial soap and water prior to dressing change. Wound Cleanser Discharge Instruction: Cleanse the wound with wound cleanser prior to applying a clean dressing using gauze sponges, not tissue or cotton balls. Peri-Wound Care Topical Primary Dressing Iodoform  packing strip 1/4 (in) Discharge Instruction: Lightly pack as instructed Secondary Dressing Optifoam Non-Adhesive Dressing, 4x4 in Discharge Instruction: Apply over primary dressing as directed. Zetuvit Plus 4x8 in Discharge Instruction: Apply over primary dressing as directed. Secured With [name] [name] ([id number]) [id number] Page 5 of 8 Transpore Surgical Tape, 2x10 (in/yd) Discharge Instruction: Secure dressing with tape as directed. Compression Wrap ThreePress (3 layer compression wrap) Discharge Instruction: Apply three layer compression as directed. Compression Stockings Add-Ons Electronic Signature(s) Signed: 02/22/2023 3:34:03 PM By: Kalman Shan DO Entered By: Kalman Shan on 02/21/2023 16:46:57 -------------------------------------------------------------------------------- Multi-Disciplinary Care Plan Details Patient Name: Date of Service: [name], [place] 02/21/2023 3:15 PM Medical Record Number: [id number] Patient Account Number: [id number] Date of Birth/Sex: Treating RN: [birth year] (62 y.o. Waldron Session Primary Care Nichola Warren: [name] [name] Other Clinician: Referring Cleavon Goldman: Treating Lavoy Bernards/Extender: [name] [name] in Treatment: 1 Active Inactive Nutrition Nursing Diagnoses: Potential for alteratiion in Nutrition/Potential for imbalanced nutrition Goals: Patient/caregiver verbalizes understanding of need to maintain therapeutic glucose control per primary care physician Date Initiated: 02/12/2023 Target Resolution Date: 03/19/2023 Goal Status: Active Interventions: Provide education on elevated blood sugars and impact on wound healing Treatment Activities: Dietary management education, guidance and counseling : 02/12/2023 Notes: Venous Leg Ulcer Nursing Diagnoses: Knowledge deficit related to disease process and management Goals: Patient will maintain optimal edema control Date  Initiated: 02/12/2023 Target Resolution Date: 04/23/2023 Goal Status: Active Interventions: Compression as ordered Notes: Wound/Skin Impairment Nursing Diagnoses: Knowledge deficit related to ulceration/compromised skin integrity Goals: Ulcer/skin breakdown will have a volume reduction of 30% by week 4 Date Initiated: 02/12/2023 Target Resolution Date: 03/26/2023 Goal Status: Active Interventions: Assess ulceration(s) every visit JARELL, MCEWEN ([id number]) [id number] Page 6 of 8 Provide education on ulcer and skin care Treatment Activities: Skin care regimen initiated : 02/12/2023 Topical wound management initiated : 02/12/2023 Notes: Electronic Signature(s) Signed: 02/21/2023 4:29:19 PM By: [name] East RN Entered By: [name] [name] on 02/21/2023 15:52:27 -------------------------------------------------------------------------------- Pain Assessment Details Patient Name: Date of Service: [name] [name] 02/21/2023 3:15 PM Medical Record Number: [id number] Patient Account Number: [id number] Date of Birth/Sex: Treating RN: 07-12-1961 (62 y.o. Waldron Session Primary Care Jontue Crumpacker: [name] [name] Other Clinician: Referring Crystalle Popwell: Treating Shardai Star/Extender: [name] [name] in Treatment: 1 Active Problems Location of Pain Severity and Description of Pain Patient Has Paino Yes Site  Locations Pain Location: Pain in Ulcers Rate the pain. Current Pain Level: 6 Worst Pain Level: 8 Character of Pain Describe the Pain: Burning Pain Management and Medication Current Pain Management: Electronic Signature(s) Signed: 02/21/2023 4:29:19 PM By: Tommie Ard RN Entered By: Tommie Ard on 02/21/2023 15:20:14 -------------------------------------------------------------------------------- Patient/Caregiver Education Details Patient Name: Date of Service: Magdalen Spatz 3/13/2024andnbsp3:15 PM Medical Record  Number: [id number] Patient Account Number: [id number] Date of Birth/Gender: Treating RN: 1961/01/16 (62 y.o. Valma Cava Primary Care Physician: Kaleen Mask Other Clinician: Referring Physician: Treating Physician/Extender: Comer Locket in Treatment: 1 Education Assessment Donney Dice ([id number]) [id number] Page 7 of 8 Education Provided To: Patient Education Topics Provided Wound Debridement: Methods: Explain/Verbal Responses: Reinforcements needed, State content correctly Wound/Skin Impairment: Methods: Explain/Verbal Responses: Reinforcements needed, State content correctly Electronic Signature(s) Signed: 02/21/2023 4:29:19 PM By: Tommie Ard RN Entered By: Tommie Ard on 02/21/2023 15:38:12 -------------------------------------------------------------------------------- Wound Assessment Details Patient Name: Date of Service: Magdalen Spatz 02/21/2023 3:15 PM Medical Record Number: [id number] Patient Account Number: [id number] Date of Birth/Sex: Treating RN: [birth year] (62 y.o. Valma Cava Primary Care Seham Gardenhire: Kaleen Mask Other Clinician: Referring Sunshyne Horvath: Treating Thoren Hosang/Extender: Lorenza Evangelist Weeks in Treatment: 1 Wound Status Wound Number: 1 Primary Etiology: Diabetic Wound/Ulcer of the Lower Extremity Wound Location: Left, Lateral Ankle Secondary Etiology: Abscess Wounding Event: Shear/Friction Wound Status: Open Date Acquired: 12/16/2019 Comorbid History: Anemia, Hypertension, Type II Diabetes, Neuropathy Weeks Of Treatment: 1 Clustered Wound: No Photos Wound Measurements Length: (cm) 0.8 Width: (cm) 0.4 Depth: (cm) 0.5 Area: (cm) 0.251 Volume: (cm) 0.126 % Reduction in Area: -28.1% % Reduction in Volume: -6.8% Epithelialization: Small (1-33%) Undermining: No Wound Description Classification: Grade 1 Exudate Amount:  Medium Exudate Type: Purulent Exudate Color: yellow, brown, green Foul Odor After Cleansing: No Slough/Fibrino Yes Wound Bed Granulation Amount: Large (67-100%) Exposed Structure Necrotic Amount: Small (1-33%) Fascia Exposed: No Necrotic Quality: Adherent Slough Fat Layer (Subcutaneous Tissue) Exposed: Yes Tendon Exposed: No Muscle Exposed: No Bone Exposed: No Donney Dice ([id number]) [id number] Page 8 of 8 Periwound Skin Texture Texture Color No Abnormalities Noted: No No Abnormalities Noted: No Scarring: Yes Rubor: Yes Moisture Temperature / Pain No Abnormalities Noted: No Temperature: No Abnormality Dry / Scaly: Yes Tenderness on Palpation: Yes Treatment Notes Wound #1 (Ankle) Wound Laterality: Left, Lateral Cleanser Soap and Water Discharge Instruction: May shower and wash wound with dial antibacterial soap and water prior to dressing change. Wound Cleanser Discharge Instruction: Cleanse the wound with wound cleanser prior to applying a clean dressing using gauze sponges, not tissue or cotton balls. Peri-Wound Care Topical Primary Dressing Iodoform packing strip 1/4 (in) Discharge Instruction: Lightly pack as instructed Secondary Dressing Optifoam Non-Adhesive Dressing, 4x4 in Discharge Instruction: Apply over primary dressing as directed. Zetuvit Plus 4x8 in Discharge Instruction: Apply over primary dressing as directed. Secured With Transpore Surgical Tape, 2x10 (in/yd) Discharge Instruction: Secure dressing with tape as directed. Compression Wrap ThreePress (3 layer compression wrap) Discharge Instruction: Apply three layer compression as directed. Compression Stockings Add-Ons Electronic Signature(s) Signed: 02/21/2023 4:29:19 PM By: Tommie Ard RN Entered By: Tommie Ard on 02/21/2023 15:31:04 -------------------------------------------------------------------------------- Vitals Details Patient Name: Date of  Service: Berna Spare, [name] 02/21/2023 3:15 PM Medical Record Number: [id number] Patient Account Number: [id number] Date of Birth/Sex: Treating RN: [birth year] (62 y.o. Valma Cava Primary Care Daveyon Kitchings: Kaleen Mask Other Clinician: Referring Tevis Dunavan: Treating Ellia Knowlton/Extender: Lorenza Evangelist Weeks in Treatment: 1 Vital Signs Time  Taken: 15:18 Temperature (F): 98.5 Height (in): 70 Pulse (bpm): 106 Weight (lbs): 196 Respiratory Rate (breaths/min): 18 Body Mass Index (BMI): 28.1 Blood Pressure (mmHg): 133/86 Reference Range: 80 - 120 mg / dl Electronic Signature(s) Signed: 02/21/2023 4:29:19 PM By: [name] East RN Entered By: [name] [name] on 02/21/2023 15:19:47

## 2023-02-23 ENCOUNTER — Ambulatory Visit: Payer: Commercial Managed Care - PPO | Admitting: Physical Therapy
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
## 2023-02-26 ENCOUNTER — Encounter (HOSPITAL_COMMUNITY): Payer: Self-pay

## 2023-02-26 ENCOUNTER — Ambulatory Visit: Payer: Commercial Managed Care - PPO

## 2023-02-26 ENCOUNTER — Other Ambulatory Visit (HOSPITAL_COMMUNITY): Payer: Self-pay | Admitting: Internal Medicine

## 2023-02-26 ENCOUNTER — Ambulatory Visit (HOSPITAL_COMMUNITY): Admission: RE | Admit: 2023-02-26 | Payer: Commercial Managed Care - PPO | Source: Ambulatory Visit

## 2023-02-26 ENCOUNTER — Ambulatory Visit (HOSPITAL_COMMUNITY)
Admission: RE | Admit: 2023-02-26 | Discharge: 2023-02-26 | Disposition: A | Discharge status: Home / Self Care | Payer: Commercial Managed Care - PPO | Source: Ambulatory Visit | Attending: Internal Medicine | Admitting: Internal Medicine

## 2023-02-26 DIAGNOSIS — L98499 Non-pressure chronic ulcer of skin of other sites with unspecified severity: Secondary | ICD-10-CM | POA: Diagnosis present

## 2023-02-26 DIAGNOSIS — E11622 Type 2 diabetes mellitus with other skin ulcer: Secondary | ICD-10-CM | POA: Insufficient documentation

## 2023-02-26 DIAGNOSIS — L97322 Non-pressure chronic ulcer of left ankle with fat layer exposed: Secondary | ICD-10-CM | POA: Insufficient documentation

## 2023-03-01 ENCOUNTER — Ambulatory Visit: Payer: Commercial Managed Care - PPO

## 2023-03-02 ENCOUNTER — Encounter (HOSPITAL_BASED_OUTPATIENT_CLINIC_OR_DEPARTMENT_OTHER): Payer: Commercial Managed Care - PPO | Admitting: General Surgery

## 2023-03-02 ENCOUNTER — Ambulatory Visit: Payer: Commercial Managed Care - PPO

## 2023-03-02 DIAGNOSIS — E11621 Type 2 diabetes mellitus with foot ulcer: Secondary | ICD-10-CM | POA: Diagnosis not present
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
## 2023-03-03 NOTE — Progress Notes (Signed)
DELOS, BEYLER ([id number]) [id number] Page 1 of 7 Visit Report for 03/02/2023 Arrival Information Details Patient Name: Date of Service: [name] [name] 03/02/2023 8:15 A M Medical Record Number: [id number] Patient Account Number: [id number] Date of Birth/Sex: Treating RN: [birth year] (62 y.o. Waldron Session Primary Care Shanekia Latella: Leonard Downing Other Clinician: Referring Yianni Skilling: Treating Muhamad Serano/Extender: Alda Lea in Treatment: 2 Visit Information History Since Last Visit Added or deleted any medications: No Patient Arrived: Wheel Chair Any new allergies or adverse reactions: No Arrival Time: 08:26 Had a fall or experienced change in No Accompanied By: wife activities of daily living that may affect Transfer Assistance: Manual risk of falls: Patient Identification Verified: Yes Signs or symptoms of abuse/neglect since last visito No Secondary Verification Process Completed: Yes Hospitalized since last visit: No Patient Requires Transmission-Based Precautions: No Implantable device outside of the clinic excluding No Patient Has Alerts: No cellular tissue based products placed in the center since last visit: Has Dressing in Place as Prescribed: Yes Pain Present Now: Yes Electronic Signature(s) Signed: 03/02/2023 4:08:25 PM By: Blanche East RN Entered By: Blanche East on 03/02/2023 08:26:57 -------------------------------------------------------------------------------- Compression Therapy Details Patient Name: Date of Service: [name] [name] 03/02/2023 8:15 A M Medical Record Number: [id number] Patient Account Number: [id number] Date of Birth/Sex: Treating RN: 25-Feb-1961 (62 y.o. Waldron Session Primary Care Christo Hain: Leonard Downing Other Clinician: Referring Conn Trombetta: Treating Terran Hollenkamp/Extender: Ulice Brilliant Weeks in Treatment: 2 Compression Therapy  Performed for Wound Assessment: Wound #1 Left,Lateral Ankle Performed By: Clinician Blanche East, RN Compression Type: Three Layer Post Procedure Diagnosis Same as Pre-procedure Notes urgo lite Electronic Signature(s) Signed: 03/02/2023 4:08:25 PM By: Blanche East RN Entered By: Blanche East on 03/02/2023 08:51:57 -------------------------------------------------------------------------------- Encounter Discharge Information Details Patient Name: Date of Service: [name] [name], [name] 03/02/2023 8:15 A M Medical Record Number: [id number] Patient Account Number: [id number] Date of Birth/Sex: Treating RN: 06-04-1961 (62 y.o. Waldron Session Primary Care Daniil Labarge: Leonard Downing Other Clinician: Referring Eastin Swing: Treating Markee Remlinger/Extender: Alda Lea in Treatment: 2 Encounter Discharge Information Items Discharge Condition: Stable Ambulatory Status: Wheelchair [name], [name] ([id number]) [id number] Page 2 of 7 Discharge Destination: Home Transportation: Private Auto Accompanied By: wife Schedule Follow-up Appointment: Yes Clinical Summary of Care: Electronic Signature(s) Signed: 03/02/2023 10:18:36 AM By: Blanche East RN Entered By: Blanche East on 03/02/2023 10:18:36 -------------------------------------------------------------------------------- Lower Extremity Assessment Details Patient Name: Date of Service: [name] [name] 03/02/2023 8:15 A M Medical Record Number: [id number] Patient Account Number: [id number] Date of Birth/Sex: Treating RN: 04-25-1961 (62 y.o. Waldron Session Primary Care Kiandre Spagnolo: Leonard Downing Other Clinician: Referring Yina Riviere: Treating Annaleigha Woo/Extender: Ulice Brilliant Weeks in Treatment: 2 Edema Assessment Assessed: [Left: No] [Right: No] [Left: Edema] [Right: :] Calf Left: Right: Point of Measurement: From Medial Instep 37  cm Ankle Left: Right: Point of Measurement: From Medial Instep 36 cm Vascular Assessment Pulses: Dorsalis Pedis Palpable: [Left:Yes] Electronic Signature(s) Signed: 03/02/2023 4:08:25 PM By: Blanche East RN Entered By: Blanche East on 03/02/2023 08:29:00 -------------------------------------------------------------------------------- Multi Wound Chart Details Patient Name: Date of Service: [name] [name] 03/02/2023 8:15 A M Medical Record Number: [id number] Patient Account Number: [id number] Date of Birth/Sex: Treating RN: 09/30/1961 (62 y.o. M) Primary Care Nesta Kimple: Leonard Downing Other Clinician: Referring Ambriana Selway: Treating Amarise Lillo/Extender: Ulice Brilliant Weeks in Treatment: 2 Vital Signs Height(in): 70 Pulse(bpm): 111 Weight(lbs): 196 Blood Pressure(mmHg): 136/87 Body Mass Index(BMI): 28.1 Temperature(F): 98.0  Respiratory Rate(breaths/min): 18 [1:Photos:] [N/A:N/A] Left, Lateral Ankle N/A N/A Wound Location: Shear/Friction N/A N/A Wounding Event: Diabetic Wound/Ulcer of the Lower N/A N/A Primary Etiology: Extremity Abscess N/A N/A Secondary Etiology: Anemia, Hypertension, Type II N/A N/A Comorbid History: Diabetes, Neuropathy 12/16/2019 N/A N/A Date Acquired: 2 N/A N/A Weeks of Treatment: Open N/A N/A Wound Status: No N/A N/A Wound Recurrence: 0.6x0.5x2.2 N/A N/A Measurements L x W x D (cm) 0.236 N/A N/A A (cm) : rea 0.518 N/A N/A Volume (cm) : -20.40% N/A N/A % Reduction in A rea: -339.00% N/A N/A % Reduction in Volume: Grade 1 N/A N/A Classification: Medium N/A N/A Exudate A mount: Purulent N/A N/A Exudate Type: yellow, brown, green N/A N/A Exudate Color: Large (67-100%) N/A N/A Granulation A mount: Pink N/A N/A Granulation Quality: Small (1-33%) N/A N/A Necrotic A mount: Fat Layer (Subcutaneous Tissue): Yes N/A N/A Exposed Structures: Fascia: No Tendon: No Muscle: No Bone: No Small (1-33%)  N/A N/A Epithelialization: Scarring: Yes N/A N/A Periwound Skin Texture: Maceration: No N/A N/A Periwound Skin Moisture: Dry/Scaly: No Rubor: Yes N/A N/A Periwound Skin Color: No Abnormality N/A N/A Temperature: Yes N/A N/A Tenderness on Palpation: Compression Therapy N/A N/A Procedures Performed: Treatment Notes Electronic Signature(s) Signed: 03/02/2023 10:08:39 AM By: Fredirick Maudlin MD FACS Entered By: Fredirick Maudlin on 03/02/2023 10:08:39 -------------------------------------------------------------------------------- Multi-Disciplinary Care Plan Details Patient Name: Date of Service: [name] [name] 03/02/2023 8:15 A M Medical Record Number: [id number] Patient Account Number: [id number] Date of Birth/Sex: Treating RN: 04/09/1961 (62 y.o. Waldron Session Primary Care Bairon Klemann: Leonard Downing Other Clinician: Referring Bary Limbach: Treating Corisa Montini/Extender: Ulice Brilliant Weeks in Treatment: 2 Active Inactive Nutrition Nursing Diagnoses: Potential for alteratiion in Nutrition/Potential for imbalanced nutrition Goals: Patient/caregiver verbalizes understanding of need to maintain therapeutic glucose control per primary care physician Date Initiated: 02/12/2023 Target Resolution Date: 03/19/2023 Goal Status: Active Interventions: Provide education on elevated blood sugars and impact on wound healing Treatment Activities: Dietary management education, guidance and counseling : 02/12/2023 MATT, ENGEN ([id number]) [id number] Page 4 of 7 Notes: Venous Leg Ulcer Nursing Diagnoses: Knowledge deficit related to disease process and management Goals: Patient will maintain optimal edema control Date Initiated: 02/12/2023 Target Resolution Date: 04/23/2023 Goal Status: Active Interventions: Compression as ordered Notes: Wound/Skin Impairment Nursing Diagnoses: Knowledge deficit related to ulceration/compromised  skin integrity Goals: Ulcer/skin breakdown will have a volume reduction of 30% by week 4 Date Initiated: 02/12/2023 Target Resolution Date: 03/26/2023 Goal Status: Active Interventions: Assess ulceration(s) every visit Provide education on ulcer and skin care Treatment Activities: Skin care regimen initiated : 02/12/2023 Topical wound management initiated : 02/12/2023 Notes: Electronic Signature(s) Signed: 03/02/2023 4:08:25 PM By: Blanche East RN Entered By: Blanche East on 03/02/2023 08:35:18 -------------------------------------------------------------------------------- Pain Assessment Details Patient Name: Date of Service: [name] [name] 03/02/2023 8:15 A M Medical Record Number: [id number] Patient Account Number: [id number] Date of Birth/Sex: Treating RN: 1961-05-21 (62 y.o. Waldron Session Primary Care Esteban Kobashigawa: Leonard Downing Other Clinician: Referring Maanvi Lecompte: Treating Makale Pindell/Extender: Ulice Brilliant Weeks in Treatment: 2 Active Problems Location of Pain Severity and Description of Pain Patient Has Paino No Site Locations Rate the pain. Current Pain Level: 0 Worst Pain Level: [address], [place] ([id number]) [id number] Page 5 of 7 Character of Pain Describe the Pain: Aching Pain Management and Medication Current Pain Management: Electronic Signature(s) Signed: 03/02/2023 4:08:25 PM By: Blanche East RN Entered By: Blanche East on 03/02/2023 08:28:03 -------------------------------------------------------------------------------- Patient/Caregiver Education Details Patient Name: Date of Service: Britta Mccreedy  D, [name] 3/22/2024andnbsp8:15 A M Medical Record Number: [id number] Patient Account Number: [id number] Date of Birth/Gender: Treating RN: [birth year] (62 y.o. Waldron Session Primary Care Physician: Leonard Downing Other Clinician: Referring Physician: Treating Physician/Extender: Alda Lea in Treatment: 2 Education Assessment Education Provided To: Patient Education Topics Provided Wound Debridement: Methods: Explain/Verbal Responses: Reinforcements needed, State content correctly Wound/Skin Impairment: Methods: Explain/Verbal Responses: Reinforcements needed, State content correctly Electronic Signature(s) Signed: 03/02/2023 4:08:25 PM By: Blanche East RN Entered By: Blanche East on 03/02/2023 08:35:47 -------------------------------------------------------------------------------- Wound Assessment Details Patient Name: Date of Service: [name] [name] 03/02/2023 8:15 A M Medical Record Number: [id number] Patient Account Number: [id number] Date of Birth/Sex: Treating RN: 1961-07-25 (62 y.o. Waldron Session Primary Care Julie Nay: Leonard Downing Other Clinician: Referring Naila Elizondo: Treating Solita Macadam/Extender: Ulice Brilliant Weeks in Treatment: 2 Wound Status Wound Number: 1 Primary Etiology: Diabetic Wound/Ulcer of the Lower Extremity Wound Location: Left, Lateral Ankle Secondary Etiology: Abscess Wounding Event: Shear/Friction Wound Status: Open Date Acquired: 12/16/2019 Comorbid History: Anemia, Hypertension, Type II Diabetes, Neuropathy Weeks Of Treatment: 2 Clustered Wound: No Photos RINO, TWILLEY ([id number]) [id number] Page 6 of 7 Wound Measurements Length: (cm) 0.6 Width: (cm) 0.5 Depth: (cm) 2.2 Area: (cm) 0.236 Volume: (cm) 0.518 % Reduction in Area: -20.4% % Reduction in Volume: -339% Epithelialization: Small (1-33%) Tunneling: No Undermining: No Wound Description Classification: Grade 1 Exudate Amount: Medium Exudate Type: Purulent Exudate Color: yellow, brown, green Foul Odor After Cleansing: No Slough/Fibrino Yes Wound Bed Granulation Amount: Large (67-100%) Exposed Structure Granulation Quality: Pink Fascia Exposed: No Necrotic  Amount: Small (1-33%) Fat Layer (Subcutaneous Tissue) Exposed: Yes Necrotic Quality: Adherent Slough Tendon Exposed: No Muscle Exposed: No Bone Exposed: No Periwound Skin Texture Texture Color No Abnormalities Noted: No No Abnormalities Noted: No Scarring: Yes Rubor: Yes Moisture Temperature / Pain No Abnormalities Noted: No Temperature: No Abnormality Dry / Scaly: No Tenderness on Palpation: Yes Maceration: No Treatment Notes Wound #1 (Ankle) Wound Laterality: Left, Lateral Cleanser Soap and Water Discharge Instruction: May shower and wash wound with dial antibacterial soap and water prior to dressing change. Wound Cleanser Discharge Instruction: Cleanse the wound with wound cleanser prior to applying a clean dressing using gauze sponges, not tissue or cotton balls. [facility] Ancillary Kit - 15 Day Supply Discharge Instruction: Use supplies as instructed; Kit contains: (15) Saline Bullets; (15) 3x3 Gauze; 15 pr Gloves Peri-Wound Care Topical Gentamicin Discharge Instruction: As directed by physician Primary Dressing Plain packing strip 1/4 (in) Discharge Instruction: Lightly pack as instructed Secondary Dressing Zetuvit Plus 4x8 in Discharge Instruction: Apply over primary dressing as directed. Secured With Compression Wrap ThreePress (3 layer compression wrap) [name] ([id number]) [id number] Page 7 of 7 Discharge Instruction: Apply three layer compression as directed. Compression Stockings Add-Ons Gloves, Medium Electronic Signature(s) Signed: 03/02/2023 4:08:25 PM By: Blanche East RN Entered By: Blanche East on 03/02/2023 08:33:54 -------------------------------------------------------------------------------- Vitals Details Patient Name: Date of Service: [name] [name], [name] 03/02/2023 8:15 A M Medical Record Number: [id number] Patient Account Number: [id number] Date of Birth/Sex: Treating RN: [birth year] (62 y.o. Waldron Session Primary Care Phelix Fudala: Leonard Downing Other Clinician: Referring Brittnae Aschenbrenner: Treating Cherly Erno/Extender: Ulice Brilliant Weeks in Treatment: 2 Vital Signs Time Taken: 08:27 Temperature (F): 98.0 Height (in): 70 Pulse (bpm): 111 Weight (lbs): 196 Respiratory Rate (breaths/min): 18 Body Mass Index (BMI): 28.1 Blood Pressure (mmHg): 136/87 Reference Range: 80 - 120 mg / dl Electronic Signature(s) Signed: 03/02/2023 4:08:25 PM  By: Blanche East RN Entered By: Blanche East on 03/02/2023 08:27:21

## 2023-03-05 ENCOUNTER — Ambulatory Visit: Payer: Commercial Managed Care - PPO

## 2023-03-05 DIAGNOSIS — R2689 Other abnormalities of gait and mobility: Secondary | ICD-10-CM

## 2023-03-05 DIAGNOSIS — R2681 Unsteadiness on feet: Secondary | ICD-10-CM | POA: Diagnosis present

## 2023-03-05 DIAGNOSIS — M6281 Muscle weakness (generalized): Secondary | ICD-10-CM | POA: Diagnosis present
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
## 2023-03-07 ENCOUNTER — Ambulatory Visit: Payer: Commercial Managed Care - PPO | Admitting: Physical Therapy

## 2023-03-07 DIAGNOSIS — M6281 Muscle weakness (generalized): Secondary | ICD-10-CM

## 2023-03-07 DIAGNOSIS — R2681 Unsteadiness on feet: Secondary | ICD-10-CM

## 2023-03-07 DIAGNOSIS — R2689 Other abnormalities of gait and mobility: Secondary | ICD-10-CM
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
## 2023-03-08 ENCOUNTER — Encounter (HOSPITAL_BASED_OUTPATIENT_CLINIC_OR_DEPARTMENT_OTHER): Payer: Commercial Managed Care - PPO | Admitting: General Surgery

## 2023-03-08 ENCOUNTER — Ambulatory Visit: Payer: Commercial Managed Care - PPO | Admitting: Physical Therapy

## 2023-03-08 DIAGNOSIS — E11621 Type 2 diabetes mellitus with foot ulcer: Secondary | ICD-10-CM | POA: Diagnosis not present
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
## 2023-03-08 NOTE — Progress Notes (Signed)
HEATH, PENDERS ([id number]) [id number] Page 1 of 7 Visit Report for 03/08/2023 Arrival Information Details Patient Name: Date of Service: [name] [name] 03/08/2023 2:45 PM Medical Record Number: [id number] Patient Account Number: [id number] Date of Birth/Sex: Treating RN: [birth year] (62 y.o. Ernestene Mention Primary Care Jalayla Chrismer: Leonard Downing Other Clinician: Referring Belvia Gotschall: Treating Deseree Zemaitis/Extender: Alda Lea in Treatment: 3 Visit Information History Since Last Visit Added or deleted any medications: No Patient Arrived: Wheel Chair Any new allergies or adverse reactions: No Arrival Time: 15:00 Had a fall or experienced change in No Accompanied By: mother activities of daily living that may affect Transfer Assistance: None risk of falls: Patient Identification Verified: Yes Signs or symptoms of abuse/neglect since last visito No Secondary Verification Process Completed: Yes Hospitalized since last visit: No Patient Requires Transmission-Based Precautions: No Implantable device outside of the clinic excluding No Patient Has Alerts: No cellular tissue based products placed in the center since last visit: Has Dressing in Place as Prescribed: Yes Pain Present Now: Yes Electronic Signature(s) Signed: 03/08/2023 3:57:42 PM By: Baruch Gouty RN, BSN Entered By: Baruch Gouty on 03/08/2023 15:01:41 -------------------------------------------------------------------------------- Compression Therapy Details Patient Name: Date of Service: [name] [name] 03/08/2023 2:45 PM Medical Record Number: [id number] Patient Account Number: [id number] Date of Birth/Sex: Treating RN: [birth year] (62 y.o. Ernestene Mention Primary Care Lavanna Rog: Leonard Downing Other Clinician: Referring Jory Tanguma: Treating Dymphna Wadley/Extender: Ulice Brilliant Weeks in Treatment: 3 Compression  Therapy Performed for Wound Assessment: Wound #1 Left,Lateral Ankle Performed By: Clinician Baruch Gouty, RN Compression Type: Three Layer Post Procedure Diagnosis Same as Pre-procedure Electronic Signature(s) Signed: 03/08/2023 3:57:42 PM By: Baruch Gouty RN, BSN Entered By: Baruch Gouty on 03/08/2023 15:36:52 -------------------------------------------------------------------------------- Encounter Discharge Information Details Patient Name: Date of Service: [name] [name] 03/08/2023 2:45 PM Medical Record Number: [id number] Patient Account Number: [id number] Date of Birth/Sex: Treating RN: Dec 19, 1960 (62 y.o. Ernestene Mention Primary Care Ch[place]i Goh: Leonard Downing Other Clinician: Referring Caren Garske: Treating Aster Screws/Extender: Alda Lea in Treatment: 3 Encounter Discharge Information Items Discharge Condition: Stable Ambulatory Status: Wheelchair Discharge Destination: Home Transportation: Private Auto Accompanied By: mother [name] [name], [name] [name] ([id number]) [id number] Page 2 of 7 Schedule Follow-up Appointment: Yes Clinical Summary of Care: Patient Declined Electronic Signature(s) Signed: 03/08/2023 3:57:42 PM By: Baruch Gouty RN, BSN Entered By: Baruch Gouty on 03/08/2023 15:56:49 -------------------------------------------------------------------------------- Lower Extremity Assessment Details Patient Name: Date of Service: [name] [name] 03/08/2023 2:45 PM Medical Record Number: [id number] Patient Account Number: [id number] Date of Birth/Sex: Treating RN: 11-09-1961 (62 y.o. Ernestene Mention Primary Care Lindsay Straka: Leonard Downing Other Clinician: Referring Sandford Diop: Treating Williemae Muriel/Extender: Ulice Brilliant Weeks in Treatment: 3 Edema Assessment Assessed: [Left: No] [Right: No] Edema: [Left: Ye] [Right: s] Calf Left: Right: Point of Measurement:  From Medial Instep 33.5 cm Ankle Left: Right: Point of Measurement: From Medial Instep 29 cm Vascular Assessment Pulses: Dorsalis Pedis Palpable: [Left:Yes] Electronic Signature(s) Signed: 03/08/2023 3:57:42 PM By: Baruch Gouty RN, BSN Entered By: Baruch Gouty on 03/08/2023 15:11:23 -------------------------------------------------------------------------------- Multi Wound Chart Details Patient Name: Date of Service: [name] [name] 03/08/2023 2:45 PM Medical Record Number: [id number] Patient Account Number: [id number] Date of Birth/Sex: Treating RN: March 13, 1961 (62 y.o. M) Primary Care Linsy Ehresman: Leonard Downing Other Clinician: Referring Bedie Dominey: Treating Chele Cornell/Extender: Ulice Brilliant Weeks in Treatment: 3 Vital Signs Height(in): 70 Pulse(bpm): 101 Weight(lbs): 196 Blood Pressure(mmHg): 130/87 Body Mass Index(BMI): 28.1 Temperature(F): 98.3 Respiratory  Rate(breaths/min): 18 [1:Photos:] [N/A:N/A] Left, Lateral Ankle N/A N/A Wound Location: Shear/Friction N/A N/A Wounding Event: Diabetic Wound/Ulcer of the Lower N/A N/A Primary Etiology: Extremity Abscess N/A N/A Secondary Etiology: Anemia, Hypertension, Type II N/A N/A Comorbid History: Diabetes, Neuropathy 12/16/2019 N/A N/A Date Acquired: 3 N/A N/A Weeks of Treatment: Open N/A N/A Wound Status: No N/A N/A Wound Recurrence: 0.5x0.4x1.3 N/A N/A Measurements L x W x D (cm) 0.157 N/A N/A A (cm) : rea 0.204 N/A N/A Volume (cm) : 19.90% N/A N/A % Reduction in A rea: -72.90% N/A N/A % Reduction in Volume: 9 Position 1 (o'clock): 0.7 Maximum Distance 1 (cm): Yes N/A N/A Tunneling: Grade 2 N/A N/A Classification: Medium N/A N/A Exudate A mount: Serosanguineous N/A N/A Exudate Type: red, brown N/A N/A Exudate Color: Well defined, not attached N/A N/A Wound Margin: Large (67-100%) N/A N/A Granulation A mount: Red N/A N/A Granulation Quality: None  Present (0%) N/A N/A Necrotic A mount: Fat Layer (Subcutaneous Tissue): Yes N/A N/A Exposed Structures: Fascia: No Tendon: No Muscle: No Bone: No Small (1-33%) N/A N/A Epithelialization: Scarring: Yes N/A N/A Periwound Skin Texture: Maceration: No N/A N/A Periwound Skin Moisture: Dry/Scaly: No Rubor: No N/A N/A Periwound Skin Color: No Abnormality N/A N/A Temperature: Yes N/A N/A Tenderness on Palpation: Compression Therapy N/A N/A Procedures Performed: Treatment Notes Electronic Signature(s) Signed: 03/08/2023 3:43:19 PM By: Fredirick Maudlin MD FACS Entered By: Fredirick Maudlin on 03/08/2023 15:43:19 -------------------------------------------------------------------------------- Multi-Disciplinary Care Plan Details Patient Name: Date of Service: [name] [name] 03/08/2023 2:45 PM Medical Record Number: [id number] Patient Account Number: [id number] Date of Birth/Sex: Treating RN: Nov 29, 1961 (62 y.o. Ernestene Mention Primary Care Rilda Bulls: Leonard Downing Other Clinician: Referring Nishawn Rotan: Treating Aliannah Holstrom/Extender: Ulice Brilliant Weeks in Treatment: 3 Active Inactive Nutrition Nursing Diagnoses: Potential for alteratiion in Nutrition/Potential for imbalanced nutrition Goals: Patient/caregiver verbalizes understanding of need to maintain therapeutic glucose control per primary care physician Date Initiated: 02/12/2023 Target Resolution Date: 03/19/2023 Goal Status: Active Interventions: Provide education on elevated blood sugars and impact on wound healing Treatment Activities: Dietary management education, guidance and counseling : 02/12/2023 [name] [name], [name] [name] ([id number]) [id number] Page 4 of 7 Notes: Venous Leg Ulcer Nursing Diagnoses: Knowledge deficit related to disease process and management Goals: Patient will maintain optimal edema control Date Initiated: 02/12/2023 Target Resolution Date:  04/23/2023 Goal Status: Active Interventions: Compression as ordered Notes: Wound/Skin Impairment Nursing Diagnoses: Knowledge deficit related to ulceration/compromised skin integrity Goals: Ulcer/skin breakdown will have a volume reduction of 30% by week 4 Date Initiated: 02/12/2023 Target Resolution Date: 03/26/2023 Goal Status: Active Interventions: Assess ulceration(s) every visit Provide education on ulcer and skin care Treatment Activities: Skin care regimen initiated : 02/12/2023 Topical wound management initiated : 02/12/2023 Notes: Electronic Signature(s) Signed: 03/08/2023 3:57:42 PM By: Baruch Gouty RN, BSN Entered By: Baruch Gouty on 03/08/2023 15:19:15 -------------------------------------------------------------------------------- Pain Assessment Details Patient Name: Date of Service: [name] [name] 03/08/2023 2:45 PM Medical Record Number: [id number] Patient Account Number: [id number] Date of Birth/Sex: Treating RN: 04/17/1961 (62 y.o. Ernestene Mention Primary Care Bertil Brickey: Leonard Downing Other Clinician: Referring Harlem Bula: Treating Prisilla Kocsis/Extender: Ulice Brilliant Weeks in Treatment: 3 Active Problems Location of Pain Severity and Description of Pain Patient Has Paino Yes Site Locations Pain Location: Pain in Ulcers With Dressing Change: Yes Duration of the Pain. Constant / Intermittento Intermittent Rate the pain. Current Pain Level: 3 Worst Pain Level: 5 Least Pain Level: 0 [name] [name], [name] [name] ([id number]) [id number] Page 5 of 7 Character  of Pain Describe the Pain: Sharp, Throbbing Pain Management and Medication Current Pain Management: Medication: Yes Other: time Is the Current Pain Management Adequate: Adequate How does your wound impact your activities of daily livingo Sleep: Yes Bathing: No Appetite: No Relationship With Others: No Bladder Continence: No Emotions: No Bowel  Continence: No Work: No Toileting: No Drive: No Dressing: No Hobbies: Astronomer) Signed: 03/08/2023 3:57:42 PM By: Baruch Gouty RN, BSN Entered By: Baruch Gouty on 03/08/2023 15:05:08 -------------------------------------------------------------------------------- Patient/Caregiver Education Details Patient Name: Date of Service: [name] [name] 3/28/2024andnbsp2:45 PM Medical Record Number: [id number] Patient Account Number: [id number] Date of Birth/Gender: Treating RN: 01-22-1961 (62 y.o. Ernestene Mention Primary Care Physician: Leonard Downing Other Clinician: Referring Physician: Treating Physician/Extender: Alda Lea in Treatment: 3 Education Assessment Education Provided To: Patient Education Topics Provided Wound/Skin Impairment: Methods: Explain/Verbal Responses: Reinforcements needed, State content correctly Electronic Signature(s) Signed: 03/08/2023 3:57:42 PM By: Baruch Gouty RN, BSN Entered By: Baruch Gouty on 03/08/2023 15:19:34 -------------------------------------------------------------------------------- Wound Assessment Details Patient Name: Date of Service: [name] [name] 03/08/2023 2:45 PM Medical Record Number: [id number] Patient Account Number: [id number] Date of Birth/Sex: Treating RN: 1961/01/02 (62 y.o. Ernestene Mention Primary Care Rick Warnick: Leonard Downing Other Clinician: Referring Nickcole Bralley: Treating Ashante Yellin/Extender: Ulice Brilliant Weeks in Treatment: 3 Wound Status Wound Number: 1 Primary Etiology: Diabetic Wound/Ulcer of the Lower Extremity Wound Location: Left, Lateral Ankle Secondary Etiology: Abscess Wounding Event: Shear/Friction Wound Status: Open Date Acquired: 12/16/2019 Comorbid History: Anemia, Hypertension, Type II Diabetes, Neuropathy Weeks Of Treatment: 3 Clustered Wound: No Photos [name] [name], [name] [name] ([id number])  [id number] Page 6 of 7 Wound Measurements Length: (cm) 0.5 Width: (cm) 0.4 Depth: (cm) 1.3 Area: (cm) 0.157 Volume: (cm) 0.204 % Reduction in Area: 19.9% % Reduction in Volume: -72.9% Epithelialization: Small (1-33%) Tunneling: Yes Position (o'clock): 9 Maximum Distance: (cm) 0.7 Undermining: No Wound Description Classification: Grade 2 Wound Margin: Well defined, not attached Exudate Amount: Medium Exudate Type: Serosanguineous Exudate Color: red, brown Foul Odor After Cleansing: No Slough/Fibrino Yes Wound Bed Granulation Amount: Large (67-100%) Exposed Structure Granulation Quality: Red Fascia Exposed: No Necrotic Amount: None Present (0%) Fat Layer (Subcutaneous Tissue) Exposed: Yes Tendon Exposed: No Muscle Exposed: No Bone Exposed: No Periwound Skin Texture Texture Color No Abnormalities Noted: No No Abnormalities Noted: Yes Scarring: Yes Temperature / Pain Temperature: No Abnormality Moisture No Abnormalities Noted: No Tenderness on Palpation: Yes Dry / Scaly: No Maceration: No Treatment Notes Wound #1 (Ankle) Wound Laterality: Left, Lateral Cleanser Soap and Water Discharge Instruction: May shower and wash wound with dial antibacterial soap and water prior to dressing change. Wound Cleanser Discharge Instruction: Cleanse the wound with wound cleanser prior to applying a clean dressing using gauze sponges, not tissue or cotton balls. [place] Ancillary Kit - 15 Day Supply Discharge Instruction: Use supplies as instructed; Kit contains: (15) Saline Bullets; (15) 3x3 Gauze; 15 pr Gloves Peri-Wound Care Topical Gentamicin Discharge Instruction: As directed by physician Primary Dressing Plain packing strip 1/4 (in) Discharge Instruction: Lightly pack as instructed Secondary Dressing Woven Gauze Sponge, Non-Sterile 4x4 in Discharge Instruction: Apply over primary dressing as directed. [name] [name], [name] [name] ([id number])  [id number] Page 7 of 7 Secured With Compression Wrap ThreePress (3 layer compression wrap) Discharge Instruction: Apply three layer compression as directed. Compression Stockings Add-Ons Gloves, Medium Electronic Signature(s) Signed: 03/08/2023 3:57:42 PM By: Baruch Gouty RN, BSN Entered By: Baruch Gouty on 03/08/2023 15:17:57 -------------------------------------------------------------------------------- Vitals Details Patient Name: Date of Service: [name] [name],  [name] 03/08/2023 2:45 PM Medical Record Number: [id number] Patient Account Number: [id number] Date of Birth/Sex: Treating RN: [birth year] (62 y.o. Ernestene Mention Primary Care Yasmene Salomone: Leonard Downing Other Clinician: Referring Kermitt Harjo: Treating Ellie Spickler/Extender: Ulice Brilliant Weeks in Treatment: 3 Vital Signs Time Taken: 15:03 Temperature (F): 98.3 Height (in): 70 Pulse (bpm): 101 Weight (lbs): 196 Respiratory Rate (breaths/min): 18 Body Mass Index (BMI): 28.1 Blood Pressure (mmHg): 130/87 Reference Range: 80 - 120 mg / dl Notes pt ran out of strips and has not checked blood sugars x1 week Electronic Signature(s) Signed: 03/08/2023 3:57:42 PM By: Baruch Gouty RN, BSN Entered By: Baruch Gouty on 03/08/2023 15:04:00

## 2023-03-12 ENCOUNTER — Ambulatory Visit: Payer: Commercial Managed Care - PPO

## 2023-03-12 DIAGNOSIS — M6281 Muscle weakness (generalized): Secondary | ICD-10-CM | POA: Insufficient documentation

## 2023-03-12 DIAGNOSIS — R2689 Other abnormalities of gait and mobility: Secondary | ICD-10-CM

## 2023-03-12 DIAGNOSIS — M25572 Pain in left ankle and joints of left foot: Secondary | ICD-10-CM | POA: Insufficient documentation

## 2023-03-12 DIAGNOSIS — R2681 Unsteadiness on feet: Secondary | ICD-10-CM
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
## 2023-03-13 ENCOUNTER — Encounter (HOSPITAL_BASED_OUTPATIENT_CLINIC_OR_DEPARTMENT_OTHER): Payer: Commercial Managed Care - PPO | Attending: General Surgery | Admitting: General Surgery

## 2023-03-13 DIAGNOSIS — E11621 Type 2 diabetes mellitus with foot ulcer: Secondary | ICD-10-CM | POA: Insufficient documentation

## 2023-03-13 DIAGNOSIS — M869 Osteomyelitis, unspecified: Secondary | ICD-10-CM | POA: Insufficient documentation

## 2023-03-13 DIAGNOSIS — Z8249 Family history of ischemic heart disease and other diseases of the circulatory system: Secondary | ICD-10-CM | POA: Insufficient documentation

## 2023-03-13 DIAGNOSIS — Z87891 Personal history of nicotine dependence: Secondary | ICD-10-CM | POA: Insufficient documentation

## 2023-03-13 DIAGNOSIS — E1140 Type 2 diabetes mellitus with diabetic neuropathy, unspecified: Secondary | ICD-10-CM | POA: Insufficient documentation

## 2023-03-13 DIAGNOSIS — E1169 Type 2 diabetes mellitus with other specified complication: Secondary | ICD-10-CM | POA: Insufficient documentation

## 2023-03-13 DIAGNOSIS — I10 Essential (primary) hypertension: Secondary | ICD-10-CM | POA: Diagnosis not present

## 2023-03-13 DIAGNOSIS — T84197S Other mechanical complication of internal fixation device of bone of left lower leg, sequela: Secondary | ICD-10-CM | POA: Diagnosis not present

## 2023-03-13 DIAGNOSIS — L97322 Non-pressure chronic ulcer of left ankle with fat layer exposed: Secondary | ICD-10-CM | POA: Insufficient documentation
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
## 2023-03-13 NOTE — Progress Notes (Signed)
[name], [name] ([id number]) [id number] Page 1 of 7 Visit Report for 03/13/2023 Arrival Information Details Patient Name: Date of Service: [name] [name] 03/13/2023 10:45 A M Medical Record Number: [id number] Patient Account Number: [id number] Date of Birth/Sex: Treating RN: [name] [name] (62 y.o. M) Primary Care [name] [name]: [name] [name] Other Clinician: Referring [name] [name]: Treating [name] [name]/Extender: [name] [name] in Treatment: 4 Visit Information History Since Last Visit Added or deleted any medications: No Patient Arrived: Wheel Chair Any new allergies or adverse reactions: No Arrival Time: 11:15 Had a fall or experienced change in No Accompanied By: Mom activities of daily living that [name] affect Transfer Assistance: Manual risk of falls: Patient Requires Transmission-Based Precautions: No Signs or symptoms of abuse/neglect since last visito No Patient Has Alerts: No Hospitalized since last visit: No Implantable device outside of the clinic excluding No cellular tissue based products placed in the center since last visit: Has Dressing in Place as Prescribed: Yes Pain Present Now: No Electronic Signature(s) Signed: 03/13/2023 1:02:24 PM By: [name] [name] Entered By: [name] [name] on 03/13/2023 11:15:32 -------------------------------------------------------------------------------- Compression Therapy Details Patient Name: Date of Service: [name] [name] 03/13/2023 10:45 A M Medical Record Number: [id number] Patient Account Number: [id number] Date of Birth/Sex: Treating RN: [birth year][name] (62 y.o. [name] [name] Primary Care Maygan Koeller: [name] [name] Other Clinician: Referring Lidia Clavijo: Treating Dellene Mcgroarty/Extender: [name] [name] Weeks in Treatment: 4 Compression Therapy Performed for Wound Assessment: Wound #1 Left,Lateral Ankle Performed By: Clinician [name] Peals, RN Compression Type: Three Layer Post Procedure Diagnosis Same as Pre-procedure Electronic Signature(s) Signed: 03/13/2023 3:18:09 PM By: [name] [name] Entered By: [name] [name] on 03/13/2023 11:29:35 -------------------------------------------------------------------------------- Encounter Discharge Information Details Patient Name: Date of Service: [name] [name], [name] [name] 03/13/2023 10:45 A M Medical Record Number: [id number] Patient Account Number: [id number] Date of Birth/Sex: Treating RN: [birth year] [name] (62 y.o. [name] [name] Primary Care Tonique Mendonca: [name] [name] Other Clinician: Referring Riyanshi Wahab: Treating [name] [name]/Extender: [name] [name] in Treatment: 4 Encounter Discharge Information Items Discharge Condition: Stable Ambulatory Status: Wheelchair Discharge Destination: Home Transportation: Private Auto Accompanied By: family [name] [name] ([id number]) [id number] Page 2 of 7 Schedule Follow-up Appointment: Yes Clinical Summary of Care: Patient Declined Electronic Signature(s) Signed: 03/13/2023 3:18:09 PM By: [name] [name] Entered By: [name] [name] on 03/13/2023 11:30:42 -------------------------------------------------------------------------------- Lower Extremity Assessment Details Patient Name: Date of Service: [name] [name] 03/13/2023 10:45 A M Medical Record Number: [id number] Patient Account Number: [id number] Date of Birth/Sex: Treating RN: [birth year][name] (62 y.o. [name] [name] Primary Care Kieran Arreguin: [name] [name] Other Clinician: Referring [name] [name]: Treating [name] [name]/Extender: [name] [name] Weeks in Treatment: 4 Edema Assessment Assessed: [Left: No] [Right: No] Edema: [Left: Ye] [Right: s] Calf Left: Right: Point of Measurement: From Medial Instep 34.5 cm Ankle Left: Right: Point of Measurement: From Medial Instep 29  cm Vascular Assessment Pulses: Dorsalis Pedis Palpable: [Left:Yes] Electronic Signature(s) Signed: 03/13/2023 3:18:09 PM By: [name] [name] Entered By: [name] [name] on 03/13/2023 11:21:18 -------------------------------------------------------------------------------- Multi Wound Chart Details Patient Name: Date of Service: [name] [name] 03/13/2023 10:45 A M Medical Record Number: [id number] Patient Account Number: [id number] Date of Birth/Sex: Treating RN: [birth year] (62 y.o. M) Primary Care Ayad Nieman: [name] [name] Other Clinician: Referring Latoi Giraldo: Treating Iniya Matzek/Extender: [name] [name] Weeks in Treatment: 4 Vital Signs Height(in): 70 Pulse(bpm): 103 Weight(lbs): 196 Blood Pressure(mmHg): 115/84 Body Mass Index(BMI): 28.1 Temperature(F): 99.1 Respiratory Rate(breaths/min): 18 [1:Photos: No Photos Left, Lateral Ankle Wound Location: Shear/Friction Wounding Event: Diabetic  Wound/Ulcer of the Lower Primary Etiology: Extremity Abscess Secondary Etiology: Anemia, Hypertension, Type II Comorbid History: Diabetes, Neuropathy] [N/A:N/A N/A N/A  N/A N/A N/A] [name] [name] ([id number]) [1:12/16/2019 Date Acquired: 4 Weeks of Treatment: Open Wound Status: No Wound Recurrence: 0.5x0.4x1.1 Measurements L x W x D (cm) 0.157 A (cm) : rea 0.173 Volume (cm) : 19.90% % Reduction in A rea: -46.60% % Reduction in Volume: Grade 2 Classification:  Medium Exudate A mount: Serosanguineous Exudate Type: red, brown Exudate Color: Well defined, not attached Wound Margin: Large (67-100%) Granulation A mount: Red Granulation Quality: None Present (0%) Necrotic A mount: Fat Layer (Subcutaneous Tissue):  Yes N/A Exposed Structures: Fascia: No Tendon: No Muscle: No Bone: No Small (1-33%) Epithelialization: Scarring: Yes Periwound Skin Texture: Maceration: No Periwound Skin Moisture: Dry/Scaly: No Rubor: No Periwound Skin Color: No Abnormality Temperature:  Yes  Tenderness on Palpation: Compression Therapy Procedures Performed:] [N/A:N/A N/A N/A N/A N/A N/A N/A N/A N/A N/A N/A N/A N/A N/A N/A N/A N/A N/A N/A N/A N/A N/A N/A N/A] Treatment Notes Wound #1 (Ankle) Wound Laterality: Left, Lateral Cleanser Soap and Water Discharge Instruction: [name] shower and wash wound with dial antibacterial soap and water prior to dressing change. Wound Cleanser Discharge Instruction: Cleanse the wound with wound cleanser prior to applying a clean dressing using gauze sponges, not tissue or cotton balls. Byram Ancillary Kit - 15 Day Supply Discharge Instruction: Use supplies as instructed; Kit contains: (15) Saline Bullets; (15) 3x3 Gauze; 15 pr Gloves Peri-Wound Care Topical Gentamicin Discharge Instruction: As directed by physician Primary Dressing Plain packing strip 1/4 (in) Discharge Instruction: Lightly pack as instructed Secondary Dressing Woven Gauze Sponge, Non-Sterile 4x4 in Discharge Instruction: Apply over primary dressing as directed. Secured With Compression Wrap ThreePress (3 layer compression wrap) Discharge Instruction: Apply three layer compression as directed. Compression Stockings Add-Ons Gloves, Medium Electronic Signature(s) Signed: 03/13/2023 12:15:29 PM By: Fredirick Maudlin MD FACS Entered By: Fredirick Maudlin on 03/13/2023 12:15:29 Multi-Disciplinary Care Plan Details -------------------------------------------------------------------------------- [name] [name] ([id number]) [id number] Page 4 of 7 Patient Name: Date of Service: [name] [name] 03/13/2023 10:45 A M Medical Record Number: [id number] Patient Account Number: [id number] Date of Birth/Sex: Treating RN: [birth year] (62 y.o. [name] [name] Primary Care Infant Zink: [name] [name] Other Clinician: Referring Phelicia Dantes: Treating Geraldene Eisel/Extender: [name] [name] Weeks in Treatment: 4 Active  Inactive Nutrition Nursing Diagnoses: Potential for alteratiion in Nutrition/Potential for imbalanced nutrition Goals: Patient/caregiver verbalizes understanding of need to maintain therapeutic glucose control per primary care physician Date Initiated: 02/12/2023 Target Resolution Date: 03/19/2023 Goal Status: Active Interventions: Provide education on elevated blood sugars and impact on wound healing Treatment Activities: Dietary management education, guidance and counseling : 02/12/2023 Notes: Venous Leg Ulcer Nursing Diagnoses: Knowledge deficit related to disease process and management Goals: Patient will maintain optimal edema control Date Initiated: 02/12/2023 Target Resolution Date: 04/23/2023 Goal Status: Active Interventions: Compression as ordered Notes: Wound/Skin Impairment Nursing Diagnoses: Knowledge deficit related to ulceration/compromised skin integrity Goals: Ulcer/skin breakdown will have a volume reduction of 30% by week 4 Date Initiated: 02/12/2023 Target Resolution Date: 03/26/2023 Goal Status: Active Interventions: Assess ulceration(s) every visit Provide education on ulcer and skin care Treatment Activities: Skin care regimen initiated : 02/12/2023 Topical wound management initiated : 02/12/2023 Notes: Electronic Signature(s) Signed: 03/13/2023 3:18:09 PM By: [name] [name] Entered By: [name] [name] on 03/13/2023 11:23:15 -------------------------------------------------------------------------------- Pain Assessment Details Patient Name: Date of Service: [name] [name] 03/13/2023 10:45 A M Medical Record Number: [id number] Patient Account Number: [id number]  Date of Birth/Sex: Treating RN: [birth year] [name] (62 y.o. M) Primary Care Rodolfo Notaro: [name] [name] Other Clinician: Verneita [name] ([id number]) [id number] Page 5 of 7 Referring [name] [name]: Treating Antavia Tandy/Extender: [name] [name] Weeks in  Treatment: 4 Active Problems Location of Pain Severity and Description of Pain Patient Has Paino No Site Locations Pain Management and Medication Current Pain Management: Electronic Signature(s) Signed: 03/13/2023 1:02:24 PM By: [name] [name] Entered By: [name] [name] on 03/13/2023 11:05:18 -------------------------------------------------------------------------------- Patient/Caregiver Education Details Patient Name: Date of Service: [name] [name], [name] [name] 4/2/2024andnbsp10:45 A M Medical Record Number: [id number] Patient Account Number: [id number] Date of Birth/Gender: Treating RN: [birth year] [name] (62 y.o. [name] [name] Primary Care Physician: [name] [name] Other Clinician: Referring Physician: Treating Physician/Extender: [name] [name] in Treatment: 4 Education Assessment Education Provided To: Patient Education Topics Provided Wound/Skin Impairment: Methods: Explain/Verbal Responses: Reinforcements needed, State content correctly Electronic Signature(s) Signed: 03/13/2023 3:18:09 PM By: [name] [name] Entered By: [name] [name] on 03/13/2023 11:23:27 -------------------------------------------------------------------------------- Wound Assessment Details Patient Name: Date of Service: [name] [name] 03/13/2023 10:45 A M Medical Record Number: [id number] Patient Account Number: [id number] Date of Birth/Sex: Treating RN: [birth year][name] (62 y.o. M) Primary Care Joleigh Mineau: [name] [name] Other Clinician: Referring Jobin Montelongo: Treating [name] [name]/Extender: [name] [name] in Treatment: 4 [place], [name] [name] ([id number]) [id number] Page 6 of 7 Wound Status Wound Number: 1 Primary Etiology: Diabetic Wound/Ulcer of the Lower Extremity Wound Location: Left, Lateral Ankle Secondary Etiology: Abscess Wounding Event: Shear/Friction Wound Status: Open Date Acquired:  12/16/2019 Comorbid History: Anemia, Hypertension, Type II Diabetes, Neuropathy Weeks Of Treatment: 4 Clustered Wound: No Wound Measurements Length: (cm) 0.5 Width: (cm) 0.4 Depth: (cm) 1.1 Area: (cm) 0.157 Volume: (cm) 0.173 % Reduction in Area: 19.9% % Reduction in Volume: -46.6% Epithelialization: Small (1-33%) Tunneling: No Undermining: No Wound Description Classification: Grade 2 Wound Margin: Well defined, not attached Exudate Amount: Medium Exudate Type: Serosanguineous Exudate Color: red, brown Foul Odor After Cleansing: No Slough/Fibrino Yes Wound Bed Granulation Amount: Large (67-100%) Exposed Structure Granulation Quality: Red Fascia Exposed: No Necrotic Amount: None Present (0%) Fat Layer (Subcutaneous Tissue) Exposed: Yes Tendon Exposed: No Muscle Exposed: No Bone Exposed: No Periwound Skin Texture Texture Color No Abnormalities Noted: No No Abnormalities Noted: Yes Scarring: Yes Temperature / Pain Temperature: No Abnormality Moisture No Abnormalities Noted: No Tenderness on Palpation: Yes Dry / Scaly: No Maceration: No Treatment Notes Wound #1 (Ankle) Wound Laterality: Left, Lateral Cleanser Soap and Water Discharge Instruction: [name] shower and wash wound with dial antibacterial soap and water prior to dressing change. Wound Cleanser Discharge Instruction: Cleanse the wound with wound cleanser prior to applying a clean dressing using gauze sponges, not tissue or cotton balls. Byram Ancillary Kit - 15 Day Supply Discharge Instruction: Use supplies as instructed; Kit contains: (15) Saline Bullets; (15) 3x3 Gauze; 15 pr Gloves Peri-Wound Care Topical Gentamicin Discharge Instruction: As directed by physician Primary Dressing Plain packing strip 1/4 (in) Discharge Instruction: Lightly pack as instructed Secondary Dressing Woven Gauze Sponge, Non-Sterile 4x4 in Discharge Instruction: Apply over primary dressing as directed. Secured  With Compression Wrap ThreePress (3 layer compression wrap) Discharge Instruction: Apply three layer compression as directed. Compression Stockings [name] [name], [name] [name] ([id number]) [id number] Page 7 of 7 Add-Ons Gloves, Medium Electronic Signature(s) Signed: 03/13/2023 3:18:09 PM By: [name] [name] Entered By: [name] [name] on 03/13/2023 11:21:38 -------------------------------------------------------------------------------- Vitals Details Patient Name: Date of Service: [name] [name], [name] [name] 03/13/2023 10:45 A M Medical Record Number: [id number] Patient Account Number: [id number] Date of  Birth/Sex: Treating RN: [name][birth year] (62 y.o. M) Primary Care Delilah Mulgrew: [name] [name] Other Clinician: Referring Leahanna Buser: Treating Emiliya Chretien/Extender: [name] [name] Weeks in Treatment: 4 Vital Signs Time Taken: 11:04 Temperature (F): 99.1 Height (in): 70 Pulse (bpm): 103 Weight (lbs): 196 Respiratory Rate (breaths/min): 18 Body Mass Index (BMI): 28.1 Blood Pressure (mmHg): 115/84 Reference Range: 80 - 120 mg / dl Electronic Signature(s) Signed: 03/13/2023 1:02:24 PM By: [name] [name] Entered By: [name] [name] on 03/13/2023 11:05:12

## 2023-03-14 ENCOUNTER — Ambulatory Visit: Payer: Commercial Managed Care - PPO

## 2023-03-15 ENCOUNTER — Ambulatory Visit: Payer: Commercial Managed Care - PPO | Admitting: Physical Therapy

## 2023-03-19 ENCOUNTER — Encounter (HOSPITAL_BASED_OUTPATIENT_CLINIC_OR_DEPARTMENT_OTHER): Payer: Commercial Managed Care - PPO | Admitting: General Surgery

## 2023-03-19 ENCOUNTER — Ambulatory Visit: Payer: Commercial Managed Care - PPO

## 2023-03-19 DIAGNOSIS — Z8249 Family history of ischemic heart disease and other diseases of the circulatory system: Secondary | ICD-10-CM | POA: Diagnosis not present

## 2023-03-19 DIAGNOSIS — E11621 Type 2 diabetes mellitus with foot ulcer: Secondary | ICD-10-CM | POA: Diagnosis present

## 2023-03-19 DIAGNOSIS — E1169 Type 2 diabetes mellitus with other specified complication: Secondary | ICD-10-CM | POA: Diagnosis not present

## 2023-03-19 DIAGNOSIS — M869 Osteomyelitis, unspecified: Secondary | ICD-10-CM | POA: Diagnosis not present

## 2023-03-19 DIAGNOSIS — Z87891 Personal history of nicotine dependence: Secondary | ICD-10-CM | POA: Diagnosis not present

## 2023-03-19 DIAGNOSIS — L97322 Non-pressure chronic ulcer of left ankle with fat layer exposed: Secondary | ICD-10-CM | POA: Diagnosis not present

## 2023-03-19 DIAGNOSIS — I10 Essential (primary) hypertension: Secondary | ICD-10-CM | POA: Diagnosis not present

## 2023-03-19 DIAGNOSIS — E1140 Type 2 diabetes mellitus with diabetic neuropathy, unspecified: Secondary | ICD-10-CM | POA: Diagnosis not present

## 2023-03-19 DIAGNOSIS — T84197S Other mechanical complication of internal fixation device of bone of left lower leg, sequela: Secondary | ICD-10-CM | POA: Diagnosis not present
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
## 2023-03-19 NOTE — Progress Notes (Signed)
[name] [name] ([id number]) [id number] Page 1 of 7 Visit Report for 03/19/2023 Arrival Information Details Patient Name: Date of Service: [name] [name] 03/19/2023 1:30 PM Medical Record Number: [id number] Patient Account Number: [id number] Date of Birth/Sex: Treating RN: 12/06/1961 (62 y.o. Marlan Palau Primary Care Nirav Sweda: Kaleen Mask Other Clinician: Referring Abdon Petrosky: Treating Charlise Giovanetti/Extender: Riki Sheer in Treatment: 5 Visit Information History Since Last Visit Added or deleted any medications: No Patient Arrived: Wheel Chair Any new allergies or adverse reactions: No Arrival Time: 13:23 Had a fall or experienced change in No Accompanied By: mother activities of daily living that may affect Transfer Assistance: Manual risk of falls: Patient Identification Verified: Yes Signs or symptoms of abuse/neglect since last visito No Secondary Verification Process Completed: Yes Hospitalized since last visit: No Patient Requires Transmission-Based Precautions: No Implantable device outside of the clinic excluding No Patient Has Alerts: No cellular tissue based products placed in the center since last visit: Has Dressing in Place as Prescribed: Yes Has Compression in Place as Prescribed: Yes Pain Present Now: No Electronic Signature(s) Signed: 03/19/2023 3:52:02 PM By: Samuella Bruin Entered By: Samuella Bruin on 03/19/2023 13:24:45 -------------------------------------------------------------------------------- Compression Therapy Details Patient Name: Date of Service: [name] [name] 03/19/2023 1:30 PM Medical Record Number: [id number] Patient Account Number: [id number] Date of Birth/Sex: Treating RN: Nov 16, 1961 (62 y.o. Marlan Palau Primary Care Mazin Emma: Kaleen Mask Other Clinician: Referring Saramarie Stinger: Treating Nahom Carfagno/Extender: Katheren Puller Weeks in Treatment: 5 Compression Therapy Performed for Wound Assessment: Wound #1 Left,Lateral Ankle Performed By: Clinician Samuella Bruin, RN Compression Type: Double Layer Post Procedure Diagnosis Same as Pre-procedure Electronic Signature(s) Signed: 03/19/2023 3:52:02 PM By: Gelene Mink By: Samuella Bruin on 03/19/2023 13:37:18 -------------------------------------------------------------------------------- Encounter Discharge Information Details Patient Name: Date of Service: [name] [name], [name] [name] 03/19/2023 1:30 PM Medical Record Number: [id number] Patient Account Number: [id number] Date of Birth/Sex: Treating RN: 04-28-1961 (62 y.o. Marlan Palau Primary Care Laverta Harnisch: Kaleen Mask Other Clinician: Referring Eula Mazzola: Treating Anavey Coombes/Extender: Riki Sheer in Treatment: 5 Encounter Discharge Information Items Discharge Condition: Stable Ambulatory Status: Wheelchair Discharge Destination: Home Transportation: [address] [place], [place] ([id number]) [id number] Page 2 of 7 Accompanied By: mother Schedule Follow-up Appointment: Yes Clinical Summary of Care: Patient Declined Electronic Signature(s) Signed: 03/19/2023 3:52:02 PM By: Gelene Mink By: Samuella Bruin on 03/19/2023 14:54:24 -------------------------------------------------------------------------------- Lower Extremity Assessment Details Patient Name: Date of Service: [name] [name] 03/19/2023 1:30 PM Medical Record Number: [id number] Patient Account Number: [id number] Date of Birth/Sex: Treating RN: [birth year] (62 y.o. Marlan Palau Primary Care Tyrell Brereton: Kaleen Mask Other Clinician: Referring Lyndon Chenoweth: Treating Konor Noren/Extender: Katheren Puller Weeks in Treatment: 5 Edema Assessment Assessed: [Left: No] [Right: No] Edema: [Left: Ye] [Right:  s] Calf Left: Right: Point of Measurement: From Medial Instep 34.6 cm Ankle Left: Right: Point of Measurement: From Medial Instep 29.5 cm Vascular Assessment Pulses: Dorsalis Pedis Palpable: [Left:Yes] Electronic Signature(s) Signed: 03/19/2023 3:52:02 PM By: Samuella Bruin Entered By: Samuella Bruin on 03/19/2023 13:30:19 -------------------------------------------------------------------------------- Multi Wound Chart Details Patient Name: Date of Service: [name] [name], [name] [name] 03/19/2023 1:30 PM Medical Record Number: [id number] Patient Account Number: [id number] Date of Birth/Sex: Treating RN: October 22, 1961 (62 y.o. M) Primary Care Zackeriah Kissler: Kaleen Mask Other Clinician: Referring Cristian Grieves: Treating Jaylin Roundy/Extender: Katheren Puller Weeks in Treatment: 5 Vital Signs Height(in): 70 Pulse(bpm): 106 Weight(lbs): 196 Blood Pressure(mmHg): 129/85 Body Mass Index(BMI): 28.1 Temperature(F): 98.6 Respiratory Rate(breaths/min):  16 [1:Photos:] [N/A:N/A] Left, Lateral Ankle N/A N/A Wound Location: Shear/Friction N/A N/A Wounding Event: Diabetic Wound/Ulcer of the Lower N/A N/A Primary Etiology: Extremity Abscess N/A N/A Secondary Etiology: Anemia, Hypertension, Type II N/A N/A Comorbid History: Diabetes, Neuropathy 12/16/2019 N/A N/A Date Acquired: 5 N/A N/A Weeks of Treatment: Open N/A N/A Wound Status: No N/A N/A Wound Recurrence: 0.6x0.4x1.1 N/A N/A Measurements L x W x [name] (cm) 0.188 N/A N/A A (cm) : rea 0.207 N/A N/A Volume (cm) : 4.10% N/A N/A % Reduction in A rea: -75.40% N/A N/A % Reduction in Volume: Grade 2 N/A N/A Classification: Medium N/A N/A Exudate A mount: Serosanguineous N/A N/A Exudate Type: red, brown N/A N/A Exudate Color: Well defined, not attached N/A N/A Wound Margin: Large (67-100%) N/A N/A Granulation A mount: Red N/A N/A Granulation Quality: None Present (0%) N/A N/A Necrotic A  mount: Fat Layer (Subcutaneous Tissue): Yes N/A N/A Exposed Structures: Fascia: No Tendon: No Muscle: No Bone: No Small (1-33%) N/A N/A Epithelialization: Scarring: Yes N/A N/A Periwound Skin Texture: Maceration: No N/A N/A Periwound Skin Moisture: Dry/Scaly: No Rubor: No N/A N/A Periwound Skin Color: No Abnormality N/A N/A Temperature: Yes N/A N/A Tenderness on Palpation: Compression Therapy N/A N/A Procedures Performed: Treatment Notes Electronic Signature(s) Signed: 03/19/2023 1:41:00 PM By: Duanne Guess MD FACS Entered By: Duanne Guess on 03/19/2023 13:40:59 -------------------------------------------------------------------------------- Multi-Disciplinary Care Plan Details Patient Name: Date of Service: [name] [name], [name] [name] 03/19/2023 1:30 PM Medical Record Number: [id number] Patient Account Number: [id number] Date of Birth/Sex: Treating RN: [birth year] (62 y.o. Marlan Palau Primary Care Prachi Oftedahl: Kaleen Mask Other Clinician: Referring Jesyca Weisenburger: Treating Abran Gavigan/Extender: Katheren Puller Weeks in Treatment: 5 Active Inactive Venous Leg Ulcer Nursing Diagnoses: Knowledge deficit related to disease process and management Goals: Patient will maintain optimal edema control Date Initiated: 02/12/2023 Target Resolution Date: 04/23/2023 Goal Status: Active Interventions: Compression as ordered Notes: Wound/Skin Impairment [name] [name], [name] [name] ([id number]) [id number] Page 4 of 7 Nursing Diagnoses: Knowledge deficit related to ulceration/compromised skin integrity Goals: Ulcer/skin breakdown will have a volume reduction of 30% by week 4 Date Initiated: 02/12/2023 Target Resolution Date: 03/26/2023 Goal Status: Active Interventions: Assess ulceration(s) every visit Provide education on ulcer and skin care Treatment Activities: Skin care regimen initiated : 02/12/2023 Topical wound management initiated  : 02/12/2023 Notes: Electronic Signature(s) Signed: 03/19/2023 3:52:02 PM By: Samuella Bruin Entered By: Samuella Bruin on 03/19/2023 14:53:32 -------------------------------------------------------------------------------- Pain Assessment Details Patient Name: Date of Service: [name] [name] 03/19/2023 1:30 PM Medical Record Number: [id number] Patient Account Number: [id number] Date of Birth/Sex: Treating RN: [birth year] (62 y.o. Marlan Palau Primary Care Saban Heinlen: Kaleen Mask Other Clinician: Referring Christien Frankl: Treating Dorris Pierre/Extender: Katheren Puller Weeks in Treatment: 5 Active Problems Location of Pain Severity and Description of Pain Patient Has Paino No Site Locations Rate the pain. Current Pain Level: 0 Pain Management and Medication Current Pain Management: Electronic Signature(s) Signed: 03/19/2023 3:52:02 PM By: Samuella Bruin Entered By: Samuella Bruin on 03/19/2023 13:25:10 -------------------------------------------------------------------------------- Patient/Caregiver Education Details Patient Name: Date of Service: [name] [name] 4/8/2024andnbsp1:30 PM Medical Record Number: [id number] Patient Account Number: [id number] Date of Birth/Gender: Treating RN: December 03, 1961 (62 y.o. Marlan Palau Primary Care Physician: Kaleen Mask Other Clinician: Donney Dice ([id number]) [id number] Page 5 of 7 Referring Physician: Treating Physician/Extender: Riki Sheer in Treatment: 5 Education Assessment Education Provided To: Patient Education Topics Provided Wound/Skin Impairment: Methods: Explain/Verbal Responses: Reinforcements needed, State content correctly Electronic Signature(s) Signed: 03/19/2023  3:52:02 PM By: Gelene MinkHerrington, Taylor Entered By: Samuella BruinHerrington, Taylor on 03/19/2023  14:53:41 -------------------------------------------------------------------------------- Wound Assessment Details Patient Name: Date of Service: [name] [name], [name] [name] 03/19/2023 1:30 PM Medical Record Number: [id number] Patient Account Number: [id number] Date of Birth/Sex: Treating RN: 1961-05-15 (62 y.o. Marlan PalauM) Herrington, Taylor Primary Care Maxton Noreen: Kaleen MaskElkins, [name] [name] Other Clinician: Referring Garey Alleva: Treating Anaija Wissink/Extender: Katheren Pullerannon, Jennifer Elkins, [name] [name] Weeks in Treatment: 5 Wound Status Wound Number: 1 Primary Etiology: Diabetic Wound/Ulcer of the Lower Extremity Wound Location: Left, Lateral Ankle Secondary Etiology: Abscess Wounding Event: Shear/Friction Wound Status: Open Date Acquired: 12/16/2019 Comorbid History: Anemia, Hypertension, Type II Diabetes, Neuropathy Weeks Of Treatment: 5 Clustered Wound: No Photos Wound Measurements Length: (cm) 0.6 Width: (cm) 0.4 Depth: (cm) 1.1 Area: (cm) 0.188 Volume: (cm) 0.207 % Reduction in Area: 4.1% % Reduction in Volume: -75.4% Epithelialization: Small (1-33%) Tunneling: No Undermining: No Wound Description Classification: Grade 2 Wound Margin: Well defined, not attached Exudate Amount: Medium Exudate Type: Serosanguineous Exudate Color: red, brown Foul Odor After Cleansing: No Slough/Fibrino Yes Wound Bed Granulation Amount: Large (67-100%) Exposed Structure Granulation Quality: Red Fascia Exposed: No Necrotic Amount: None Present (0%) Fat Layer (Subcutaneous Tissue) Exposed: Yes Tendon Exposed: No [name] [name], [name] [name] ([id number]) [id number] Page 6 of 7 Muscle Exposed: No Bone Exposed: No Periwound Skin Texture Texture Color No Abnormalities Noted: No No Abnormalities Noted: Yes Scarring: Yes Temperature / Pain Temperature: No Abnormality Moisture No Abnormalities Noted: No Tenderness on Palpation: Yes Dry / Scaly: No Maceration: No Treatment Notes Wound #1 (Ankle)  Wound Laterality: Left, Lateral Cleanser Soap and Water Discharge Instruction: May shower and wash wound with dial antibacterial soap and water prior to dressing change. Wound Cleanser Discharge Instruction: Cleanse the wound with wound cleanser prior to applying a clean dressing using gauze sponges, not tissue or cotton balls. [place] Ancillary Kit - 15 Day Supply Discharge Instruction: Use supplies as instructed; Kit contains: (15) Saline Bullets; (15) 3x3 Gauze; 15 pr Gloves Peri-Wound Care Topical Gentamicin Discharge Instruction: As directed by physician Primary Dressing Plain packing strip 1/4 (in) Discharge Instruction: Lightly pack as instructed Secondary Dressing Woven Gauze Sponge, Non-Sterile 4x4 in Discharge Instruction: Apply over primary dressing as directed. Secured With Compression Wrap ThreePress (3 layer compression wrap) Discharge Instruction: Apply three layer compression as directed. Compression Stockings Add-Ons Gloves, Medium Electronic Signature(s) Signed: 03/19/2023 3:52:02 PM By: Samuella BruinHerrington, Taylor Entered By: Samuella BruinHerrington, Taylor on 03/19/2023 13:33:30 -------------------------------------------------------------------------------- Vitals Details Patient Name: Date of Service: [name] [name], [name] [name] 03/19/2023 1:30 PM Medical Record Number: [id number] Patient Account Number: [id number] Date of Birth/Sex: Treating RN: 1961-05-15 (62 y.o. Marlan PalauM) Herrington, Taylor Primary Care Nijee Heatwole: Kaleen MaskElkins, [name] [name] Other Clinician: Referring Kaleea Penner: Treating Lowell Makara/Extender: Katheren Pullerannon, Jennifer Elkins, [name] [name] Weeks in Treatment: 5 Vital Signs Time Taken: 13:24 Temperature (F): 98.6 Height (in): 70 Pulse (bpm): 106 Weight (lbs): 196 Respiratory Rate (breaths/min): 16 Body Mass Index (BMI): 28.1 Blood Pressure (mmHg): 129/85 Reference Range: 80 - 120 mg / dl [name] [name], Cruze ([id number]) [id number] Page 7 of 7 Electronic  Signature(s) Signed: 03/19/2023 3:52:02 PM By: Samuella BruinHerrington, Taylor Entered By: Samuella BruinHerrington, Taylor on 03/19/2023 13:25:04

## 2023-03-21 ENCOUNTER — Ambulatory Visit: Payer: Commercial Managed Care - PPO | Admitting: Physical Therapy

## 2023-03-22 ENCOUNTER — Ambulatory Visit: Payer: Commercial Managed Care - PPO | Admitting: Internal Medicine

## 2023-03-22 ENCOUNTER — Encounter: Payer: Self-pay | Admitting: Internal Medicine

## 2023-03-22 ENCOUNTER — Other Ambulatory Visit: Payer: Self-pay

## 2023-03-22 ENCOUNTER — Ambulatory Visit: Payer: Commercial Managed Care - PPO | Admitting: Physical Therapy

## 2023-03-22 VITALS — BP 123/83 | HR 104 | Temp 98.7°F | Resp 16

## 2023-03-22 DIAGNOSIS — M8618 Other acute osteomyelitis, other site: Secondary | ICD-10-CM | POA: Diagnosis not present

## 2023-03-22 DIAGNOSIS — M869 Osteomyelitis, unspecified: Secondary | ICD-10-CM

## 2023-03-22 DIAGNOSIS — T847XXA Infection and inflammatory reaction due to other internal orthopedic prosthetic devices, implants and grafts, initial encounter: Secondary | ICD-10-CM | POA: Diagnosis not present

## 2023-03-22 NOTE — Patient Instructions (Addendum)
No antibiotics for now   We need to see what is done in terms of hardware removal, and what grows in deep tissue culture/bone/hardware before we can decide on the choice of antibiotics and duration   When you have surgery at [facility] next week, the infectious disease inpatient consulting team will need to be called to review case and devise treatment  I would advise staying in the hospital for a few days to help decide this as this is rather complicated    Please make an appointment to see me in 6 weeks to review case and discuss further antibiotics treatment
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
## 2023-03-26 ENCOUNTER — Encounter (HOSPITAL_BASED_OUTPATIENT_CLINIC_OR_DEPARTMENT_OTHER): Payer: Commercial Managed Care - PPO | Admitting: General Surgery

## 2023-03-28 ENCOUNTER — Other Ambulatory Visit: Payer: Self-pay

## 2023-03-28 ENCOUNTER — Encounter (HOSPITAL_COMMUNITY): Payer: Self-pay | Admitting: Orthopedic Surgery

## 2023-03-28 NOTE — Progress Notes (Signed)
SDW CALL  Patient was given pre-op instructions over the phone. The opportunity was given for the patient to ask questions. No further questions asked. Patient verbalized understanding of instructions given.   PCP - Dr. Jeannetta Nap Cardiologist - denies  Chest x-ray -  EKG - 11-29-22 Stress Test -  ECHO -  Cardiac Cath -   Fasting Blood Sugar - 120's Checks Blood Sugar normally daily, but ran out of strips. If able to obtain more CBG stips, check CBG AM of surgery, if <70, drink 1/2 cup of clear juice and recheck in 15 mins  Blood Thinner Instructions: n/a Aspirin Instructions:  ERAS Protcol - NPO  COVID TEST- n/a   Anesthesia review: no  Patient denies shortness of breath, fever, cough and chest pain over the phone call   All instructions explained to the patient, with a verbal understanding of the material. Patient agrees to go over the instructions while at home for a better understanding.

## 2023-03-28 NOTE — Anesthesia Preprocedure Evaluation (Addendum)
Anesthesia Evaluation  Patient identified by MRN, date of birth, ID band Patient awake    Reviewed: Allergy & Precautions, Patient's Chart, lab work & pertinent test results  History of Anesthesia Complications (+) PROLONGED EMERGENCE and history of anesthetic complications  Airway Mallampati: II  TM Distance: >3 FB Neck ROM: Full    Dental  (+) Dental Advisory Given, Poor Dentition, Chipped, Missing,    Pulmonary former smoker   Pulmonary exam normal breath sounds clear to auscultation       Cardiovascular Exercise Tolerance: Good negative cardio ROS Normal cardiovascular exam Rhythm:Regular Rate:Normal     Neuro/Psych negative neurological ROS  negative psych ROS   GI/Hepatic negative GI ROS, Neg liver ROS,,,  Endo/Other  diabetes, Type 2, Oral Hypoglycemic Agents    Renal/GU negative Renal ROS     Musculoskeletal osteomyelitis   Abdominal   Peds  Hematology negative hematology ROS (+)   Anesthesia Other Findings   Reproductive/Obstetrics                             Anesthesia Physical Anesthesia Plan  ASA: 2  Anesthesia Plan: General   Post-op Pain Management: Tylenol PO (pre-op)*, Toradol IV (intra-op)* and Gabapentin PO (pre-op)*   Induction:   PONV Risk Score and Plan: 2 and Midazolam, Dexamethasone and Ondansetron  Airway Management Planned: LMA  Additional Equipment:   Intra-op Plan:   Post-operative Plan: Extubation in OR  Informed Consent: I have reviewed the patients History and Physical, chart, labs and discussed the procedure including the risks, benefits and alternatives for the proposed anesthesia with the patient or authorized representative who has indicated his/her understanding and acceptance.     Dental advisory given  Plan Discussed with: CRNA  Anesthesia Plan Comments:        Anesthesia Quick Evaluation

## 2023-03-29 ENCOUNTER — Inpatient Hospital Stay (HOSPITAL_COMMUNITY): Payer: Commercial Managed Care - PPO

## 2023-03-29 ENCOUNTER — Encounter (HOSPITAL_COMMUNITY)
Admission: RE | Disposition: A | Discharge status: Home / Self Care | Payer: Self-pay | Source: Home / Self Care | Attending: Orthopedic Surgery

## 2023-03-29 ENCOUNTER — Inpatient Hospital Stay (HOSPITAL_COMMUNITY)
Admission: RE | Admit: 2023-03-29 | Discharge: 2023-03-30 | DRG: 493 | Disposition: A | Discharge status: Home / Self Care | Payer: Commercial Managed Care - PPO | Attending: Orthopedic Surgery | Admitting: Orthopedic Surgery

## 2023-03-29 ENCOUNTER — Encounter (HOSPITAL_COMMUNITY): Payer: Self-pay | Admitting: Orthopedic Surgery

## 2023-03-29 ENCOUNTER — Other Ambulatory Visit: Payer: Self-pay

## 2023-03-29 ENCOUNTER — Inpatient Hospital Stay (HOSPITAL_COMMUNITY): Payer: Commercial Managed Care - PPO | Admitting: Certified Registered"

## 2023-03-29 DIAGNOSIS — M86462 Chronic osteomyelitis with draining sinus, left tibia and fibula: Secondary | ICD-10-CM | POA: Diagnosis present

## 2023-03-29 DIAGNOSIS — T84625A Infection and inflammatory reaction due to internal fixation device of left fibula, initial encounter: Principal | ICD-10-CM | POA: Diagnosis present

## 2023-03-29 DIAGNOSIS — Z01818 Encounter for other preprocedural examination: Secondary | ICD-10-CM

## 2023-03-29 DIAGNOSIS — M869 Osteomyelitis, unspecified: Secondary | ICD-10-CM

## 2023-03-29 DIAGNOSIS — Y792 Prosthetic and other implants, materials and accessory orthopedic devices associated with adverse incidents: Secondary | ICD-10-CM | POA: Diagnosis present

## 2023-03-29 DIAGNOSIS — T84623A Infection and inflammatory reaction due to internal fixation device of left tibia, initial encounter: Secondary | ICD-10-CM | POA: Diagnosis present

## 2023-03-29 DIAGNOSIS — Z8249 Family history of ischemic heart disease and other diseases of the circulatory system: Secondary | ICD-10-CM | POA: Diagnosis not present

## 2023-03-29 DIAGNOSIS — T847XXA Infection and inflammatory reaction due to other internal orthopedic prosthetic devices, implants and grafts, initial encounter: Secondary | ICD-10-CM | POA: Diagnosis not present

## 2023-03-29 DIAGNOSIS — Z7984 Long term (current) use of oral hypoglycemic drugs: Secondary | ICD-10-CM

## 2023-03-29 DIAGNOSIS — D62 Acute posthemorrhagic anemia: Secondary | ICD-10-CM | POA: Diagnosis not present

## 2023-03-29 DIAGNOSIS — M86662 Other chronic osteomyelitis, left tibia and fibula: Secondary | ICD-10-CM | POA: Diagnosis present

## 2023-03-29 DIAGNOSIS — Z79899 Other long term (current) drug therapy: Secondary | ICD-10-CM

## 2023-03-29 DIAGNOSIS — Z87891 Personal history of nicotine dependence: Secondary | ICD-10-CM

## 2023-03-29 DIAGNOSIS — E119 Type 2 diabetes mellitus without complications: Secondary | ICD-10-CM

## 2023-03-29 HISTORY — PX: HARDWARE REMOVAL: SHX979

## 2023-03-29 LAB — GLUCOSE, CAPILLARY
Glucose-Capillary: 121 mg/dL — ABNORMAL HIGH (ref 70–99)
Glucose-Capillary: 96 mg/dL (ref 70–99)
Glucose-Capillary: 125 mg/dL — ABNORMAL HIGH (ref 70–99)
Glucose-Capillary: 227 mg/dL — ABNORMAL HIGH (ref 70–99)
Glucose-Capillary: 239 mg/dL — ABNORMAL HIGH (ref 70–99)

## 2023-03-29 LAB — CBC WITH DIFFERENTIAL/PLATELET
Abs Immature Granulocytes: 0.02 10*3/uL (ref 0.00–0.07)
Basophils Absolute: 0.1 10*3/uL (ref 0.0–0.1)
Basophils Relative: 1 %
Eosinophils Absolute: 0.3 10*3/uL (ref 0.0–0.5)
Eosinophils Relative: 3 %
HCT: 41.3 % (ref 39.0–52.0)
Hemoglobin: 15.5 g/dL (ref 13.0–17.0)
Immature Granulocytes: 0 %
Lymphocytes Relative: 42 %
Lymphs Abs: 3.7 10*3/uL (ref 0.7–4.0)
MCH: 30.8 pg (ref 26.0–34.0)
MCHC: 37.5 g/dL — ABNORMAL HIGH (ref 30.0–36.0)
MCV: 82.1 fL (ref 80.0–100.0)
Monocytes Absolute: 0.9 10*3/uL (ref 0.1–1.0)
Monocytes Relative: 10 %
Neutro Abs: 3.9 10*3/uL (ref 1.7–7.7)
Neutrophils Relative %: 44 %
Platelets: 270 10*3/uL (ref 150–400)
RBC: 5.03 MIL/uL (ref 4.22–5.81)
RDW: 14.2 % (ref 11.5–15.5)
WBC: 8.9 10*3/uL (ref 4.0–10.5)
nRBC: 0 % (ref 0.0–0.2)

## 2023-03-29 LAB — COMPREHENSIVE METABOLIC PANEL
ALT: 12 U/L (ref 0–44)
AST: 13 U/L — ABNORMAL LOW (ref 15–41)
Albumin: 3.7 g/dL (ref 3.5–5.0)
Alkaline Phosphatase: 45 U/L (ref 38–126)
Anion gap: 11 (ref 5–15)
BUN: 7 mg/dL (ref 6–20)
CO2: 23 mmol/L (ref 22–32)
Calcium: 9 mg/dL (ref 8.9–10.3)
Chloride: 105 mmol/L (ref 98–111)
Creatinine, Ser: 0.6 mg/dL — ABNORMAL LOW (ref 0.61–1.24)
GFR, Estimated: >60 mL/min (ref >60)
Glucose, Bld: 105 mg/dL — ABNORMAL HIGH (ref 70–99)
Potassium: 3.7 mmol/L (ref 3.5–5.1)
Sodium: 139 mmol/L (ref 135–145)
Total Bilirubin: 0.6 mg/dL (ref 0.3–1.2)
Total Protein: 6.6 g/dL (ref 6.5–8.1)

## 2023-03-29 LAB — HEMOGLOBIN A1C
Hgb A1c MFr Bld: 5.3 % (ref 4.8–5.6)
Mean Plasma Glucose: 105.41 mg/dL

## 2023-03-29 LAB — AEROBIC/ANAEROBIC CULTURE W GRAM STAIN (SURGICAL/DEEP WOUND)

## 2023-03-29 LAB — C-REACTIVE PROTEIN: CRP: <0.5 mg/dL (ref <1.0)

## 2023-03-29 LAB — SEDIMENTATION RATE: Sed Rate: 3 mm/hr (ref 0–16)

## 2023-03-29 SURGERY — REMOVAL, HARDWARE
Anesthesia: General | Laterality: Left

## 2023-03-29 MED ORDER — VANCOMYCIN HCL 1000 MG IV SOLR
INTRAVENOUS | Status: AC
  Filled 2023-03-29: qty 20

## 2023-03-29 MED ORDER — POTASSIUM CHLORIDE IN NACL 20-0.9 MEQ/L-% IV SOLN
INTRAVENOUS | Status: DC
  Filled 2023-03-29: qty 1000

## 2023-03-29 MED ORDER — CHLORHEXIDINE GLUCONATE 0.12 % MT SOLN
15.0000 mL | Freq: Once | OROMUCOSAL | Status: AC
  Administered 2023-03-29: 15 mL via OROMUCOSAL
  Filled 2023-03-29: qty 15

## 2023-03-29 MED ORDER — METOCLOPRAMIDE HCL 5 MG PO TABS: 5.0000 mg | ORAL_TABLET | Freq: Three times a day (TID) | ORAL | Status: DC | PRN

## 2023-03-29 MED ORDER — FENTANYL CITRATE (PF) 250 MCG/5ML IJ SOLN
INTRAMUSCULAR | Status: AC
  Filled 2023-03-29: qty 5

## 2023-03-29 MED ORDER — PHENYLEPHRINE HCL-NACL 20-0.9 MG/250ML-% IV SOLN
INTRAVENOUS | Status: AC
  Filled 2023-03-29: qty 500

## 2023-03-29 MED ORDER — ACETAMINOPHEN 500 MG PO TABS
500.0000 mg | ORAL_TABLET | Freq: Four times a day (QID) | ORAL | Status: DC
  Administered 2023-03-29 – 2023-03-30 (×4): 500 mg via ORAL
  Filled 2023-03-29 (×5): qty 1

## 2023-03-29 MED ORDER — KETOROLAC TROMETHAMINE 15 MG/ML IJ SOLN
7.5000 mg | Freq: Four times a day (QID) | INTRAMUSCULAR | Status: DC
  Administered 2023-03-29 – 2023-03-30 (×5): 7.5 mg via INTRAVENOUS
  Filled 2023-03-29 (×5): qty 1

## 2023-03-29 MED ORDER — METOCLOPRAMIDE HCL 5 MG/ML IJ SOLN: 5.0000 mg | Freq: Three times a day (TID) | INTRAMUSCULAR | Status: DC | PRN

## 2023-03-29 MED ORDER — HYDROCODONE-ACETAMINOPHEN 5-325 MG PO TABS
1.0000 | ORAL_TABLET | ORAL | Status: DC | PRN
  Administered 2023-03-29 – 2023-03-30 (×3): 1 via ORAL
  Filled 2023-03-29 (×3): qty 1

## 2023-03-29 MED ORDER — FENTANYL CITRATE (PF) 250 MCG/5ML IJ SOLN
INTRAMUSCULAR | Status: DC | PRN
  Administered 2023-03-29: 50 ug via INTRAVENOUS
  Administered 2023-03-29: 25 ug via INTRAVENOUS
  Administered 2023-03-29: 50 ug via INTRAVENOUS
  Administered 2023-03-29: 25 ug via INTRAVENOUS

## 2023-03-29 MED ORDER — DEXAMETHASONE SODIUM PHOSPHATE 10 MG/ML IJ SOLN
INTRAMUSCULAR | Status: DC | PRN
  Administered 2023-03-29: 5 mg via INTRAVENOUS

## 2023-03-29 MED ORDER — KETOROLAC TROMETHAMINE 30 MG/ML IJ SOLN
INTRAMUSCULAR | Status: AC
  Filled 2023-03-29: qty 1

## 2023-03-29 MED ORDER — MIDAZOLAM HCL 2 MG/2ML IJ SOLN
INTRAMUSCULAR | Status: DC | PRN
  Administered 2023-03-29: 2 mg via INTRAVENOUS

## 2023-03-29 MED ORDER — ROCURONIUM BROMIDE 10 MG/ML (PF) SYRINGE
PREFILLED_SYRINGE | INTRAVENOUS | Status: AC
  Filled 2023-03-29: qty 10

## 2023-03-29 MED ORDER — MIDAZOLAM HCL 2 MG/2ML IJ SOLN
INTRAMUSCULAR | Status: AC
  Filled 2023-03-29: qty 2

## 2023-03-29 MED ORDER — ONDANSETRON HCL 4 MG PO TABS: 4.0000 mg | ORAL_TABLET | Freq: Four times a day (QID) | ORAL | Status: DC | PRN

## 2023-03-29 MED ORDER — PROPOFOL 10 MG/ML IV BOLUS
INTRAVENOUS | Status: AC
  Filled 2023-03-29: qty 20

## 2023-03-29 MED ORDER — DEXAMETHASONE SODIUM PHOSPHATE 10 MG/ML IJ SOLN
INTRAMUSCULAR | Status: AC
  Filled 2023-03-29: qty 1

## 2023-03-29 MED ORDER — SODIUM CHLORIDE 0.9 % IV SOLN
2.0000 g | Freq: Three times a day (TID) | INTRAVENOUS | Status: DC
  Administered 2023-03-29 – 2023-03-30 (×4): 2 g via INTRAVENOUS
  Filled 2023-03-29 (×4): qty 12.5

## 2023-03-29 MED ORDER — PHENYLEPHRINE HCL (PRESSORS) 10 MG/ML IV SOLN
INTRAVENOUS | Status: DC | PRN
  Administered 2023-03-29: 80 ug via INTRAVENOUS

## 2023-03-29 MED ORDER — ENOXAPARIN SODIUM 40 MG/0.4ML IJ SOSY
40.0000 mg | PREFILLED_SYRINGE | INTRAMUSCULAR | Status: DC
  Administered 2023-03-30: 40 mg via SUBCUTANEOUS
  Filled 2023-03-29: qty 0.4

## 2023-03-29 MED ORDER — PHENYLEPHRINE 80 MCG/ML (10ML) SYRINGE FOR IV PUSH (FOR BLOOD PRESSURE SUPPORT)
PREFILLED_SYRINGE | INTRAVENOUS | Status: AC
  Filled 2023-03-29: qty 10

## 2023-03-29 MED ORDER — EPHEDRINE 5 MG/ML INJ
INTRAVENOUS | Status: AC
  Filled 2023-03-29: qty 5

## 2023-03-29 MED ORDER — DOCUSATE SODIUM 100 MG PO CAPS
100.0000 mg | ORAL_CAPSULE | Freq: Two times a day (BID) | ORAL | Status: DC
  Administered 2023-03-30: 100 mg via ORAL
  Filled 2023-03-29 (×2): qty 1

## 2023-03-29 MED ORDER — ACETAMINOPHEN 325 MG PO TABS: 325.0000 mg | ORAL_TABLET | Freq: Four times a day (QID) | ORAL | Status: DC | PRN

## 2023-03-29 MED ORDER — 0.9 % SODIUM CHLORIDE (POUR BTL) OPTIME
TOPICAL | Status: DC | PRN
  Administered 2023-03-29: 1000 mL

## 2023-03-29 MED ORDER — ORAL CARE MOUTH RINSE: 15.0000 mL | Freq: Once | OROMUCOSAL | Status: AC

## 2023-03-29 MED ORDER — INSULIN ASPART 100 UNIT/ML IJ SOLN
0.0000 [IU] | Freq: Three times a day (TID) | INTRAMUSCULAR | Status: DC
  Administered 2023-03-29: 5 [IU] via SUBCUTANEOUS
  Administered 2023-03-30: 2 [IU] via SUBCUTANEOUS

## 2023-03-29 MED ORDER — ACETAMINOPHEN 500 MG PO TABS
1000.0000 mg | ORAL_TABLET | Freq: Once | ORAL | Status: AC
  Administered 2023-03-29: 1000 mg via ORAL
  Filled 2023-03-29: qty 2

## 2023-03-29 MED ORDER — HYDROCODONE-ACETAMINOPHEN 7.5-325 MG PO TABS
1.0000 | ORAL_TABLET | ORAL | Status: DC | PRN
  Administered 2023-03-30: 1 via ORAL
  Filled 2023-03-29: qty 1

## 2023-03-29 MED ORDER — LACTATED RINGERS IV SOLN: INTRAVENOUS | Status: DC

## 2023-03-29 MED ORDER — KETAMINE HCL 50 MG/5ML IJ SOSY
PREFILLED_SYRINGE | INTRAMUSCULAR | Status: AC
  Filled 2023-03-29: qty 5

## 2023-03-29 MED ORDER — ONDANSETRON HCL 4 MG/2ML IJ SOLN
INTRAMUSCULAR | Status: DC | PRN
  Administered 2023-03-29: 4 mg via INTRAVENOUS

## 2023-03-29 MED ORDER — FENTANYL CITRATE (PF) 100 MCG/2ML IJ SOLN
25.0000 ug | INTRAMUSCULAR | Status: DC | PRN
  Administered 2023-03-29 (×2): 25 ug via INTRAVENOUS

## 2023-03-29 MED ORDER — LIDOCAINE 2% (20 MG/ML) 5 ML SYRINGE
INTRAMUSCULAR | Status: AC
  Filled 2023-03-29: qty 5

## 2023-03-29 MED ORDER — PROMETHAZINE HCL 25 MG/ML IJ SOLN: 6.2500 mg | INTRAMUSCULAR | Status: DC | PRN

## 2023-03-29 MED ORDER — ONDANSETRON HCL 4 MG/2ML IJ SOLN: 4.0000 mg | Freq: Four times a day (QID) | INTRAMUSCULAR | Status: DC | PRN

## 2023-03-29 MED ORDER — INSULIN ASPART 100 UNIT/ML IJ SOLN: 0.0000 [IU] | INTRAMUSCULAR | Status: DC | PRN

## 2023-03-29 MED ORDER — PROPOFOL 10 MG/ML IV BOLUS
INTRAVENOUS | Status: DC | PRN
  Administered 2023-03-29: 150 mg via INTRAVENOUS

## 2023-03-29 MED ORDER — KETOROLAC TROMETHAMINE 30 MG/ML IJ SOLN
INTRAMUSCULAR | Status: DC | PRN
  Administered 2023-03-29: 30 mg via INTRAVENOUS

## 2023-03-29 MED ORDER — KETAMINE HCL 10 MG/ML IJ SOLN
INTRAMUSCULAR | Status: DC | PRN
  Administered 2023-03-29: 20 mg via INTRAVENOUS
  Administered 2023-03-29: 10 mg via INTRAVENOUS

## 2023-03-29 MED ORDER — METHOCARBAMOL 1000 MG/10ML IJ SOLN: 500.0000 mg | Freq: Four times a day (QID) | INTRAVENOUS | Status: DC | PRN

## 2023-03-29 MED ORDER — PHENYLEPHRINE HCL-NACL 20-0.9 MG/250ML-% IV SOLN
INTRAVENOUS | Status: DC | PRN
  Administered 2023-03-29: 20 ug/min via INTRAVENOUS

## 2023-03-29 MED ORDER — METHOCARBAMOL 500 MG PO TABS: 500.0000 mg | ORAL_TABLET | Freq: Four times a day (QID) | ORAL | Status: DC | PRN

## 2023-03-29 MED ORDER — FENTANYL CITRATE (PF) 100 MCG/2ML IJ SOLN
INTRAMUSCULAR | Status: AC
  Filled 2023-03-29: qty 2

## 2023-03-29 MED ORDER — ONDANSETRON HCL 4 MG/2ML IJ SOLN
INTRAMUSCULAR | Status: AC
  Filled 2023-03-29: qty 2

## 2023-03-29 MED ORDER — LIDOCAINE 2% (20 MG/ML) 5 ML SYRINGE
INTRAMUSCULAR | Status: DC | PRN
  Administered 2023-03-29: 100 mg via INTRAVENOUS

## 2023-03-29 MED ORDER — SODIUM CHLORIDE 0.9 % IV SOLN
8.0000 mg/kg | Freq: Every day | INTRAVENOUS | Status: DC
  Administered 2023-03-29 – 2023-03-30 (×2): 700 mg via INTRAVENOUS
  Filled 2023-03-29 (×2): qty 14

## 2023-03-29 MED ORDER — MORPHINE SULFATE (PF) 2 MG/ML IV SOLN: 0.5000 mg | INTRAVENOUS | Status: DC | PRN

## 2023-03-29 SURGICAL SUPPLY — 66 items
BAG COUNTER SPONGE SURGICOUNT (BAG) ×1 IMPLANT
BANDAGE ESMARK 6X9 LF (GAUZE/BANDAGES/DRESSINGS) ×1 IMPLANT
BNDG COHESIVE 6X5 TAN ST LF (GAUZE/BANDAGES/DRESSINGS) ×1 IMPLANT
BNDG ELASTIC 4X5.8 VLCR STR LF (GAUZE/BANDAGES/DRESSINGS) ×1 IMPLANT
BNDG ELASTIC 6INX 5YD STR LF (GAUZE/BANDAGES/DRESSINGS) IMPLANT
BNDG ELASTIC 6X5.8 VLCR STR LF (GAUZE/BANDAGES/DRESSINGS) ×1 IMPLANT
BNDG ESMARK 6X9 LF (GAUZE/BANDAGES/DRESSINGS)
BNDG GAUZE DERMACEA FLUFF 4 (GAUZE/BANDAGES/DRESSINGS) ×2 IMPLANT
BRUSH SCRUB EZ PLAIN DRY (MISCELLANEOUS) ×2 IMPLANT
COVER SURGICAL LIGHT HANDLE (MISCELLANEOUS) ×2 IMPLANT
CUFF TOURN SGL QUICK 18X4 (TOURNIQUET CUFF) IMPLANT
CUFF TOURN SGL QUICK 24 (TOURNIQUET CUFF)
CUFF TOURN SGL QUICK 34 (TOURNIQUET CUFF)
CUFF TRNQT CYL 24X4X16.5-23 (TOURNIQUET CUFF) IMPLANT
CUFF TRNQT CYL 34X4.125X (TOURNIQUET CUFF) IMPLANT
DRAPE C-ARM 42X72 X-RAY (DRAPES) IMPLANT
DRAPE C-ARMOR (DRAPES) ×1 IMPLANT
DRAPE U-SHAPE 47X51 STRL (DRAPES) ×1 IMPLANT
DRSG ADAPTIC 3X8 NADH LF (GAUZE/BANDAGES/DRESSINGS) ×1 IMPLANT
DRSG MEPITEL 4X7.2 (GAUZE/BANDAGES/DRESSINGS) IMPLANT
ELECT REM PT RETURN 9FT ADLT (ELECTROSURGICAL) ×1
ELECTRODE REM PT RTRN 9FT ADLT (ELECTROSURGICAL) ×1 IMPLANT
GAUZE SPONGE 2X2 STRL 8-PLY (GAUZE/BANDAGES/DRESSINGS) IMPLANT
GAUZE SPONGE 4X4 12PLY STRL (GAUZE/BANDAGES/DRESSINGS) ×1 IMPLANT
GLOVE BIO SURGEON STRL SZ7.5 (GLOVE) ×1 IMPLANT
GLOVE BIO SURGEON STRL SZ8 (GLOVE) ×1 IMPLANT
GLOVE BIOGEL PI IND STRL 7.5 (GLOVE) ×1 IMPLANT
GLOVE BIOGEL PI IND STRL 8 (GLOVE) ×1 IMPLANT
GLOVE SURG ORTHO LTX SZ7.5 (GLOVE) ×2 IMPLANT
GOWN STRL REUS W/ TWL LRG LVL3 (GOWN DISPOSABLE) ×2 IMPLANT
GOWN STRL REUS W/ TWL XL LVL3 (GOWN DISPOSABLE) ×1 IMPLANT
GOWN STRL REUS W/TWL LRG LVL3 (GOWN DISPOSABLE) ×2
GOWN STRL REUS W/TWL XL LVL3 (GOWN DISPOSABLE) ×1
KIT BASIN OR (CUSTOM PROCEDURE TRAY) ×1 IMPLANT
KIT TURNOVER KIT B (KITS) ×1 IMPLANT
MANIFOLD NEPTUNE II (INSTRUMENTS) ×1 IMPLANT
NDL 22X1.5 STRL (OR ONLY) (MISCELLANEOUS) IMPLANT
NEEDLE 22X1.5 STRL (OR ONLY) (MISCELLANEOUS) IMPLANT
NS IRRIG 1000ML POUR BTL (IV SOLUTION) ×1 IMPLANT
PACK ORTHO EXTREMITY (CUSTOM PROCEDURE TRAY) ×1 IMPLANT
PAD ABD 8X10 STRL (GAUZE/BANDAGES/DRESSINGS) IMPLANT
PAD ARMBOARD 7.5X6 YLW CONV (MISCELLANEOUS) ×2 IMPLANT
PAD CAST 4YDX4 CTTN HI CHSV (CAST SUPPLIES) IMPLANT
PADDING CAST COTTON 4X4 STRL (CAST SUPPLIES) ×1
PADDING CAST COTTON 6X4 STRL (CAST SUPPLIES) ×3 IMPLANT
RASP HELIOCORDIAL MED (MISCELLANEOUS) IMPLANT
SPONGE T-LAP 18X18 ~~LOC~~+RFID (SPONGE) ×1 IMPLANT
STAPLER VISISTAT 35W (STAPLE) IMPLANT
STOCKINETTE IMPERVIOUS LG (DRAPES) ×1 IMPLANT
STRIP CLOSURE SKIN 1/2X4 (GAUZE/BANDAGES/DRESSINGS) IMPLANT
SUCTION FRAZIER HANDLE 10FR (MISCELLANEOUS)
SUCTION TUBE FRAZIER 10FR DISP (MISCELLANEOUS) IMPLANT
SUT ETHILON 2 0 FS 18 (SUTURE) IMPLANT
SUT ETHILON 2 0 PSLX (SUTURE) IMPLANT
SUT PDS AB 2-0 CT1 27 (SUTURE) IMPLANT
SUT VIC AB 0 CT1 27 (SUTURE) ×1
SUT VIC AB 0 CT1 27XBRD ANBCTR (SUTURE) IMPLANT
SUT VIC AB 2-0 CT1 27 (SUTURE) ×1
SUT VIC AB 2-0 CT1 TAPERPNT 27 (SUTURE) IMPLANT
SYR CONTROL 10ML LL (SYRINGE) IMPLANT
TOWEL GREEN STERILE (TOWEL DISPOSABLE) ×2 IMPLANT
TOWEL GREEN STERILE FF (TOWEL DISPOSABLE) ×2 IMPLANT
TUBE CONNECTING 12X1/4 (SUCTIONS) ×1 IMPLANT
UNDERPAD 30X36 HEAVY ABSORB (UNDERPADS AND DIAPERS) ×1 IMPLANT
WATER STERILE IRR 1000ML POUR (IV SOLUTION) ×2 IMPLANT
YANKAUER SUCT BULB TIP NO VENT (SUCTIONS) ×1 IMPLANT

## 2023-03-29 NOTE — Progress Notes (Signed)
Pharmacy Antibiotic Note  [name] [name] is a 62 y.o. male admitted on 03/29/2023 with LLE distal osteo s/p I&D by ortho on 4/18. Pharmacy has been consulted to start Cefepime + Daptomycin for empiric coverage post-op.   Plan: - Start Daptomycin 700 mg (~8 mg/kg) every 24 hours - Weekly CK checks on Fridays - Start Cefepime 2g IV every 8 hours - Will continue to follow renal function, culture results, LOT, and antibiotic de-escalation plans   Height:  (177.8 cm) Weight: 89.8 kg (198 lb) IBW/kg (Calculated) : 73  Temp (24hrs), Avg:98.3 F (36.8 C), Min:97.8 F (36.6 C), Max:98.8 F (37.1 C)  Recent Labs  Lab 03/29/23 0600  WBC 8.9  CREATININE 0.60*    Estimated Creatinine Clearance: 112.1 mL/min (A) (by C-G formula based on SCr of 0.6 mg/dL (L)).    No Known Allergies  Antimicrobials this admission: Daptomycin 4/18 >> Cefepime 4/18 >>  Microbiology results: 4/18 LLE wound cx >> 4/18 LLE bone cx >>  Thank you for allowing pharmacy to be a part of this patient's care.  Georgina Pillion, PharmD, BCPS Infectious Diseases Clinical Pharmacist 03/29/2023 2:16 PM   **Pharmacist phone directory can now be found on [email or web address] ([email or web address]).  Listed under [facility].
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
## 2023-03-29 NOTE — Transfer of Care (Signed)
Immediate Anesthesia Transfer of Care Note  Patient: [name] [name]  Procedure(s) Performed: HARDWARE REMOVAL ON ANKLE (Left)  Patient Location: PACU  Anesthesia Type:General  Level of Consciousness: awake, oriented, and sedated  Airway & Oxygen Therapy: Patient Spontanous Breathing and Patient connected to face mask oxygen  Post-op Assessment: Report given to RN and Post -op Vital signs reviewed and stable  Post vital signs: Reviewed and stable  Last Vitals:  Vitals Value Taken Time  BP 131/85 03/29/23 1100  Temp    Pulse 87 03/29/23 1100  Resp 17 03/29/23 1100  SpO2 100 % 03/29/23 1100  Vitals shown include unvalidated device data.  Last Pain:  Vitals:   03/29/23 0707  TempSrc:   PainSc: 4       Patients Stated Pain Goal: 0 (03/29/23 0707)  Complications: No notable events documented.

## 2023-03-29 NOTE — Op Note (Signed)
NAME: AVIEN, TAHA MEDICAL RECORD NO: [id number] ACCOUNT NO: [id number] DATE OF BIRTH: 29-Oct-1961 FACILITY: [facility] LOCATION: [facility] PHYSICIAN: Doralee Albino. Carola Frost, MD  Operative Report   DATE OF PROCEDURE: 03/29/2023  PREOPERATIVE DIAGNOSES: 1.  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 2.  Chronic osteomyelitis, left tibia.  POSTOPERATIVE DIAGNOSES: 1.  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 2.  Chronic osteomyelitis, left tibia.  PROCEDURES:   1.  Removal of deep implants, left tibia. 2.  Removal of deep implants, left fibula. 3.  Partial excision of left tibia. 4.  Partial excision of left fibula.  SURGEON:  Doralee Albino. Carola Frost, MD.  ASSISTANT:  None.  ANESTHESIA:  General.  COMPLICATIONS:  None.  TOURNIQUET:  None.  SPECIMENS:  Multiple including 1.  Bone from within the left fibula, the intramedullary screw from the left fibula. 2.  The peri-implant abscess of the left tibia, soft tissue abscess posteromedial to the tibia.  Disposition of specimens to micro.  DISPOSITION:  To PACU.  CONDITION:  Stable.  BRIEF SUMMARY OF INDICATIONS FOR PROCEDURE:  The patient is a very pleasant 62 year old male who underwent a remote ORIF of a pilon fracture that had to be performed in staged fashion.  He developed a chronic draining sinus from the left fibula.  I saw  and evaluated him in the office ten days ago, at which time I suggested a return to the OR for debridement and hardware removal with partial excision of the fibula.  On presentation today, his dressing had a spot drainage from areas on the tibia as well.   Consequently, we deemed it appropriate to proceed with removal of both the tibial and fibular hardware.  Risks discussed with him included persistent infection, nerve injury, vessel injury, DVT, PE, loss of motion and others.  He acknowledged these  risks and did wish to proceed.  BRIEF SUMMARY OF PROCEDURE:  The patient was taken to the  operating room where general anesthesia was induced.  Antibiotics were held preoperatively until all specimens were obtained.  After chlorhexidine wash, Betadine scrub and paint timeout was held  following the draping.  I then began with the lateral incision over the tibia, which was for the most part directly anterior.  This area appeared to be completely uninvolved and was distinct from the other incisions used to perform a combined procedures  including the partial excisions.  This incision; however, again was just for hardware removal of an anterolateral plate placed on the tibia.  I carried dissection carefully down, retracted the tendons and removed the screws first manually and then with  power.  The plate had partially grown over with bone and there was no evidence of purulence or infection whatsoever.  Osteotome was used to remove the overlying bone and then the plate.  This irrigated thoroughly and packed.  I then turned attention to  the medial side.  Here, the area of the implant was reconfirmed with C-arm. The entirety of the incision over this was remade and dissection carried down to the plate as I got to the mid portion of the plate I encountered a pocket of purulence.   Anaerobic aerobic cultures were sent from this pus pocket.  I then continued exposing all the hardware.  All of the proximal screws were able to be withdrawn.  Distally two of the lock screws stripped and could not be removed.  The multidirectional screw  was removed without difficulty also, I then had to bring in the easy out  system.  This was unsuccessful.  I followed with the Midas Rex and bored out the heads.  Plate was then able to be pried off and I could grab what was exposed of the screws and  removed them with the rongeur as the threads themselves were not stripped. All the screws seemed appropriately fixed without excessive loosening or evidence of deep infection extending along the screws into the intramedullary  canal of the tibia.   However, the presence of osteomyelitis with significant involvement of the periosteum layer of the bone was definitely involved and there was a fibrinous layer around the entirety of the plate.  A large curette was used to debride this and excise it.  I  then used a curette to excise the periosteal layer of the bone scraping down to cortex in most areas and completely removing any contaminated tissue.  Once this was achieved, I then noticed a darkened area and the soft tissues extending posteriorly.  I  used a clean curette to began to scrape this away and as I went deeper I encountered another pocket of purulence.  This was also cultured with anaerobic, aerobic specimens.  This pocket seemed to extend somewhat up and down the tendons, but I was not  able to express a large cavity beyond the local area, which seemed to be 1 x 1 cm and about 2 cm deep toward the posteromedial tendons.  This was again curetted and then aggressively irrigated.  The entirety of both wounds were irrigated thoroughly.  The  medial side was packed and I turned attention to the fibula.  Here, the chronic draining sinus was completely excised with a 15 blade from skin down to the completely through the deep tissues.  I introduced a guidewire into the screw that was within the fibula and withdrew it without difficulty.  The head was  somewhat loose, but the threads seemed fixed.  Once this was out, I then inserted the guidewire and performed intramedullary reaming to partially excise the fibula going from 5 mm to 6 mm to 7 mm.  It was then irrigated thoroughly.  I did curette the  area between the draining sinus and the tip of the fibula.  Once this was complete, a fresh drapes were applied to the entirety of the field and fresh gloves.  A 2 x 2 packing was placed into the fibula and then after a loose layered closure with 2-0 PDS  and widely spaced a 2-0 nylon Mepitel and a gauze was applied with a gently  compressive dressing.  The patient was taken to PACU in stable condition.  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  Neither a sed rate or CRP were elevated, which gave her ability to follow  those for resolution of the infection.  Fortunately, his hemoglobin A1c is 5.3, which shows good sugar control and should help [place] with eradication of the infection, which will depend upon the results of his cultures.  We have contacted our infectious  disease colleagues and look forward to their recommendations and further management.  We did not anticipate further surgery, but with all the hardware removed, should it be necessary to obtain an MRI with gadolinium in the future.  This would be feasible  with high degree of sensitivity and specificity. He will be on broad spectrum antibiotics initially.   PUS D: 03/29/2023 11:31:27 am  T: 03/29/2023 2:56:00 pm  JOB: [id number]/ [id number]

## 2023-03-29 NOTE — Progress Notes (Addendum)
VIDIT, BEAUBRUN ([id number]) [id number] Page 1 of 6 Visit Report for 03/26/2023 Arrival Information Details Patient Name: Date of Service: [name] [name] 03/26/2023 1:15 PM Medical Record Number: [id number] Patient Account Number: [id number] Date of Birth/Sex: Treating RN: [name] [name] (62 y.o. [name] [name] Primary Care Milferd Ansell: Kaleen Mask Other Clinician: Referring Haseeb Fiallos: Treating Martise Waddell/Extender: Riki Sheer in Treatment: 6 Visit Information History Since Last Visit All ordered tests and consults were completed: Yes Patient Arrived: Wheel Chair Added or deleted any medications: No Arrival Time: 13:25 Any new allergies or adverse reactions: No Accompanied By: wife Had a fall or experienced change in No Transfer Assistance: None activities of daily living that may affect Patient Identification Verified: Yes risk of falls: Secondary Verification Process Completed: Yes Signs or symptoms of abuse/neglect since last visito No Patient Requires Transmission-Based Precautions: No Hospitalized since last visit: No Patient Has Alerts: No Implantable device outside of the clinic excluding No cellular tissue based products placed in the center since last visit: Pain Present Now: Yes Electronic Signature(s) Signed: 03/29/2023 1:56:54 PM By: Brenton Grills Entered By: Brenton Grills on 03/26/2023 13:27:00 -------------------------------------------------------------------------------- Compression Therapy Details Patient Name: Date of Service: [name] [name] 03/26/2023 1:15 PM Medical Record Number: [id number] Patient Account Number: [id number] Date of Birth/Sex: Treating RN: [birth year] (62 y.o. [name] [name] Primary Care Tenzin Edelman: Kaleen Mask Other Clinician: Referring Daimion Adamcik: Treating Alexiz Sustaita/Extender: Katheren Puller Weeks in Treatment: 6 Compression Therapy  Performed for Wound Assessment: Wound #1 Left,Lateral Ankle Performed By: Clinician Brenton Grills, RN Compression Type: Double Layer Post Procedure Diagnosis Same as Pre-procedure Electronic Signature(s) Signed: 03/29/2023 1:56:54 PM By: Brenton Grills Entered By: Brenton Grills on 03/26/2023 14:18:03 -------------------------------------------------------------------------------- Encounter Discharge Information Details Patient Name: Date of Service: [name] [name] 03/26/2023 1:15 PM Medical Record Number: [id number] Patient Account Number: [id number] Date of Birth/Sex: Treating RN: [birth year], [name] [name] (62 y.o. [name] [name] Primary Care Roshni Burbano: Kaleen Mask Other Clinician: Referring Tiffannie Sloss: Treating Princesa Willig/Extender: Riki Sheer in Treatment: 6 Encounter Discharge Information Items Discharge Condition: Stable Ambulatory Status: Wheelchair Discharge Destination: Home Transportation: Private Auto Accompanied By: wife [name] [name], [name] [name] ([id number]) [id number] Page 2 of 6 Schedule Follow-up Appointment: No Clinical Summary of Care: Patient Declined Electronic Signature(s) Signed: 03/29/2023 1:56:54 PM By: Brenton Grills Entered By: Brenton Grills on 03/26/2023 14:23:18 -------------------------------------------------------------------------------- Lower Extremity Assessment Details Patient Name: Date of Service: [name] [name] 03/26/2023 1:15 PM Medical Record Number: [id number] Patient Account Number: [id number] Date of Birth/Sex: Treating RN: [birth year] (62 y.o. [name] [name] Primary Care Adithi Gammon: Kaleen Mask Other Clinician: Referring Geraldina Parrott: Treating Dannica Bickham/Extender: Katheren Puller Weeks in Treatment: 6 Edema Assessment Assessed: [Left: No] [Right: No] Edema: [Left: Ye] [Right: s] Calf Left: Right: Point of Measurement: From Medial Instep 34  cm Ankle Left: Right: Point of Measurement: From Medial Instep 29.1 cm Vascular Assessment Pulses: Dorsalis Pedis Palpable: [Left:Yes] Electronic Signature(s) Signed: 03/29/2023 1:56:54 PM By: Brenton Grills Entered By: Brenton Grills on 03/26/2023 13:34:31 -------------------------------------------------------------------------------- Multi Wound Chart Details Patient Name: Date of Service: [name] [name] 03/26/2023 1:15 PM Medical Record Number: [id number] Patient Account Number: [id number] Date of Birth/Sex: Treating RN: [name] (62 y.o. M) Primary Care Maghan Jessee: Kaleen Mask Other Clinician: Referring Tagg Eustice: Treating Reana Chacko/Extender: Katheren Puller Weeks in Treatment: 6 Vital Signs Height(in): 70 Pulse(bpm): 108 Weight(lbs): 196 Blood Pressure(mmHg): 127/86 Body Mass Index(BMI): 28.1 Temperature(F): 98.5 Respiratory Rate(breaths/min): 18 [1:Photos:] [N/A:N/A] Left, Lateral Ankle  N/A N/A Wound Location: Shear/Friction N/A N/A Wounding Event: Diabetic Wound/Ulcer of the Lower N/A N/A Primary Etiology: Extremity Abscess N/A N/A Secondary Etiology: Anemia, Hypertension, Type II N/A N/A Comorbid History: Diabetes, Neuropathy 12/16/2019 N/A N/A Date Acquired: 6 N/A N/A Weeks of Treatment: Open N/A N/A Wound Status: No N/A N/A Wound Recurrence: 0.6x0.3x1.2 N/A N/A Measurements L x W x D (cm) 0.141 N/A N/A A (cm) : rea 0.17 N/A N/A Volume (cm) : 28.10% N/A N/A % Reduction in A rea: -44.10% N/A N/A % Reduction in Volume: Grade 2 N/A N/A Classification: Medium N/A N/A Exudate A mount: Serosanguineous N/A N/A Exudate Type: red, brown N/A N/A Exudate Color: Well defined, not attached N/A N/A Wound Margin: Large (67-100%) N/A N/A Granulation A mount: Red N/A N/A Granulation Quality: None Present (0%) N/A N/A Necrotic A mount: Fat Layer (Subcutaneous Tissue): Yes N/A N/A Exposed Structures: Fascia:  No Tendon: No Muscle: No Bone: No Small (1-33%) N/A N/A Epithelialization: Scarring: Yes N/A N/A Periwound Skin Texture: Maceration: No N/A N/A Periwound Skin Moisture: Dry/Scaly: No Rubor: No N/A N/A Periwound Skin Color: No Abnormality N/A N/A Temperature: Yes N/A N/A Tenderness on Palpation: Treatment Notes Electronic Signature(s) Signed: 03/26/2023 2:03:12 PM By: Duanne Guess MD FACS Entered By: Duanne Guess on 03/26/2023 14:03:11 -------------------------------------------------------------------------------- Multi-Disciplinary Care Plan Details Patient Name: Date of Service: [name] [name] 03/26/2023 1:15 PM Medical Record Number: [id number] Patient Account Number: [id number] Date of Birth/Sex: Treating RN: [birth year] (62 y.o. [name] [name] Primary Care Nilay Mangrum: Kaleen Mask Other Clinician: Referring Krystan Northrop: Treating Nusrat Encarnacion/Extender: Katheren Puller Weeks in Treatment: 6 Active Inactive Electronic Signature(s) Signed: 04/03/2023 4:38:45 PM By: Shawn Stall RN, BSN Signed: 05/22/2023 7:48:04 AM By: Brenton Grills Previous Signature: 03/29/2023 1:56:54 PM Version By: Brenton Grills Entered By: Shawn Stall on 04/03/2023 16:38:45 -------------------------------------------------------------------------------- Pain Assessment Details Patient Name: Date of Service: [name] [name] 03/26/2023 1:15 PM Medical Record Number: [id number] Patient Account Number: [id number] Date of Birth/Sex: Treating RN: [name] [name], [name] [name] (62 y.o. [name] [name] Primary Care Josef Tourigny: Kaleen Mask Other Clinician: Referring Gaetan Spieker: Treating Ambrosio Reuter/Extender: Riki Sheer in Treatment: 6 [name] [name], [name] [name] ([id number]) [id number] Page 4 of 6 Active Problems Location of Pain Severity and Description of Pain Patient Has Paino Yes Site Locations Pain Location: Generalized  Pain Rate the pain. Current Pain Level: 4 Pain Management and Medication Current Pain Management: Electronic Signature(s) Signed: 03/29/2023 1:56:54 PM By: Brenton Grills Entered By: Brenton Grills on 03/26/2023 13:29:19 -------------------------------------------------------------------------------- Patient/Caregiver Education Details Patient Name: Date of Service: [name] [name] 4/15/2024andnbsp1:15 PM Medical Record Number: [id number] Patient Account Number: [id number] Date of Birth/Gender: Treating RN: [name] (62 y.o. [name] [name] Primary Care Physician: Kaleen Mask Other Clinician: Referring Physician: Treating Physician/Extender: Riki Sheer in Treatment: 6 Education Assessment Education Provided To: Patient and Caregiver Education Topics Provided Wound/Skin Impairment: Methods: Explain/Verbal Responses: State content correctly Electronic Signature(s) Signed: 03/29/2023 1:56:54 PM By: Brenton Grills Entered By: Brenton Grills on 03/26/2023 13:47:37 -------------------------------------------------------------------------------- Wound Assessment Details Patient Name: Date of Service: [name] [name] 03/26/2023 1:15 PM Medical Record Number: [id number] Patient Account Number: [id number] Date of Birth/Sex: Treating RN: [name] [name] (62 y.o. [name] [name] Primary Care Kimberlyn Quiocho: Kaleen Mask Other Clinician: Referring Deryn Massengale: Treating Yolanda Dockendorf/Extender: Katheren Puller Weeks in Treatment: 6 Wound Status [name] [name], [name] [name] ([id number]) [id number] Page 5 of 6 Wound Number: 1 Primary Etiology: Diabetic Wound/Ulcer of the Lower Extremity Wound Location: Left, Lateral Ankle  Secondary Etiology: Abscess Wounding Event: Shear/Friction Wound Status: Open Date Acquired: 12/16/2019 Comorbid History: Anemia, Hypertension, Type II Diabetes, Neuropathy Weeks Of  Treatment: 6 Clustered Wound: No Photos Wound Measurements Length: (cm) 0.6 Width: (cm) 0.3 Depth: (cm) 1.2 Area: (cm) 0.141 Volume: (cm) 0.17 % Reduction in Area: 28.1% % Reduction in Volume: -44.1% Epithelialization: Small (1-33%) Tunneling: No Undermining: No Wound Description Classification: Grade 2 Wound Margin: Well defined, not attached Exudate Amount: Medium Exudate Type: Serosanguineous Exudate Color: red, brown Foul Odor After Cleansing: No Slough/Fibrino Yes Wound Bed Granulation Amount: Large (67-100%) Exposed Structure Granulation Quality: Red Fascia Exposed: No Necrotic Amount: None Present (0%) Fat Layer (Subcutaneous Tissue) Exposed: Yes Tendon Exposed: No Muscle Exposed: No Bone Exposed: No Periwound Skin Texture Texture Color No Abnormalities Noted: No No Abnormalities Noted: Yes Scarring: Yes Temperature / Pain Temperature: No Abnormality Moisture No Abnormalities Noted: No Tenderness on Palpation: Yes Dry / Scaly: No Maceration: No Electronic Signature(s) Signed: 03/29/2023 1:56:54 PM By: Brenton Grills Entered By: Brenton Grills on 03/26/2023 13:44:29 -------------------------------------------------------------------------------- Vitals Details Patient Name: Date of Service: [name] [name], [name] [name] 03/26/2023 1:15 PM Medical Record Number: [id number] Patient Account Number: [id number] Date of Birth/Sex: Treating RN: [birth year], [name] [name] (62 y.o. [name] [name] Primary Care Lova Urbieta: Kaleen Mask Other Clinician: Referring Javarie Crisp: Treating Katerin Negrete/Extender: Katheren Puller Weeks in Treatment: 6 Vital Signs Time Taken: 13:27 Temperature (F): 98.5 Height (in): 70 Pulse (bpm): 108 Weight (lbs): 196 Respiratory Rate (breaths/min): 18 [name] [name], [name] [name] ([id number]) [id number] Page 6 of 6 Body Mass Index (BMI): 28.1 Blood Pressure (mmHg): 127/86 Reference Range: 80 - 120 mg / dl Electronic  Signature(s) Signed: 03/29/2023 1:56:54 PM By: Brenton Grills Entered By: Brenton Grills on 03/26/2023 13:29:04

## 2023-03-29 NOTE — Progress Notes (Signed)
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 [place], [place] [address] Phone: [phone]  Fax: [phone]

## 2023-03-29 NOTE — Brief Op Note (Signed)
03/29/2023  11:16 AM  PATIENT:  [name] [name]  62 y.o. male  PRE-OPERATIVE DIAGNOSIS:   1. CHRONIC OSTEOMYELITIS WITH DRAINING SINUS LEFT FIBULA 2. CHRONIC OSTEOMYELITIS LEFT TIBIA  [id number]

## 2023-03-30 ENCOUNTER — Other Ambulatory Visit: Payer: Self-pay

## 2023-03-30 ENCOUNTER — Encounter (HOSPITAL_COMMUNITY): Payer: Self-pay | Admitting: Orthopedic Surgery

## 2023-03-30 DIAGNOSIS — T847XXA Infection and inflammatory reaction due to other internal orthopedic prosthetic devices, implants and grafts, initial encounter: Secondary | ICD-10-CM

## 2023-03-30 DIAGNOSIS — Z79899 Other long term (current) drug therapy: Secondary | ICD-10-CM | POA: Diagnosis not present

## 2023-03-30 DIAGNOSIS — M86662 Other chronic osteomyelitis, left tibia and fibula: Secondary | ICD-10-CM

## 2023-03-30 LAB — GLUCOSE, CAPILLARY
Glucose-Capillary: 117 mg/dL — ABNORMAL HIGH (ref 70–99)
Glucose-Capillary: 102 mg/dL — ABNORMAL HIGH (ref 70–99)
Glucose-Capillary: 142 mg/dL — ABNORMAL HIGH (ref 70–99)
Glucose-Capillary: 114 mg/dL — ABNORMAL HIGH (ref 70–99)

## 2023-03-30 LAB — AEROBIC/ANAEROBIC CULTURE W GRAM STAIN (SURGICAL/DEEP WOUND): Culture: NO GROWTH

## 2023-03-30 LAB — CK: Total CK: 80 U/L (ref 49–397)

## 2023-03-30 MED ORDER — ACETAMINOPHEN 325 MG PO TABS: 325.0000 mg | ORAL_TABLET | Freq: Four times a day (QID) | ORAL | 0 refills | Status: AC | PRN

## 2023-03-30 MED ORDER — DOCUSATE SODIUM 100 MG PO CAPS: 100.0000 mg | ORAL_CAPSULE | Freq: Two times a day (BID) | ORAL | 0 refills | Status: AC

## 2023-03-30 MED ORDER — ASPIRIN 325 MG PO TBEC: 325.0000 mg | DELAYED_RELEASE_TABLET | Freq: Every day | ORAL | 0 refills | Status: AC

## 2023-03-30 MED ORDER — DAPTOMYCIN IV (FOR PTA / DISCHARGE USE ONLY)
700.0000 mg | INTRAVENOUS | 0 refills | Status: DC
Start: 2023-03-30 — End: 2023-04-03

## 2023-03-30 MED ORDER — SODIUM CHLORIDE 0.9% FLUSH: 10.0000 mL | INTRAVENOUS | Status: DC | PRN

## 2023-03-30 MED ORDER — CHLORHEXIDINE GLUCONATE CLOTH 2 % EX PADS
6.0000 | MEDICATED_PAD | Freq: Every day | CUTANEOUS | Status: DC
  Administered 2023-03-30: 6 via TOPICAL

## 2023-03-30 MED ORDER — ORAL CARE MOUTH RINSE: 15.0000 mL | OROMUCOSAL | Status: DC | PRN

## 2023-03-30 MED ORDER — CEFEPIME IV (FOR PTA / DISCHARGE USE ONLY)
2.0000 g | Freq: Three times a day (TID) | INTRAVENOUS | 0 refills | Status: DC
Start: 2023-03-30 — End: 2023-04-03

## 2023-03-30 MED ORDER — HYDROCODONE-ACETAMINOPHEN 7.5-325 MG PO TABS: 1.0000 | ORAL_TABLET | Freq: Three times a day (TID) | ORAL | 0 refills | Status: AC | PRN

## 2023-03-30 NOTE — Plan of Care (Signed)
Patient alert and oriented. Discharge teaching given. IV access taken out.  Problem: Education: Goal: Ability to describe self-care measures that may prevent or decrease complications (Diabetes Survival Skills Education) will improve Outcome: Adequate for Discharge Goal: Individualized Educational Video(s) Outcome: Adequate for Discharge   Problem: Coping: Goal: Ability to adjust to condition or change in health will improve Outcome: Adequate for Discharge   Problem: Fluid Volume: Goal: Ability to maintain a balanced intake and output will improve Outcome: Adequate for Discharge   Problem: Health Behavior/Discharge Planning: Goal: Ability to identify and utilize available resources and services will improve Outcome: Adequate for Discharge Goal: Ability to manage health-related needs will improve Outcome: Adequate for Discharge   Problem: Metabolic: Goal: Ability to maintain appropriate glucose levels will improve Outcome: Adequate for Discharge   Problem: Nutritional: Goal: Maintenance of adequate nutrition will improve Outcome: Adequate for Discharge Goal: Progress toward achieving an optimal weight will improve Outcome: Adequate for Discharge   Problem: Skin Integrity: Goal: Risk for impaired skin integrity will decrease Outcome: Adequate for Discharge   Problem: Tissue Perfusion: Goal: Adequacy of tissue perfusion will improve Outcome: Adequate for Discharge   Problem: Education: Goal: Knowledge of General Education information will improve Description: Including pain rating scale, medication(s)/side effects and non-pharmacologic comfort measures Outcome: Adequate for Discharge   Problem: Health Behavior/Discharge Planning: Goal: Ability to manage health-related needs will improve Outcome: Adequate for Discharge   Problem: Clinical Measurements: Goal: Ability to maintain clinical measurements within normal limits will improve Outcome: Adequate for Discharge Goal:  Will remain free from infection Outcome: Adequate for Discharge Goal: Diagnostic test results will improve Outcome: Adequate for Discharge Goal: Respiratory complications will improve Outcome: Adequate for Discharge Goal: Cardiovascular complication will be avoided Outcome: Adequate for Discharge   Problem: Activity: Goal: Risk for activity intolerance will decrease Outcome: Adequate for Discharge   Problem: Nutrition: Goal: Adequate nutrition will be maintained Outcome: Adequate for Discharge   Problem: Coping: Goal: Level of anxiety will decrease Outcome: Adequate for Discharge   Problem: Elimination: Goal: Will not experience complications related to bowel motility Outcome: Adequate for Discharge Goal: Will not experience complications related to urinary retention Outcome: Adequate for Discharge   Problem: Pain Managment: Goal: General experience of comfort will improve Outcome: Adequate for Discharge   Problem: Safety: Goal: Ability to remain free from injury will improve Outcome: Adequate for Discharge   Problem: Skin Integrity: Goal: Risk for impaired skin integrity will decrease Outcome: Adequate for Discharge

## 2023-03-30 NOTE — Anesthesia Postprocedure Evaluation (Signed)
Anesthesia Post Note  Patient: Mansa Willers  Procedure(s) Performed: HARDWARE REMOVAL ON ANKLE (Left)     Patient location during evaluation: PACU Anesthesia Type: General Level of consciousness: awake and alert Pain management: pain level controlled Vital Signs Assessment: post-procedure vital signs reviewed and stable Respiratory status: spontaneous breathing, nonlabored ventilation, respiratory function stable and patient connected to nasal cannula oxygen Cardiovascular status: blood pressure returned to baseline and stable Postop Assessment: no apparent nausea or vomiting Anesthetic complications: no   No notable events documented.  Last Vitals:  Vitals:   03/29/23 2002 03/30/23 0502  BP: 120/84 113/81  Pulse: 79 82  Resp: 18 18  Temp: 36.7 C 36.5 C  SpO2: 100% 100%    Last Pain:  Vitals:   03/30/23 0649  TempSrc:   PainSc: Asleep                 Collene Schlichter

## 2023-03-30 NOTE — Discharge Instructions (Addendum)
Orthopaedic Trauma Service Discharge Instructions   General Discharge Instructions  Orthopaedic Injuries:  Osteomyelitis left tibia and fibula treated with removal of hardware and debridement of bone.  You are also on IV antibiotics to treat the infection  WEIGHT BEARING STATUS: Weight-bear as tolerated left leg use walker or crutches as needed  RANGE OF MOTION/ACTIVITY: Unrestricted range of motion left ankle.  Activity as tolerated.  Slowly increase activity level   Wound Care: Daily wound care as needed starting on 04/01/2023.  Please see below  Discharge Wound Care Instructions  Do NOT apply any ointments, solutions or lotions to pin sites or surgical wounds.  These prevent needed drainage and even though solutions like hydrogen peroxide kill bacteria, they also damage cells lining the pin sites that help fight infection.  Applying lotions or ointments can keep the wounds moist and can cause them to breakdown and open up as well. This can increase the risk for infection. When in doubt call the office.  Surgical incisions should be dressed daily.  If any drainage is noted, use one layer of adaptic or Mepitel, then gauze, Kerlix, and an ace wrap.  [email or web address].cz [email or web address]-[email or web address]/?pd_rd_[email or web address]&th=1  [email or web address]  These dressing supplies should be available at local medical supply stores ([employer], [employer], etc). They are not usually carried at places like [facility], [facility], [facility], etc  Once the incision is completely dry and without drainage, it may be left open to air out.  Showering may begin 36-48 hours later.  Cleaning gently with soap and water.  Traumatic wounds should be dressed daily as well.    One layer of adaptic, gauze, Kerlix, then ace wrap.  The  adaptic can be discontinued once the draining has ceased    If you have a wet to dry dressing: wet the gauze with saline the squeeze as much saline out so the gauze is moist (not soaking wet), place moistened gauze over wound, then place a dry gauze over the moist one, followed by Kerlix wrap, then ace wrap.   DVT/PE prophylaxis: Aspirin 325 mg daily for 30 days  Diet: as you were eating previously.  Can use over the counter stool softeners and bowel preparations, such as Miralax, to help with bowel movements.  Narcotics can be constipating.  Be sure to drink plenty of fluids  PAIN MEDICATION USE AND EXPECTATIONS  You have likely been given narcotic medications to help control your pain.  After a traumatic event that results in an fracture (broken bone) with or without surgery, it is ok to use narcotic pain medications to help control one's pain.  We understand that everyone responds to pain differently and each individual patient will be evaluated on a regular basis for the continued need for narcotic medications. Ideally, narcotic medication use should last no more than 6-8 weeks (coinciding with fracture healing).   As a patient it is your responsibility as well to monitor narcotic medication use and report the amount and frequency you use these medications when you come to your office visit.   We would also advise that if you are using narcotic medications, you should take a dose prior to therapy to maximize you participation.  IF YOU ARE ON NARCOTIC MEDICATIONS IT IS NOT PERMISSIBLE TO OPERATE A MOTOR VEHICLE (MOTORCYCLE/CAR/TRUCK/MOPED) OR HEAVY MACHINERY DO NOT MIX NARCOTICS WITH OTHER CNS (CENTRAL NERVOUS SYSTEM) DEPRESSANTS SUCH AS ALCOHOL   POST-OPERATIVE OPIOID TAPER INSTRUCTIONS: It is important to wean off of your opioid medication as soon as possible. If  you do not need pain medication after your surgery it is ok to stop day one. Opioids include: Codeine, Hydrocodone(Norco,  Vicodin), Oxycodone(Percocet, oxycontin) and hydromorphone amongst others.  Long term and even short term use of opiods can cause: Increased pain response Dependence Constipation Depression Respiratory depression And more.  Withdrawal symptoms can include Flu like symptoms Nausea, vomiting And more Techniques to manage these symptoms Hydrate well Eat regular healthy meals Stay active Use relaxation techniques(deep breathing, meditating, yoga) Do Not substitute Alcohol to help with tapering If you have been on opioids for less than two weeks and do not have pain than it is ok to stop all together.  Plan to wean off of opioids This plan should start within one week post op of your fracture surgery  Maintain the same interval or time between taking each dose and first decrease the dose.  Cut the total daily intake of opioids by one tablet each day Next start to increase the time between doses. The last dose that should be eliminated is the evening dose.    STOP SMOKING OR USING NICOTINE PRODUCTS!!!!  As discussed nicotine severely impairs your body's ability to heal surgical and traumatic wounds but also impairs bone healing.  Wounds and bone heal by forming microscopic blood vessels (angiogenesis) and nicotine is a vasoconstrictor (essentially, shrinks blood vessels).  Therefore, if vasoconstriction occurs to these microscopic blood vessels they essentially disappear and are unable to deliver necessary nutrients to the healing tissue.  This is one modifiable factor that you can do to dramatically increase your chances of healing your injury.    (This means no smoking, no nicotine gum, patches, etc)  DO NOT USE NONSTEROIDAL ANTI-INFLAMMATORY DRUGS (NSAID'S)  Using products such as Advil (ibuprofen), Aleve (naproxen), Motrin (ibuprofen) for additional pain control during fracture healing can delay and/or prevent the healing response.  If you would like to take over the counter (OTC)  medication, Tylenol (acetaminophen) is ok.  However, some narcotic medications that are given for pain control contain acetaminophen as well. Therefore, you should not exceed more than 4000 mg of tylenol in a day if you do not have liver disease.  Also note that there are may OTC medicines, such as cold medicines and allergy medicines that my contain tylenol as well.  If you have any questions about medications and/or interactions please ask your doctor/PA or your pharmacist.      ICE AND ELEVATE INJURED/OPERATIVE EXTREMITY  Using ice and elevating the injured extremity above your heart can help with swelling and pain control.  Icing in a pulsatile fashion, such as 20 minutes on and 20 minutes off, can be followed.    Do not place ice directly on skin. Make sure there is a barrier between to skin and the ice pack.    Using frozen items such as frozen peas works well as the conform nicely to the are that needs to be iced.  USE AN ACE WRAP OR TED HOSE FOR SWELLING CONTROL  In addition to icing and elevation, Ace wraps or TED hose are used to help limit and resolve swelling.  It is recommended to use Ace wraps or TED hose until you are informed to stop.    When using Ace Wraps start the wrapping distally (farthest away from the body) and wrap proximally (closer to the body)   Example: If you had surgery on your leg or thing and you do not have a splint on, start the ace wrap at the  toes and work your way up to the thigh        If you had surgery on your upper extremity and do not have a splint on, start the ace wrap at your fingers and work your way up to the upper arm  IF YOU ARE IN A SPLINT OR CAST DO NOT REMOVE IT FOR ANY REASON   If your splint gets wet for any reason please contact the office immediately. You may shower in your splint or cast as long as you keep it dry.  This can be done by wrapping in a cast cover or garbage back (or similar)  Do Not stick any thing down your splint or cast such  as pencils, money, or hangers to try and scratch yourself with.  If you feel itchy take benadryl as prescribed on the bottle for itching  IF YOU ARE IN A CAM BOOT (BLACK BOOT)  You may remove boot periodically. Perform daily dressing changes as noted below.  Wash the liner of the boot regularly and wear a sock when wearing the boot. It is recommended that you sleep in the boot until told otherwise    Call office for the following: Temperature greater than 101F Persistent nausea and vomiting Severe uncontrolled pain Redness, tenderness, or signs of infection (pain, swelling, redness, odor or green/yellow discharge around the site) Difficulty breathing, headache or visual disturbances Hives Persistent dizziness or light-headedness Extreme fatigue Any other questions or concerns you may have after discharge  In an emergency, call 911 or go to an Emergency Department at a nearby hospital  HELPFUL INFORMATION  If you had a block, it will wear off between 8-24 hrs postop typically.  This is period when your pain may go from nearly zero to the pain you would have had postop without the block.  This is an abrupt transition but nothing dangerous is happening.  You may take an extra dose of narcotic when this happens.  You should wean off your narcotic medicines as soon as you are able.  Most patients will be off or using minimal narcotics before their first postop appointment.   We suggest you use the pain medication the first night prior to going to bed, in order to ease any pain when the anesthesia wears off. You should avoid taking pain medications on an empty stomach as it will make you nauseous.  Do not drink alcoholic beverages or take illicit drugs when taking pain medications.  In most states it is against the law to drive while you are in a splint or sling.  And certainly against the law to drive while taking narcotics.  You may return to work/school in the next couple of days when  you feel up to it.   Pain medication may make you constipated.  Below are a few solutions to try in this order: Decrease the amount of pain medication if you aren't having pain. Drink lots of decaffeinated fluids. Drink prune juice and/or each dried prunes  If the first 3 don't work start with additional solutions Take Colace - an over-the-counter stool softener Take Senokot - an over-the-counter laxative Take Miralax - a stronger over-the-counter laxative     CALL THE OFFICE WITH ANY QUESTIONS OR CONCERNS: [phone]   VISIT OUR WEBSITE FOR ADDITIONAL INFORMATION: [email or web address]

## 2023-03-30 NOTE — Progress Notes (Signed)
PHARMACY CONSULT NOTE FOR:  OUTPATIENT  PARENTERAL ANTIBIOTIC THERAPY (OPAT)  Indication: LLE osteo Regimen: Daptomycin 700 mg IV every 24 hours and Cefepime 2g IV every 8 hours End date: 05/10/23 (6 weeks from OR 03/29/23)  IV antibiotic discharge orders are pended. To discharging provider:  please sign these orders via discharge navigator,  Select New Orders & click on the button choice - Manage This Unsigned Work.     Thank you for allowing pharmacy to be a part of this patient's care.  Georgina Pillion, PharmD, BCPS Infectious Diseases Clinical Pharmacist 03/30/2023 9:17 AM   **Pharmacist phone directory can now be found on [email or web address] ([email or web address]).  Listed under [facility].

## 2023-03-30 NOTE — Progress Notes (Signed)
OT Cancellation Note  Patient Details Name: [name] [name] MRN: [id number] DOB: 03-29-1961   Cancelled Treatment:    Reason Eval/Treat Not Completed: Other (comment) Pt working with PT currently. Will follow up.  Lorre Munroe 03/30/2023, 9:02 AM

## 2023-03-30 NOTE — Progress Notes (Signed)
Orthopaedic Trauma Service Progress Note  Patient ID: [name] [name] MRN: [id number] DOB/AGE: March 05, 1961 62 y.o.  Subjective:  Doing quite well this morning Minimal pain Has been sitting in the chair for few hours No real complaints Very appreciative of his care  Intraoperative cultures showing some gram positive cocci preliminarily  Appreciate infectious disease assistance   Sed rate and CRP are within normal limits on admission 3 and <0.5 respectively  ROS As above Objective:   VITALS:   Vitals:   03/29/23 1349 03/29/23 2002 03/30/23 0502 03/30/23 0757  BP: 126/88 120/84 113/81 107/71  Pulse: 80 79 82 78  Resp: Temp: (!) 97.5 F (36.4 C) 98 F (36.7 C) 97.7 F (36.5 C) 97.8 F (36.6 C)  TempSrc: Oral Oral Oral Oral  SpO2: 100% 100% 100% 100%  Weight:      Height:        Estimated body mass index is 28.41 kg/m as calculated from the following:   Height as of this encounter:  (1.778 m).   Weight as of this encounter: 89.8 kg.   Intake/Output      04/18 0701 04/19 0700 04/19 0701 04/20 0700   P.O. 120    I.V. (mL/kg) 1572.5 (17.5)    IV Piggyback 347.2    Total Intake(mL/kg) 2039.7 (22.7)    Urine (mL/kg/hr) 1000 (0.5)    Stool 0    Blood 100    Total Output 1100    Net +939.7           LABS  Results for orders placed or performed during the hospital encounter of 03/29/23 (from the past 24 hour(s))  Aerobic/Anaerobic Culture w Gram Stain (surgical/deep wound)     Status: None (Preliminary result)   Collection Time: 03/29/23 10:11 AM   Specimen: Foot, Left; Body Fluid  Result Value Ref Range   Specimen Description OTHER    Special Requests  SCREW    Gram Stain NO WBC SEEN FEW GRAM POSITIVE COCCI     Culture      CULTURE REINCUBATED FOR BETTER GROWTH Performed at [facility], [address], [place], [place] [address]    Report Status  PENDING   Glucose, capillary     Status: Abnormal   Collection Time: 03/29/23 10:59 AM  Result Value Ref Range   Glucose-Capillary 117 (H) 70 - 99 mg/dL  Glucose, capillary     Status: Abnormal   Collection Time: 03/29/23  4:45 PM  Result Value Ref Range   Glucose-Capillary 227 (H) 70 - 99 mg/dL  Glucose, capillary     Status: Abnormal   Collection Time: 03/29/23  8:01 PM  Result Value Ref Range   Glucose-Capillary 239 (H) 70 - 99 mg/dL  CK     Status: None   Collection Time: 03/30/23  5:49 AM  Result Value Ref Range   Total CK 80 49 - 397 U/L  Glucose, capillary     Status: Abnormal   Collection Time: 03/30/23  7:55 AM  Result Value Ref Range   Glucose-Capillary 102 (H) 70 - 99 mg/dL     PHYSICAL EXAM:   Gen: sitting up in chair, pleasant, NAD, looks good  Lungs: Unlabored Cardiac: Regular Ext:       Left lower extremity  Dressing  is clean, dry and intact  Chronic edema appreciated  Distal motor and sensory functions are at baseline and intact  Extremity is warm  No acute findings noted   Assessment/Plan: 1 Day Post-Op   Principal Problem:   Osteomyelitis of left tibia   Anti-infectives (From admission, onward)    Start     Dose/Rate Route Frequency Ordered Stop   03/29/23 1600  DAPTOmycin (CUBICIN) 700 mg in sodium chloride 0.9 % IVPB        8 mg/kg  89.8 kg 128 mL/hr over 30 Minutes Intravenous Daily 03/29/23 1359     03/29/23 1445  ceFEPIme (MAXIPIME) 2 g in sodium chloride 0.9 % 100 mL IVPB        2 g 200 mL/hr over 30 Minutes Intravenous Every 8 hours 03/29/23 1359       .  POD/HD#: 77  62 year old male s/p ORIF complex left pilon fracture 2017 with chronic osteomyelitis left tibia and fibula s/p removal of hardware left tibia and fibula  -Removal of hardware left distal tibia and fibula for osteomyelitis s/p remote ORIF left pilon fracture Weightbearing Weight-bear as tolerated left leg.  May need to use crutches or  walker   ROM/Activity   Unrestricted range of motion of left ankle and knee   Wound care   Daily wound care starting on 04/01/2023.  Dressing changes as needed.   Therapy evaluations  Ice and elevate  - Pain management:  Multimodal   - ABL anemia/Hemodynamics  Stable - Medical issues   Chronic medical issues are stable including diabetes which is in much better control.  A1c on this admission is 5.3.  October 2023 it was 8.6  - DVT/PE prophylaxis:  Currently on Lovenox.  Discharged on aspirin 325 mg daily - ID:   Antibiotic selection per ID service   Currently on broad-spectrum coverage with cefepime and daptomycin   Will narrow down as cultures are finalized   PICC line to be placed today  - Activity:  As above - FEN/GI prophylaxis/Foley/Lines:  Carb modified diet  - Dispo:  Discharge home today if PICC line and home antibiotics can be arranged otherwise tomorrow    Mearl Latin, PA-C [phone] (C) 03/30/2023, 10:02 AM  [facility] [address] [place] [address] [phone] [phone] (F)    After 5pm and on the weekends please log on to Amion, go to orthopaedics and the look under the [facility] Call for the provider(s) on call. You can also call our office at [phone] and then follow the prompts to be connected to the call team.  Patient ID: [name] [name], male   DOB: 1961/02/03, 62 y.o.   MRN: [id number]

## 2023-03-30 NOTE — TOC Transition Note (Signed)
Transition of Care [facility]) - CM/SW Discharge Note   Patient Details  Name: [name] [name] MRN: [id number] Date of Birth: May 26, 1961  [facility]) CM/SW Contact:  Gordy Clement, RN Phone Number: 03/30/2023, 12:47 PM   Clinical Narrative:     Patient will DC to home with Wife.  [facility] will provide IV ABX , education supplies etc. and [facility] will provide [facility] for care.  Patient will go to OPPT afte IV ABX infusion is completed   Referral has been made.  Wife to transport home at DC after education        Barriers to Discharge: Continued Medical Work up   Patient Goals and CMS Choice CMS Medicare.gov Compare Post Acute Care list provided to:: Patient Choice offered to / list presented to : Patient  Discharge Placement                         Discharge Plan and Services Additional resources added to the After Visit Summary for   In-house Referral: Nutrition Discharge Planning Services: CM Consult Post Acute Care Choice: Home Health          DME Arranged: IV pump/equipment DME Agency: Other - Comment Julianne Rice) Date DME Agency Contacted: 03/30/23 Time DME Agency Contacted: 0900 Representative spoke with at DME Agency: Jeri Modena HH Arranged: RN HH Agency: [facility], Other - See comment Human resources officer) Date HH Agency Contacted: 03/30/23 Time HH Agency Contacted: 0900 Representative spoke with at [facility] Agency: Pam (Liaison) arranged  Social Determinants of Health (SDOH) Interventions SDOH Screenings   Food Insecurity: No Food Insecurity (03/29/2023)  Housing: Low Risk  (03/29/2023)  Transportation Needs: No Transportation Needs (03/29/2023)  Utilities: Not At Risk (03/29/2023)  Depression (PHQ2-9): Low Risk  (03/22/2023)  Tobacco Use: Medium Risk (03/30/2023)     Readmission Risk Interventions     No data to display

## 2023-03-30 NOTE — Progress Notes (Signed)
Peripherally Inserted Central Catheter Placement  The IV Nurse has discussed with the patient and/or persons authorized to consent for the patient, the purpose of this procedure and the potential benefits and risks involved with this procedure.  The benefits include less needle sticks, lab draws from the catheter, and the patient may be discharged home with the catheter. Risks include, but not limited to, infection, bleeding, blood clot (thrombus formation), and puncture of an artery; nerve damage and irregular heartbeat and possibility to perform a PICC exchange if needed/ordered by physician.  Alternatives to this procedure were also discussed.  Bard Power PICC patient education guide, fact sheet on infection prevention and patient information card has been provided to patient /or left at bedside.    PICC Placement Documentation  PICC Single Lumen 03/30/23 Left Basilic 45 cm 0 cm (Active)  Indication for Insertion or Continuance of Line Home intravenous therapies (PICC only) 03/30/23 1128  Exposed Catheter (cm) 0 cm 03/30/23 1128  Site Assessment Clean, Dry, Intact 03/30/23 1128  Line Status Flushed;Saline locked;Blood return noted 03/30/23 1128  Dressing Type Transparent;Securing device 03/30/23 1128  Dressing Status Antimicrobial disc in place 03/30/23 1128  Safety Lock Intact 03/30/23 1128  Line Adjustment (NICU/IV Team Only) No 03/30/23 1128  Dressing Intervention New dressing;Other (Comment) 03/30/23 1128  Dressing Change Due 04/06/23 03/30/23 1128       Reginia Forts Albarece 03/30/2023, 11:30 AM

## 2023-03-30 NOTE — Evaluation (Signed)
Physical Therapy Evaluation Patient Details Name: [name] [name] MRN: [id number] DOB: [birth year] Today's Date: 03/30/2023  History of Present Illness  62 y.o. 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 Same day s/p removal of deep implants, left tibia and fibula and partial excision of left tibia and fibula.   PMH: diabetes.  Clinical Impression  Patient is s/p above surgery resulting in functional limitations due to the deficits listed below (see PT Problem List). Tolerated evaluation well. Has family support to assist at home, as needed. Pt does report falls with RW prior to admission. Reviewed AD use with RW for support to ambulate in room today, tolerating light WB through operative extremity, no buckling noted. Min guard for transfers. Lt foot drop present and apparently chronic since initial fracture in 2017 per pt report. Dicussed follow-up with orthotist for AFO. Will greatly benefit from OPPT follow-up for balance, gait, strength and distal ankle ROM. 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 Patient will benefit from acute skilled PT to increase their independence and safety with mobility to facilitate discharge.     Will benefit from orthotist consult for Lt AFO due to chronic foot drop.     Recommendations for follow up therapy are one component of a multi-disciplinary discharge planning process, led by the attending physician.  Recommendations may be updated based on patient status, additional functional criteria and insurance authorization.  Follow Up Recommendations   OPPT once cleared by surgeon - will benefit from orthotist consult for Lt AFO due to chronic foot drop.     Assistance Recommended at Discharge Intermittent Supervision/Assistance  Patient can return home with the following  A little help with walking and/or transfers;A little help with bathing/dressing/bathroom;Assistance with cooking/housework;Assist for transportation    Equipment  Recommendations None recommended by PT  Recommendations for Other Services       Functional Status Assessment Patient has had a recent decline in their functional status and demonstrates the ability to make significant improvements in function in a reasonable and predictable amount of time.     Precautions / Restrictions Precautions Precautions: Fall Restrictions Weight Bearing Restrictions: Yes LLE Weight Bearing: Weight bearing as tolerated      Mobility  Bed Mobility Overal bed mobility: Needs Assistance Bed Mobility: Supine to Sit     Supine to sit: Supervision     General bed mobility comments: Supervision for safety, slow to rise from bed and move LLE to EOB.    Transfers Overall transfer level: Needs assistance Equipment used: Rolling walker (2 wheels) Transfers: Sit to/from Stand Sit to Stand: Min guard           General transfer comment: Min guard for safety, unsteady, able to self correct with use of RW for support upon rising. Appropriate hand placement.    Ambulation/Gait Ambulation/Gait assistance: Supervision Gait Distance (Feet): 32 Feet Assistive device: Rolling walker (2 wheels) Gait Pattern/deviations: Step-to pattern, Decreased stride length, Decreased weight shift to left, Antalgic, Decreased dorsiflexion - left Gait velocity: decr Gait velocity interpretation: <1.31 ft/sec, indicative of household ambulator   General Gait Details: With notable drop foot on Lt, pt reports since 2017 incident. Supervision for safety, cues for RW use to reduce WB as needed. No buckling or overt LOB noted.  Stairs            Wheelchair Mobility    Modified Rankin (Stroke Patients Only)       Balance Overall balance assessment: Needs assistance Sitting-balance support: Feet supported, No  upper extremity supported Sitting balance-Leahy Scale: Good     Standing balance support: Bilateral upper extremity supported, Reliant on assistive device for  balance Standing balance-Leahy Scale: Poor                               Pertinent Vitals/Pain Pain Assessment Pain Assessment: Faces Faces Pain Scale: Hurts even more Pain Location: Lt ankle Pain Descriptors / Indicators: Aching Pain Intervention(s): Monitored during session, Repositioned, Limited activity within patient's tolerance    Home Living Family/patient expects to be discharged to:: Private residence Living Arrangements: Spouse/significant other;Children Available Help at Discharge: Family;Available PRN/intermittently (Nearly 24/7) Type of Home: House Home Access: Level entry       Home Layout: One level Home Equipment: Agricultural consultant (2 wheels);Shower seat;Wheelchair - manual;Cane - single point      Prior Function Prior Level of Function : History of Falls (last six months);Needs assist             Mobility Comments: Reports multiple falls, using RW. Wife assisted with getting into bed ADLs Comments: Wife would help dress     Hand Dominance        Extremity/Trunk Assessment   Upper Extremity Assessment Upper Extremity Assessment: Defer to OT evaluation    Lower Extremity Assessment Lower Extremity Assessment: LLE deficits/detail LLE Deficits / Details: Bandaged, guarding as expected post-op. Notable left ankle dorisflexion absence; pt reports since injury in 2017 LLE: Unable to fully assess due to pain       Communication   Communication: No difficulties  Cognition Arousal/Alertness: Awake/alert Behavior During Therapy: WFL for tasks assessed/performed Overall Cognitive Status: Within Functional Limits for tasks assessed                                          General Comments General comments (skin integrity, edema, etc.): Educated on LE elevation; discussed OP follow-up for AFO once cleared by surgeon. PT for impaired balance and recuring falls.    Exercises General Exercises - Lower Extremity Ankle  Circles/Pumps: AAROM, Both, 10 reps, Seated Quad Sets: Strengthening, Both, 10 reps, Seated (absent DF LT) Long Arc Quad: Strengthening, Both, 5 reps, Seated   Assessment/Plan    PT Assessment Patient needs continued PT services  PT Problem List Decreased strength;Decreased range of motion;Decreased activity tolerance;Decreased balance;Decreased mobility;Decreased knowledge of use of DME;Decreased knowledge of precautions;Impaired sensation;Pain       PT Treatment Interventions DME instruction;Gait training;Functional mobility training;Therapeutic activities;Therapeutic exercise;Balance training;Neuromuscular re-education;Patient/family education;Modalities    PT Goals (Current goals can be found in the Care Plan section)  Acute Rehab PT Goals Patient Stated Goal: Get well, not fall anymore PT Goal Formulation: With patient Time For Goal Achievement: 04/13/23 Potential to Achieve Goals: Good    Frequency Min 5X/week     Co-evaluation               AM-PAC PT "6 Clicks" Mobility  Outcome Measure Help needed turning from your back to your side while in a flat bed without using bedrails?: None Help needed moving from lying on your back to sitting on the side of a flat bed without using bedrails?: None Help needed moving to and from a bed to a chair (including a wheelchair)?: A Little Help needed standing up from a chair using your arms (e.g., wheelchair or bedside chair)?: A  Little Help needed to walk in hospital room?: A Little Help needed climbing 3-5 steps with a railing? : A Lot 6 Click Score: 19    End of Session Equipment Utilized During Treatment: Gait belt Activity Tolerance: Patient tolerated treatment well Patient left: in chair;with chair alarm set;with call bell/phone within reach;with SCD's reapplied (Ice applied to LLE)   PT Visit Diagnosis: Unsteadiness on feet (R26.81);Other abnormalities of gait and mobility (R26.89);Repeated falls (R29.6);History of  falling (Z91.81);Difficulty in walking, not elsewhere classified (R26.2);Pain Pain - Right/Left: Left Pain - part of body: Leg    Time: 1610-9604 PT Time Calculation (min) (ACUTE ONLY): 28 min   Charges:   PT Evaluation $PT Eval Low Complexity: 1 Low PT Treatments $Therapeutic Activity: 8-22 mins        Kathlyn Sacramento, PT, DPT Physical Therapist Acute Rehabilitation Services [facility] Outpatient Rehabilitation Services [facility]   Berton Mount 03/30/2023, 11:01 AM

## 2023-03-30 NOTE — Evaluation (Signed)
Occupational Therapy Evaluation/Discharge Patient Details Name: [name] [name] MRN: [id number] DOB: [birth year] Today's Date: 03/30/2023   History of Present Illness 62 y.o. 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 Same day s/p removal of deep implants, left tibia and fibula and partial excision of left tibia and fibula.   PMH: diabetes.   Clinical Impression   PTA, pt lives with spouse, ambulatory with RW and has light LB ADL assist from spouse. Pt presents now close to baseline, able to mobilize with RW and no more than min guard. Pt requires min guard to Min A for LB ADLs; discussed compensatory strategies for optimal independence w/ pt/wife receptive of this education. Pt functionally appropriate for DC home once medically cleared without need for OT follow up.       Recommendations for follow up therapy are one component of a multi-disciplinary discharge planning process, led by the attending physician.  Recommendations may be updated based on patient status, additional functional criteria and insurance authorization.   Assistance Recommended at Discharge PRN  Patient can return home with the following A little help with bathing/dressing/bathroom;Assistance with cooking/housework;Assist for transportation    Functional Status Assessment  Patient has had a recent decline in their functional status and demonstrates the ability to make significant improvements in function in a reasonable and predictable amount of time.  Equipment Recommendations  None recommended by OT    Recommendations for Other Services       Precautions / Restrictions Precautions Precautions: Fall Restrictions Weight Bearing Restrictions: Yes LLE Weight Bearing: Weight bearing as tolerated      Mobility Bed Mobility               General bed mobility comments: in chair on entry    Transfers Overall transfer level: Modified independent Equipment used: Rolling walker (2  wheels) Transfers: Sit to/from Stand Sit to Stand: Modified independent (Device/Increase time)                  Balance Overall balance assessment: Needs assistance Sitting-balance support: No upper extremity supported, Feet supported Sitting balance-Leahy Scale: Good     Standing balance support: Bilateral upper extremity supported, Reliant on assistive device for balance Standing balance-Leahy Scale: Fair Standing balance comment: able to stand without UE support for simulated LB dressing over waist                           ADL either performed or assessed with clinical judgement   ADL Overall ADL's : Needs assistance/impaired Eating/Feeding: Independent   Grooming: Modified independent;Standing   Upper Body Bathing: Set up;Sitting   Lower Body Bathing: Sit to/from stand;Supervison/ safety   Upper Body Dressing : Set up;Sitting   Lower Body Dressing: Minimal assistance;Sitting/lateral leans;Sit to/from stand   Toilet Transfer: Supervision/safety;Ambulation;Rolling walker (2 wheels)           Functional mobility during ADLs: Supervision/safety;Rolling walker (2 wheels) General ADL Comments: Pt with minor post op pain in LLE, noed foot drop difficulties. Educated re: compensatory strategies for LB ADLs, DME use, gentle dorsiflexion stretche     Vision Baseline Vision/History: 0 No visual deficits Ability to See in Adequate Light: 0 Adequate Patient Visual Report: No change from baseline Vision Assessment?: No apparent visual deficits     Perception     Praxis      Pertinent Vitals/Pain Pain Assessment Pain Assessment: Faces Faces Pain Scale: Hurts little more Pain Location: Lt ankle Pain  Descriptors / Indicators: Grimacing, Guarding Pain Intervention(s): Monitored during session, Limited activity within patient's tolerance     Hand Dominance Right   Extremity/Trunk Assessment Upper Extremity Assessment Upper Extremity Assessment:  Overall WFL for tasks assessed   Lower Extremity Assessment Lower Extremity Assessment: Defer to PT evaluation   Cervical / Trunk Assessment Cervical / Trunk Assessment: Normal   Communication Communication Communication: No difficulties   Cognition Arousal/Alertness: Awake/alert Behavior During Therapy: WFL for tasks assessed/performed Overall Cognitive Status: Within Functional Limits for tasks assessed                                       General Comments       Exercises Exercises: Other exercises Other Exercises Other Exercises: gentle dorsiflexion stretch with blanket around L foot   Shoulder Instructions      Home Living Family/patient expects to be discharged to:: Private residence Living Arrangements: Spouse/significant other;Children Available Help at Discharge: Family;Available PRN/intermittently (near 24/7) Type of Home: House Home Access: Level entry     Home Layout: One level     Bathroom Shower/Tub: Walk-in shower         Home Equipment: Agricultural consultant (2 wheels);Shower seat;Wheelchair - manual;Cane - single point          Prior Functioning/Environment Prior Level of Function : History of Falls (last six months);Needs assist             Mobility Comments: Reports multiple falls, using RW. Wife assisted with getting into bed ADLs Comments: Wife assisting with dressing LB but has used some compensatory strategies (used umbrella to thread pants leg through in the past). able to toilet self, bathing w/ some assist. Previously enjoyed yard work, mowing grass, etc        OT Problem List: Decreased activity tolerance;Impaired balance (sitting and/or standing);Pain      OT Treatment/Interventions:      OT Goals(Current goals can be found in the care plan section) Acute Rehab OT Goals Patient Stated Goal: no more surgeries, get back to mowing OT Goal Formulation: All assessment and education complete, DC therapy  OT Frequency:       Co-evaluation              AM-PAC OT "6 Clicks" Daily Activity     Outcome Measure Help from another person eating meals?: None Help from another person taking care of personal grooming?: None Help from another person toileting, which includes using toliet, bedpan, or urinal?: None Help from another person bathing (including washing, rinsing, drying)?: A Little Help from another person to put on and taking off regular upper body clothing?: None Help from another person to put on and taking off regular lower body clothing?: A Little 6 Click Score: 22   End of Session Equipment Utilized During Treatment: Rolling walker (2 wheels) Nurse Communication: Mobility status  Activity Tolerance: Patient tolerated treatment well Patient left: in chair;with call bell/phone within reach;with family/visitor present  OT Visit Diagnosis: Other abnormalities of gait and mobility (R26.89)                Time: 1610-9604 OT Time Calculation (min): 28 min Charges:  OT General Charges $OT Visit: 1 Visit OT Evaluation $OT Eval Low Complexity: 1 Low OT Treatments $Self Care/Home Management : 8-22 mins  Bradd Canary, OTR/L Acute Rehab Services Office: [phone]   Lorre Munroe 03/30/2023, 12:01 PM

## 2023-03-30 NOTE — Discharge Summary (Signed)
Orthopaedic Trauma Service (OTS) Discharge Summary   Patient ID: [name] [name] MRN: [id number] DOB/AGE: 02-12-1961 62 y.o.  Admit date: 03/29/2023 Discharge date: 03/30/2023  Admission Diagnoses: Osteomyelitis left tibia   Discharge Diagnoses:  Principal Problem:   Osteomyelitis of left tibia   Past Medical History:  Diagnosis Date   Complication of anesthesia    "hard for me to wake up" (09/08/2016)   Type II diabetes mellitus dx'd 07/2016     Procedures Performed: 03/29/2023- Dr. Carola Frost 1.  Removal of deep implants, left tibia. 2.  Removal of deep implants, left fibula. 3.  Partial excision of left tibia. 4.  Partial excision of left fibula   Discharged Condition: good  Hospital Course:   62 y/o admitted for draining wound L tibia. All hardware removed, ID consult. Admitted for pain control. PICC line placed on 03/30/2023 and home abx arranged. Pt discharged in stable condition on 03/30/2023  Consults: ID  Significant Diagnostic Studies: labs:       Component 2 wk ago  Specimen Description OTHER  Special Requests  SCREW  Gram Stain NO WBC SEEN FEW GRAM POSITIVE COCCI  Culture ABUNDANT ENTEROCOCCUS FAECALIS NO ANAEROBES ISOLATED Performed at [facility], [address] [place], [place] [address]  Report Status 04/03/2023 FINAL  Organism ID, Bacteria ENTEROCOCCUS FAECALIS  Resulting Agency [facility]     Susceptibility   Enterococcus faecalis    MIC    AMPICILLIN <=2 SENSITIVE Sensitive    GENTAMICIN SYNERGY SENSITIVE Sensitive    VANCOMYCIN 1 SENSITIVE Sensitive           Susceptibility Comments  Enterococcus faecalis  ABUNDANT ENTEROCOCCUS FAECALIS          Latest Reference Range & Units 03/29/23 06:00  Sodium 135 - 145 mmol/L 139  Potassium 3.5 - 5.1 mmol/L 3.7  Chloride 98 - 111 mmol/L 105  CO2 22 - 32 mmol/L 23  Glucose 70 - 99 mg/dL 191 (H)  Mean Plasma Glucose mg/dL 478.29  BUN 6 - 20 mg/dL 7  Creatinine 5.62 -  1.30 mg/dL 8.65 (L)  Calcium 8.9 - 10.3 mg/dL 9.0  Anion gap 5 - 15  11  Alkaline Phosphatase 38 - 126 U/L 45  Albumin 3.5 - 5.0 g/dL 3.7  AST 15 - 41 U/L 13 (L)  ALT 0 - 44 U/L 12  Total Protein 6.5 - 8.1 g/dL 6.6  Total Bilirubin 0.3 - 1.2 mg/dL 0.6  GFR, Estimated >78 mL/min >60  CRP <1.0 mg/dL <4.6  WBC 4.0 - 96.2 K/uL 8.9  RBC 4.22 - 5.81 MIL/uL 5.03  Hemoglobin 13.0 - 17.0 g/dL 95.2  HCT 84.1 - 32.4 % 41.3  MCV 80.0 - 100.0 fL 82.1  MCH 26.0 - 34.0 pg 30.8  MCHC 30.0 - 36.0 g/dL 40.1 (H)  RDW 02.7 - 25.3 % 14.2  Platelets 150 - 400 K/uL 270  nRBC 0.0 - 0.2 % 0.0  Neutrophils % 44  Lymphocytes % 42  Monocytes Relative % 10  Eosinophil % 3  Basophil % 1  Immature Granulocytes % 0  NEUT# 1.7 - 7.7 K/uL 3.9  Lymphocyte # 0.7 - 4.0 K/uL 3.7  Monocyte # 0.1 - 1.0 K/uL 0.9  Eosinophils Absolute 0.0 - 0.5 K/uL 0.3  Basophils Absolute 0.0 - 0.1 K/uL 0.1  Abs Immature Granulocytes 0.00 - 0.07 K/uL 0.02  Sed Rate 0 - 16 mm/hr 3  Hemoglobin A1C 4.8 - 5.6 % 5.3  (H): Data is abnormally high (L):  Data is abnormally low  Treatments: IV hydration, antibiotics: cefepime and daptomycin, anticoagulation: lovenox while inpatient and asa 325 mg daily at dc, therapies: PT, OT, and RN, and surgery: as above  Discharge Exam:   Orthopaedic Trauma Service Progress Note   Patient ID: [name] [name] MRN: [id number] DOB/AGE: May 02, 1961 62 y.o.   Subjective:   Doing quite well this morning Minimal pain Has been sitting in the chair for few hours No real complaints Very appreciative of his care   Intraoperative cultures showing some gram positive cocci preliminarily   Appreciate infectious disease assistance     Sed rate and CRP are within normal limits on admission 3 and <0.5 respectively   ROS As above Objective:    VITALS:         Vitals:    03/29/23 1349 03/29/23 2002 03/30/23 0502 03/30/23 0757  BP: 126/88 120/84 113/81 107/71  Pulse: 80 79 82 78  Resp: 17 18 18  18   Temp: (!) 97.5 F (36.4 C) 98 F (36.7 C) 97.7 F (36.5 C) 97.8 F (36.6 C)  TempSrc: Oral Oral Oral Oral  SpO2: 100% 100% 100% 100%  Weight:          Height:              Estimated body mass index is 28.41 kg/m as calculated from the following:   Height as of this encounter: 5\' 10"  (1.778 m).   Weight as of this encounter: 89.8 kg.     Intake/Output      04/18 0701 04/19 0700 04/19 0701 04/20 0700   P.O. 120    I.V. (mL/kg) 1572.5 (17.5)    IV Piggyback 347.2    Total Intake(mL/kg) 2039.7 (22.7)    Urine (mL/kg/hr) 1000 (0.5)    Stool 0    Blood 100    Total Output 1100    Net +939.7            LABS   Lab Results Last 24 Hours        Results for orders placed or performed during the hospital encounter of 03/29/23 (from the past 24 hour(s))  Aerobic/Anaerobic Culture w Gram Stain (surgical/deep wound)     Status: None (Preliminary result)    Collection Time: 03/29/23 10:11 AM    Specimen: Foot, Left; Body Fluid  Result Value Ref Range    Specimen Description OTHER      Special Requests  SCREW      Gram Stain NO WBC SEEN FEW GRAM POSITIVE COCCI        Culture          CULTURE REINCUBATED FOR BETTER GROWTH Performed at [facility], [address] [place], [place] [address]      Report Status PENDING    Glucose, capillary     Status: Abnormal    Collection Time: 03/29/23 10:59 AM  Result Value Ref Range    Glucose-Capillary 117 (H) 70 - 99 mg/dL  Glucose, capillary     Status: Abnormal    Collection Time: 03/29/23  4:45 PM  Result Value Ref Range    Glucose-Capillary 227 (H) 70 - 99 mg/dL  Glucose, capillary     Status: Abnormal    Collection Time: 03/29/23  8:01 PM  Result Value Ref Range    Glucose-Capillary 239 (H) 70 - 99 mg/dL  CK     Status: None    Collection Time: 03/30/23  5:49 AM  Result Value Ref Range  Total CK 80 49 - 397 U/L  Glucose, capillary     Status: Abnormal    Collection Time: 03/30/23  7:55 AM  Result Value Ref  Range    Glucose-Capillary 102 (H) 70 - 99 mg/dL          PHYSICAL EXAM:    Gen: sitting up in chair, pleasant, NAD, looks good  Lungs: Unlabored Cardiac: Regular Ext:       Left lower extremity             Dressing is clean, dry and intact             Chronic edema appreciated             Distal motor and sensory functions are at baseline and intact             Extremity is warm             No acute findings noted     Assessment/Plan: 1 Day Post-Op    Principal Problem:   Osteomyelitis of left tibia     Anti-infectives (From admission, onward)        Start     Dose/Rate Route Frequency Ordered Stop    03/29/23 1600   DAPTOmycin (CUBICIN) 700 mg in sodium chloride 0.9 % IVPB        8 mg/kg  89.8 kg 128 mL/hr over 30 Minutes Intravenous Daily 03/29/23 1359      03/29/23 1445   ceFEPIme (MAXIPIME) 2 g in sodium chloride 0.9 % 100 mL IVPB        2 g 200 mL/hr over 30 Minutes Intravenous Every 8 hours 03/29/23 1359           .   POD/HD#: 26   62 year old male s/p ORIF complex left pilon fracture 2017 with chronic osteomyelitis left tibia and fibula s/p removal of hardware left tibia and fibula   -Removal of hardware left distal tibia and fibula for osteomyelitis s/p remote ORIF left pilon fracture Weightbearing Weight-bear as tolerated left leg.  May need to use crutches or walker               ROM/Activity                         Unrestricted range of motion of left ankle and knee               Wound care                         Daily wound care starting on 04/01/2023.  Dressing changes as needed.               Therapy evaluations             Ice and elevate   - Pain management:             Multimodal    - ABL anemia/Hemodynamics             Stable - Medical issues              Chronic medical issues are stable including diabetes which is in much better control.  A1c on this admission is 5.3.  October 2023 it was 8.6   - DVT/PE prophylaxis:              Currently on Lovenox.  Discharged on aspirin 325 mg daily - ID:  Antibiotic selection per ID service                         Currently on broad-spectrum coverage with cefepime and daptomycin                         Will narrow down as cultures are finalized               PICC line to be placed today   - Activity:             As above - FEN/GI prophylaxis/Foley/Lines:             Carb modified diet   - Dispo:             Discharge home today if PICC line and home antibiotics can be arranged otherwise tomorrow  Disposition: Discharge disposition: 01-Home or Self Care       Discharge Instructions     [facility] pharmacist to adjust dose for Vancomycin, Aminoglycosides and other anti-infective therapies as requested by physician.   Complete by: As directed    [facility] to provide Cath Flo 2mg    Complete by: As directed    Administer for PICC line occlusion and as ordered by physician for other access device issues.   Anaphylaxis Kit: Provided to treat any anaphylactic reaction to the medication being provided to the patient if First Dose or when requested by physician   Complete by: As directed    Epinephrine 1mg /ml vial / amp: Administer 0.3mg  (0.49ml) subcutaneously once for moderate to severe anaphylaxis, nurse to call physician and pharmacy when reaction occurs and call 911 if needed for immediate care   Diphenhydramine 50mg /ml IV vial: Administer 25-50mg  IV/IM PRN for first dose reaction, rash, itching, mild reaction, nurse to call physician and pharmacy when reaction occurs   Sodium Chloride 0.9% NS IV: Administer if needed for hypovolemic blood pressure drop or as ordered by physician after call to physician with anaphylactic reaction   Call MD / Call 911   Complete by: As directed    If you experience chest pain or shortness of breath, CALL 911 and be transported to the hospital emergency room.  If you develope a fever above 101 F,  pus (white drainage) or increased drainage or redness at the wound, or calf pain, call your surgeon's office.   Change dressing on IV access line weekly and PRN   Complete by: As directed    Constipation Prevention   Complete by: As directed    Drink plenty of fluids.  Prune juice may be helpful.  You may use a stool softener, such as Colace (over the counter) 100 mg twice a day.  Use MiraLax (over the counter) for constipation as needed.   Diet general   Complete by: As directed    Discharge instructions   Complete by: As directed    Orthopaedic Trauma Service Discharge Instructions   General Discharge Instructions  Orthopaedic Injuries:  Osteomyelitis left tibia and fibula treated with removal of hardware and debridement of bone.  You are also on IV antibiotics to treat the infection  WEIGHT BEARING STATUS: Weight-bear as tolerated left leg use walker or crutches as needed  RANGE OF MOTION/ACTIVITY: Unrestricted range of motion left ankle.  Activity as tolerated.  Slowly increase activity level   Wound Care: Daily wound care as needed starting on 04/01/2023.  Please see below  Discharge Wound Care Instructions  Do NOT apply any ointments, solutions or lotions to pin sites or surgical wounds.  These prevent needed drainage and even though solutions like hydrogen peroxide kill bacteria, they also damage cells lining the pin sites that help fight infection.  Applying lotions or ointments can keep the wounds moist and can cause them to breakdown and open up as well. This can increase the risk for infection. When in doubt call the office.  Surgical incisions should be dressed daily.  If any drainage is noted, use one layer of adaptic or Mepitel, then gauze, Kerlix, and an ace  wrap.  [email or web address] [email or web address]  [email or web address]  These dressing supplies should be available at local medical supply stores ([facility] [employer], [facility], etc). They are not usually carried at places like [facility], [facility], [employer], etc  Once the incision is completely dry and without drainage, it may be left open to air out.  Showering may begin 36-48 hours later.  Cleaning gently with soap and water.  Traumatic wounds should be dressed daily as well.    One layer of adaptic, gauze, Kerlix, then ace wrap.  The adaptic can be discontinued once the draining has ceased    If you have a wet to dry dressing: wet the gauze with saline the squeeze as much saline out so the gauze is moist (not soaking wet), place moistened gauze over wound, then place a dry gauze over the moist one, followed by Kerlix wrap, then ace wrap.   DVT/PE prophylaxis: Aspirin 325 mg daily for 30 days  Diet: as you were eating previously.  Can use over the counter stool softeners and bowel preparations, such as Miralax, to help with bowel movements.  Narcotics can be constipating.  Be sure to drink plenty of fluids  PAIN MEDICATION USE AND EXPECTATIONS  You have likely been given narcotic medications to help control your pain.  After a traumatic event that results in an fracture (broken bone) with or without surgery, it is ok to use narcotic pain medications to help control one's pain.  We understand that everyone responds to pain differently and each individual patient will be evaluated on a regular basis for the continued need for narcotic medications. Ideally, narcotic medication use should last no more than 6-8 weeks (coinciding with fracture healing).   As a patient it is your  responsibility as well to monitor narcotic medication use and report the amount and frequency you use these medications when you come to your office visit.   We would also advise that if you are using narcotic medications, you should take a dose prior to therapy to maximize you participation.  IF YOU ARE ON NARCOTIC MEDICATIONS IT IS NOT PERMISSIBLE TO OPERATE A MOTOR VEHICLE (MOTORCYCLE/CAR/TRUCK/MOPED) OR HEAVY MACHINERY DO NOT MIX NARCOTICS WITH OTHER CNS (CENTRAL NERVOUS SYSTEM) DEPRESSANTS SUCH AS ALCOHOL   POST-OPERATIVE OPIOID TAPER INSTRUCTIONS:  It is important to wean off of your opioid medication as soon as possible. If you do not need pain medication after your surgery it is ok to stop day one.  Opioids include:  o Codeine, Hydrocodone(Norco, Vicodin), Oxycodone(Percocet, oxycontin) and hydromorphone amongst others.   Long term and even short term use of opiods can cause:  o Increased pain response  o Dependence  o Constipation  o Depression  o Respiratory depression  o And more.   Withdrawal symptoms can include  o Flu like symptoms  o Nausea, vomiting  o And more  Techniques to manage these  symptoms  o Hydrate well  o Eat regular healthy meals  o Stay active  o Use relaxation techniques(deep breathing, meditating, yoga)  Do Not substitute Alcohol to help with tapering  If you have been on opioids for less than two weeks and do not have pain than it is ok to stop all together.   Plan to wean off of opioids  o This plan should start within one week post op of your fracture surgery   o Maintain the same interval or time between taking each dose and first decrease the dose.   o Cut the total daily intake of opioids by one tablet each day  o Next start to increase the time between doses.  o The last dose that should be eliminated is the evening dose.    STOP SMOKING OR USING NICOTINE PRODUCTS!!!!  As discussed nicotine severely impairs your body's ability to  heal surgical and traumatic wounds but also impairs bone healing.  Wounds and bone heal by forming microscopic blood vessels (angiogenesis) and nicotine is a vasoconstrictor (essentially, shrinks blood vessels).  Therefore, if vasoconstriction occurs to these microscopic blood vessels they essentially disappear and are unable to deliver necessary nutrients to the healing tissue.  This is one modifiable factor that you can do to dramatically increase your chances of healing your injury.    (This means no smoking, no nicotine gum, patches, etc)  DO NOT USE NONSTEROIDAL ANTI-INFLAMMATORY DRUGS (NSAID'S)  Using products such as Advil (ibuprofen), Aleve (naproxen), Motrin (ibuprofen) for additional pain control during fracture healing can delay and/or prevent the healing response.  If you would like to take over the counter (OTC) medication, Tylenol (acetaminophen) is ok.  However, some narcotic medications that are given for pain control contain acetaminophen as well. Therefore, you should not exceed more than 4000 mg of tylenol in a day if you do not have liver disease.  Also note that there are may OTC medicines, such as cold medicines and allergy medicines that my contain tylenol as well.  If you have any questions about medications and/or interactions please ask your doctor/PA or your pharmacist.      ICE AND ELEVATE INJURED/OPERATIVE EXTREMITY  Using ice and elevating the injured extremity above your heart can help with swelling and pain control.  Icing in a pulsatile fashion, such as 20 minutes on and 20 minutes off, can be followed.    Do not place ice directly on skin. Make sure there is a barrier between to skin and the ice pack.    Using frozen items such as frozen peas works well as the conform nicely to the are that needs to be iced.  USE AN ACE WRAP OR TED HOSE FOR SWELLING CONTROL  In addition to icing and elevation, Ace wraps or TED hose are used to help limit and resolve swelling.  It is  recommended to use Ace wraps or TED hose until you are informed to stop.    When using Ace Wraps start the wrapping distally (farthest away from the body) and wrap proximally (closer to the body)   Example: If you had surgery on your leg or thing and you do not have a splint on, start the ace wrap at the toes and work your way up to the thigh        If you had surgery on your upper extremity and do not have a splint on, start the ace wrap at your fingers and work your way up to  the upper arm  IF YOU ARE IN A SPLINT OR CAST DO NOT REMOVE IT FOR ANY REASON   If your splint gets wet for any reason please contact the office immediately. You may shower in your splint or cast as long as you keep it dry.  This can be done by wrapping in a cast cover or garbage back (or similar)  Do Not stick any thing down your splint or cast such as pencils, money, or hangers to try and scratch yourself with.  If you feel itchy take benadryl as prescribed on the bottle for itching  IF YOU ARE IN A CAM BOOT (BLACK BOOT)  You may remove boot periodically. Perform daily dressing changes as noted below.  Wash the liner of the boot regularly and wear a sock when wearing the boot. It is recommended that you sleep in the boot until told otherwise    Call office for the following: ? Temperature greater than 101F ? Persistent nausea and vomiting ? Severe uncontrolled pain ? Redness, tenderness, or signs of infection (pain, swelling, redness, odor or green/yellow discharge around the site) ? Difficulty breathing, headache or visual disturbances ? Hives ? Persistent dizziness or light-headedness ? Extreme fatigue ? Any other questions or concerns you may have after discharge  In an emergency, call 911 or go to an Emergency Department at a nearby hospital  HELPFUL INFORMATION  ? If you had a block, it will wear off between 8-24 hrs postop typically.  This is period when your pain may go from nearly zero to the pain you  would have had postop without the block.  This is an abrupt transition but nothing dangerous is happening.  You may take an extra dose of narcotic when this happens.  ? You should wean off your narcotic medicines as soon as you are able.  Most patients will be off or using minimal narcotics before their first postop appointment.   ? We suggest you use the pain medication the first night prior to going to bed, in order to ease any pain when the anesthesia wears off. You should avoid taking pain medications on an empty stomach as it will make you nauseous.  ? Do not drink alcoholic beverages or take illicit drugs when taking pain medications.  ? In most states it is against the law to drive while you are in a splint or sling.  And certainly against the law to drive while taking narcotics.  ? You may return to work/school in the next couple of days when you feel up to it.   ? Pain medication may make you constipated.  Below are a few solutions to try in this order:   ? Decrease the amount of pain medication if you aren't having pain.   ? Drink lots of decaffeinated fluids.   ? Drink prune juice and/or each dried prunes   o If the first 3 don't work start with additional solutions   ? Take Colace - an over-the-counter stool softener   ? Take Senokot - an over-the-counter laxative   ? Take Miralax - a stronger over-the-counter laxative     CALL THE OFFICE WITH ANY QUESTIONS OR CONCERNS: [phone]   VISIT OUR WEBSITE FOR ADDITIONAL INFORMATION: [email or web address]   Flush IV access with Sodium Chloride 0.9% and Heparin 10 units/ml or 100 units/ml   Complete by: As directed    Home infusion instructions - [facility]   Complete by: As directed    Instructions:  Flush IV access with Sodium Chloride 0.9% and Heparin 10units/ml or 100units/ml   Change dressing on IV access line: Weekly and PRN   Instructions Cath Flo 2mg : Administer for PICC Line occlusion and as ordered by  physician for other access device   [facility] pharmacist to adjust dose for: Vancomycin, Aminoglycosides and other anti-infective therapies as requested by physician   Increase activity slowly as tolerated   Complete by: As directed    Method of administration may be changed at the discretion of home infusion pharmacist based upon assessment of the patient and/or caregiver's ability to self-administer the medication ordered   Complete by: As directed    Post-operative opioid taper instructions:   Complete by: As directed    POST-OPERATIVE OPIOID TAPER INSTRUCTIONS: It is important to wean off of your opioid medication as soon as possible. If you do not need pain medication after your surgery it is ok to stop day one. Opioids include: Codeine, Hydrocodone(Norco, Vicodin), Oxycodone(Percocet, oxycontin) and hydromorphone amongst others.  Long term and even short term use of opiods can cause: Increased pain response Dependence Constipation Depression Respiratory depression And more.  Withdrawal symptoms can include Flu like symptoms Nausea, vomiting And more Techniques to manage these symptoms Hydrate well Eat regular healthy meals Stay active Use relaxation techniques(deep breathing, meditating, yoga) Do Not substitute Alcohol to help with tapering If you have been on opioids for less than two weeks and do not have pain than it is ok to stop all together.  Plan to wean off of opioids This plan should start within one week post op of your joint replacement. Maintain the same interval or time between taking each dose and first decrease the dose.  Cut the total daily intake of opioids by one tablet each day Next start to increase the time between doses. The last dose that should be eliminated is the evening dose.      Weight bearing as tolerated   Complete by: As directed       Allergies as of 03/30/2023   No Known Allergies      Medication List     STOP  taking these medications    acetaminophen 650 MG CR tablet Commonly known as: TYLENOL Replaced by: acetaminophen 325 MG tablet   methocarbamol 500 MG tablet Commonly known as: ROBAXIN       TAKE these medications    acetaminophen 325 MG tablet Commonly known as: TYLENOL Take 1-2 tablets (325-650 mg total) by mouth every 6 (six) hours as needed for mild pain (pain score 1-3 or temp > 100.5). Replaces: acetaminophen 650 MG CR tablet   aspirin EC 325 MG tablet Take 1 tablet (325 mg total) by mouth daily.   blood glucose meter kit and supplies Kit Dispense based on patient and insurance preference. Use up to four times daily as directed. (FOR ICD-9 250.00, 250.01).   ceFEPime  IVPB Commonly known as: MAXIPIME Inject 2 g into the vein every 8 (eight) hours. Indication:  LLE osteo First Dose: Yes Last Day of Therapy:  05/10/23 Labs - Once weekly:  CBC/D and BMP, Labs - Every other week:  ESR and CRP Method of administration: IV Push Pull PICC line at the completion of IV antibiotics Method of administration may be changed at the discretion of home infusion pharmacist based upon assessment of the patient and/or caregiver's ability to self-administer the medication ordered.   daptomycin  IVPB Commonly known as: CUBICIN Inject 700 mg into the vein  daily. Indication:  LLE ostoe First Dose: Yes Last Day of Therapy:  05/10/23 Labs - Once weekly:  CBC/D, BMP, and CPK Labs - Every other week:  ESR and CRP Method of administration: IV Push Pull PICC line at the completion of IV antibiotics Method of administration may be changed at the discretion of home infusion pharmacist based upon assessment of the patient and/or caregiver's ability to self-administer the medication ordered.   docusate sodium 100 MG capsule Commonly known as: COLACE Take 1 capsule (100 mg total) by mouth 2 (two) times daily.   gabapentin 300 MG capsule Commonly known as: NEURONTIN Take 1 capsule (300 mg total)  by mouth 3 (three) times daily.   HYDROcodone-acetaminophen 7.5-325 MG tablet Commonly known as: NORCO Take 1-2 tablets by mouth every 8 (eight) hours as needed for severe pain.   metFORMIN 500 MG tablet Commonly known as: GLUCOPHAGE Take 500 mg by mouth 2 (two) times daily.   vitamin C 250 MG tablet Commonly known as: ASCORBIC ACID Take 250 mg by mouth daily.   Vitamin D3 50 MCG (2000 UT) capsule Take 4,000 Units by mouth daily.               Discharge Care Instructions  (From admission, onward)           Start     Ordered   03/30/23 0000  Change dressing on IV access line weekly and PRN  (Home infusion instructions - [facility] )        03/30/23 1359   03/30/23 0000  Weight bearing as tolerated        03/30/23 1416            Follow-up Information     Myrene Galas, MD. Schedule an appointment as soon as possible for a visit in 2 week(s).   Specialty: Orthopedic Surgery Contact information: [address] [place] [address] [phone]         Odette Fraction, MD. Schedule an appointment as soon as possible for a visit in 4 week(s).   Specialty: Infectious Diseases Contact information: [address] [place] [address] [phone]                  Signed:  Mearl Latin, Cordelia Poche [phone] Salena Saner) 03/30/2023, 2:18 PM  [facility] [address] [name] [address] [phone] (F)

## 2023-03-31 LAB — AEROBIC/ANAEROBIC CULTURE W GRAM STAIN (SURGICAL/DEEP WOUND): Gram Stain: NONE SEEN

## 2023-04-01 LAB — AEROBIC/ANAEROBIC CULTURE W GRAM STAIN (SURGICAL/DEEP WOUND): Gram Stain: NONE SEEN

## 2023-04-02 LAB — AEROBIC/ANAEROBIC CULTURE W GRAM STAIN (SURGICAL/DEEP WOUND)

## 2023-04-03 ENCOUNTER — Telehealth: Payer: Self-pay | Admitting: Pharmacist

## 2023-04-03 ENCOUNTER — Inpatient Hospital Stay: Payer: Commercial Managed Care - PPO | Admitting: Infectious Diseases

## 2023-04-03 DIAGNOSIS — M869 Osteomyelitis, unspecified: Secondary | ICD-10-CM

## 2023-04-03 LAB — AEROBIC/ANAEROBIC CULTURE W GRAM STAIN (SURGICAL/DEEP WOUND)
Gram Stain: NONE SEEN
Gram Stain: NONE SEEN

## 2023-04-03 MED ORDER — METRONIDAZOLE 500 MG PO TABS
500.0000 mg | ORAL_TABLET | Freq: Two times a day (BID) | ORAL | 0 refills | Status: DC
Start: 2023-04-03 — End: 2023-06-28

## 2023-04-03 NOTE — Telephone Encounter (Signed)
Per Dr. Elinor Parkinson, called and spoke to [facility] at [facility] to give orders to stop patient's IV daptomycin and cefepime and switch to IV ampicillin continuous infusion 12 gm/day. Cassy verbalized understanding and will start patient this week.  Called patient to discuss. He is good with the plan. Explained that I will be sending an oral prescription for metronidazole 500 mg PO BID x 2 weeks as well. Advised him not to drink alcohol while taking and to finish all of the antibiotics. He verbalized understanding.  Bernardina Cacho L. Meliah Appleman, PharmD, BCIDP, AAHIVP, CPP Clinical Pharmacist Practitioner Infectious Diseases Clinical Pharmacist [facility] 04/03/2023, 10:26 AM

## 2023-04-05 ENCOUNTER — Ambulatory Visit: Payer: Commercial Managed Care - PPO | Attending: Orthopedic Surgery | Admitting: Physical Therapy

## 2023-04-05 ENCOUNTER — Encounter: Payer: Self-pay | Admitting: Physical Therapy

## 2023-04-05 ENCOUNTER — Other Ambulatory Visit: Payer: Self-pay

## 2023-04-05 DIAGNOSIS — R2681 Unsteadiness on feet: Secondary | ICD-10-CM | POA: Diagnosis present

## 2023-04-05 DIAGNOSIS — M25572 Pain in left ankle and joints of left foot: Secondary | ICD-10-CM | POA: Diagnosis not present

## 2023-04-05 DIAGNOSIS — R2689 Other abnormalities of gait and mobility: Secondary | ICD-10-CM

## 2023-04-05 DIAGNOSIS — M6281 Muscle weakness (generalized): Secondary | ICD-10-CM | POA: Diagnosis present
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
## 2023-04-18 ENCOUNTER — Ambulatory Visit: Payer: Commercial Managed Care - PPO | Admitting: Physical Therapy

## 2023-04-23 ENCOUNTER — Encounter: Payer: Self-pay | Admitting: Physical Therapy

## 2023-04-23 ENCOUNTER — Ambulatory Visit: Payer: Commercial Managed Care - PPO | Attending: Orthopedic Surgery | Admitting: Physical Therapy

## 2023-04-23 DIAGNOSIS — M6281 Muscle weakness (generalized): Secondary | ICD-10-CM

## 2023-04-23 DIAGNOSIS — R2681 Unsteadiness on feet: Secondary | ICD-10-CM | POA: Insufficient documentation

## 2023-04-23 DIAGNOSIS — M25572 Pain in left ankle and joints of left foot: Secondary | ICD-10-CM | POA: Insufficient documentation

## 2023-04-23 DIAGNOSIS — R2689 Other abnormalities of gait and mobility: Secondary | ICD-10-CM
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
## 2023-04-30 ENCOUNTER — Ambulatory Visit: Payer: Commercial Managed Care - PPO | Admitting: Physical Therapy

## 2023-04-30 ENCOUNTER — Encounter: Payer: Self-pay | Admitting: Physical Therapy

## 2023-04-30 DIAGNOSIS — R2689 Other abnormalities of gait and mobility: Secondary | ICD-10-CM

## 2023-04-30 DIAGNOSIS — R2681 Unsteadiness on feet: Secondary | ICD-10-CM

## 2023-04-30 DIAGNOSIS — M6281 Muscle weakness (generalized): Secondary | ICD-10-CM

## 2023-04-30 DIAGNOSIS — M25572 Pain in left ankle and joints of left foot: Secondary | ICD-10-CM

## 2023-05-03 ENCOUNTER — Other Ambulatory Visit: Payer: Self-pay

## 2023-05-03 ENCOUNTER — Telehealth: Payer: Self-pay

## 2023-05-03 ENCOUNTER — Encounter: Payer: Self-pay | Admitting: Internal Medicine

## 2023-05-03 ENCOUNTER — Ambulatory Visit (INDEPENDENT_AMBULATORY_CARE_PROVIDER_SITE_OTHER): Payer: Commercial Managed Care - PPO | Admitting: Internal Medicine

## 2023-05-03 VITALS — BP 116/80 | HR 97 | Temp 98.3°F | Resp 16

## 2023-05-03 DIAGNOSIS — M869 Osteomyelitis, unspecified: Secondary | ICD-10-CM | POA: Diagnosis not present

## 2023-05-03 MED ORDER — AMOXICILLIN 500 MG PO TABS: 1000.0000 mg | ORAL_TABLET | Freq: Three times a day (TID) | ORAL | 5 refills | Status: AC

## 2023-05-03 NOTE — Telephone Encounter (Signed)
Per Dr.Vu - Pull PICC line after last dose of IV ampicillin on 05/10/2023. Message sent to Jeri Modena, RN/ [facility] staff and [facility] pharmacists.     Saira Kramme Lesli Albee, CMA

## 2023-05-03 NOTE — Patient Instructions (Addendum)
Continue ampicillin until 05/10/23 Stop metronidazole  On 5/31 start amoxicillin 1 gram (2 tablet) 3 times a day. Pick this up today   I'll see you in 4-6 weeks

## 2023-05-03 NOTE — Telephone Encounter (Signed)
Thank you :)
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
## 2023-05-09 ENCOUNTER — Ambulatory Visit: Payer: Commercial Managed Care - PPO | Admitting: Physical Therapy

## 2023-05-09 ENCOUNTER — Encounter: Payer: Self-pay | Admitting: Physical Therapy

## 2023-05-09 DIAGNOSIS — M25572 Pain in left ankle and joints of left foot: Secondary | ICD-10-CM

## 2023-05-09 DIAGNOSIS — M6281 Muscle weakness (generalized): Secondary | ICD-10-CM

## 2023-05-09 DIAGNOSIS — R2681 Unsteadiness on feet: Secondary | ICD-10-CM

## 2023-05-14 ENCOUNTER — Ambulatory Visit: Payer: Commercial Managed Care - PPO | Attending: Orthopedic Surgery

## 2023-05-14 DIAGNOSIS — M25572 Pain in left ankle and joints of left foot: Secondary | ICD-10-CM | POA: Diagnosis present

## 2023-05-14 DIAGNOSIS — M6281 Muscle weakness (generalized): Secondary | ICD-10-CM | POA: Diagnosis present

## 2023-05-14 DIAGNOSIS — R2681 Unsteadiness on feet: Secondary | ICD-10-CM | POA: Insufficient documentation

## 2023-05-14 DIAGNOSIS — R2689 Other abnormalities of gait and mobility: Secondary | ICD-10-CM | POA: Insufficient documentation
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
## 2023-05-16 ENCOUNTER — Ambulatory Visit: Payer: Commercial Managed Care - PPO | Admitting: Physical Therapy

## 2023-05-25 ENCOUNTER — Ambulatory Visit: Payer: Commercial Managed Care - PPO

## 2023-05-25 DIAGNOSIS — R2681 Unsteadiness on feet: Secondary | ICD-10-CM

## 2023-05-25 DIAGNOSIS — M25572 Pain in left ankle and joints of left foot: Secondary | ICD-10-CM | POA: Diagnosis not present

## 2023-05-25 DIAGNOSIS — M6281 Muscle weakness (generalized): Secondary | ICD-10-CM

## 2023-05-25 DIAGNOSIS — R2689 Other abnormalities of gait and mobility: Secondary | ICD-10-CM
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
## 2023-05-28 ENCOUNTER — Ambulatory Visit: Payer: Commercial Managed Care - PPO | Admitting: Physical Therapy

## 2023-05-28 ENCOUNTER — Encounter: Payer: Self-pay | Admitting: Physical Therapy

## 2023-05-28 DIAGNOSIS — M6281 Muscle weakness (generalized): Secondary | ICD-10-CM

## 2023-05-28 DIAGNOSIS — M25572 Pain in left ankle and joints of left foot: Secondary | ICD-10-CM

## 2023-05-28 DIAGNOSIS — R2689 Other abnormalities of gait and mobility: Secondary | ICD-10-CM

## 2023-05-28 DIAGNOSIS — R2681 Unsteadiness on feet: Secondary | ICD-10-CM
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
## 2023-06-18 ENCOUNTER — Ambulatory Visit: Payer: Commercial Managed Care - PPO | Attending: Orthopedic Surgery

## 2023-06-18 DIAGNOSIS — M6281 Muscle weakness (generalized): Secondary | ICD-10-CM | POA: Insufficient documentation

## 2023-06-18 DIAGNOSIS — R2689 Other abnormalities of gait and mobility: Secondary | ICD-10-CM | POA: Insufficient documentation

## 2023-06-18 DIAGNOSIS — M25572 Pain in left ankle and joints of left foot: Secondary | ICD-10-CM | POA: Insufficient documentation

## 2023-06-18 DIAGNOSIS — R2681 Unsteadiness on feet: Secondary | ICD-10-CM | POA: Insufficient documentation
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
## 2023-06-28 ENCOUNTER — Other Ambulatory Visit: Payer: Self-pay

## 2023-06-28 ENCOUNTER — Ambulatory Visit (INDEPENDENT_AMBULATORY_CARE_PROVIDER_SITE_OTHER): Payer: Self-pay | Admitting: Internal Medicine

## 2023-06-28 ENCOUNTER — Encounter: Payer: Self-pay | Admitting: Internal Medicine

## 2023-06-28 VITALS — BP 117/81 | HR 110 | Resp 16 | Ht 70.0 in | Wt 231.0 lb

## 2023-06-28 DIAGNOSIS — M869 Osteomyelitis, unspecified: Secondary | ICD-10-CM

## 2023-06-28 DIAGNOSIS — E119 Type 2 diabetes mellitus without complications: Secondary | ICD-10-CM

## 2023-06-28 NOTE — Patient Instructions (Signed)
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
## 2023-06-29 LAB — COMPLETE METABOLIC PANEL WITH GFR
AG Ratio: 1.6 (calc) (ref 1.0–2.5)
ALT: 8 U/L — ABNORMAL LOW (ref 9–46)
AST: 11 U/L (ref 10–35)
Albumin: 4.4 g/dL (ref 3.6–5.1)
Alkaline phosphatase (APISO): 55 U/L (ref 35–144)
BUN/Creatinine Ratio: 10 (calc) (ref 6–22)
BUN: 6 mg/dL — ABNORMAL LOW (ref 7–25)
CO2: 25 mmol/L (ref 20–32)
Calcium: 9.8 mg/dL (ref 8.6–10.3)
Chloride: 106 mmol/L (ref 98–110)
Creat: 0.63 mg/dL — ABNORMAL LOW (ref 0.70–1.30)
Globulin: 2.8 g/dL (calc) (ref 1.9–3.7)
Glucose, Bld: 126 mg/dL — ABNORMAL HIGH (ref 65–99)
Potassium: 4.5 mmol/L (ref 3.5–5.3)
Sodium: 139 mmol/L (ref 135–146)
Total Bilirubin: 0.6 mg/dL (ref 0.2–1.2)
Total Protein: 7.2 g/dL (ref 6.1–8.1)
eGFR: 110 mL/min/{1.73_m2} (ref >=60)

## 2023-06-29 LAB — CBC
HCT: 45.9 % (ref 38.5–50.0)
Hemoglobin: 15.6 g/dL (ref 13.2–17.1)
MCH: 29.7 pg (ref 27.0–33.0)
MCHC: 34 g/dL (ref 32.0–36.0)
MCV: 87.4 fL (ref 80.0–100.0)
MPV: 12.5 fL (ref 7.5–12.5)
Platelets: 308 10*3/uL (ref 140–400)
RBC: 5.25 10*6/uL (ref 4.20–5.80)
RDW: 13.7 % (ref 11.0–15.0)
WBC: 7.3 10*3/uL (ref 3.8–10.8)

## 2023-06-29 LAB — C-REACTIVE PROTEIN: CRP: <3 mg/L (ref <8.0)

## 2023-07-02 ENCOUNTER — Ambulatory Visit: Payer: Commercial Managed Care - PPO

## 2023-07-02 DIAGNOSIS — M25572 Pain in left ankle and joints of left foot: Secondary | ICD-10-CM

## 2023-07-02 DIAGNOSIS — M6281 Muscle weakness (generalized): Secondary | ICD-10-CM

## 2023-07-02 DIAGNOSIS — R2689 Other abnormalities of gait and mobility: Secondary | ICD-10-CM

## 2023-07-02 DIAGNOSIS — R2681 Unsteadiness on feet: Secondary | ICD-10-CM
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
## 2023-07-12 ENCOUNTER — Encounter: Payer: Self-pay | Admitting: Podiatry

## 2023-07-12 ENCOUNTER — Ambulatory Visit (INDEPENDENT_AMBULATORY_CARE_PROVIDER_SITE_OTHER): Payer: PRIVATE HEALTH INSURANCE | Admitting: Podiatry

## 2023-07-12 DIAGNOSIS — E1140 Type 2 diabetes mellitus with diabetic neuropathy, unspecified: Secondary | ICD-10-CM | POA: Diagnosis not present

## 2023-07-12 DIAGNOSIS — M79675 Pain in left toe(s): Secondary | ICD-10-CM

## 2023-07-12 DIAGNOSIS — E1149 Type 2 diabetes mellitus with other diabetic neurological complication: Secondary | ICD-10-CM

## 2023-07-12 DIAGNOSIS — B351 Tinea unguium: Secondary | ICD-10-CM | POA: Diagnosis not present

## 2023-07-12 DIAGNOSIS — M79674 Pain in right toe(s): Secondary | ICD-10-CM

## 2023-07-14 NOTE — Progress Notes (Signed)
Subjective:   Patient ID: [name] [name], male   DOB: 62 y.o.   MRN: [id number]   HPI Patient presents who has had a history of diabetes and had quite a bit of issues with them with elongated nailbeds 1-5 both feet that are very thick and dystrophic.  There is discoloration of his toes he is concerned that he is got a lot of dry skin but there is not been a significant change in this recently.  Patient no longer smokes and is lost a lot of weight but did have diabetes not in good control for extensive period of time   Review of Systems  All other systems reviewed and are negative.       Objective:  Physical Exam Vitals and nursing note reviewed.  Constitutional:      Appearance: He is well-developed.  Pulmonary:     Effort: Pulmonary effort is normal.  Musculoskeletal:        General: Normal range of motion.  Skin:    General: Skin is warm.  Neurological:     Mental Status: He is alert.     Vascular status shows pulses that are intact but he does have some edema in his ankles lower legs probably due to the long-term obesity diabetic issues.  He has some discoloration of his toes but no breakdown in skin was currently noted or indications of current ulcerations.  He has severely elongated nailbeds 1-5 both feet that are very painful     Assessment:  Diabetic has had a lot of issues and is hopefully under better control now with a lot of dry skin discoloration no indications of ulceration and severe nail disease with pain     Plan:  H&P reviewed daily inspections of his feet.  If any breakdowns of tissue were to occur or any increased pain or further discoloration he is to let us know but I am hopeful at this point this is just his normal like skin structure.  I did debride nailbeds 1-5 both feet no angiogenic bleeding and this can be done as needed and again encouraged daily inspections continued good control of his diabetes and continued weight modification

## 2023-07-16 ENCOUNTER — Ambulatory Visit: Payer: PRIVATE HEALTH INSURANCE | Attending: Orthopedic Surgery

## 2023-07-17 ENCOUNTER — Telehealth: Payer: Self-pay

## 2023-07-17 NOTE — Telephone Encounter (Signed)
LVM asking patient to call back regarding insurance coverage for outpatient PT services.   Letitia Libra, PT, DPT, ATC 07/17/23 9:48 AM

## 2023-07-18 ENCOUNTER — Encounter: Payer: Self-pay | Admitting: Adult Health

## 2023-07-18 ENCOUNTER — Ambulatory Visit: Payer: PRIVATE HEALTH INSURANCE | Admitting: Adult Health

## 2023-07-18 VITALS — BP 124/88 | HR 102 | Ht 70.0 in | Wt 236.0 lb

## 2023-07-18 DIAGNOSIS — Z7985 Long-term (current) use of injectable non-insulin antidiabetic drugs: Secondary | ICD-10-CM | POA: Diagnosis not present

## 2023-07-18 DIAGNOSIS — E1144 Type 2 diabetes mellitus with diabetic amyotrophy: Secondary | ICD-10-CM | POA: Diagnosis not present
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
## 2023-07-26 ENCOUNTER — Ambulatory Visit: Payer: PRIVATE HEALTH INSURANCE | Admitting: Physical Therapy

## 2023-09-06 ENCOUNTER — Other Ambulatory Visit: Payer: Self-pay

## 2023-09-06 ENCOUNTER — Encounter: Payer: Self-pay | Admitting: Internal Medicine

## 2023-09-06 ENCOUNTER — Ambulatory Visit (INDEPENDENT_AMBULATORY_CARE_PROVIDER_SITE_OTHER): Payer: BLUE CROSS/BLUE SHIELD | Admitting: Internal Medicine

## 2023-09-06 VITALS — BP 112/78 | HR 107 | Temp 98.9°F | Resp 16 | Wt 230.4 lb

## 2023-09-06 DIAGNOSIS — M869 Osteomyelitis, unspecified: Secondary | ICD-10-CM | POA: Diagnosis not present

## 2023-09-06 DIAGNOSIS — A429 Actinomycosis, unspecified: Secondary | ICD-10-CM
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
## 2023-09-06 NOTE — Patient Instructions (Signed)
Continue amoxicillin 3 times a day  See me in around 8-10 weeks (just before christmas)  Referral to new pt placed

## 2023-10-15 DIAGNOSIS — Z0271 Encounter for disability determination: Secondary | ICD-10-CM

## 2023-11-15 ENCOUNTER — Ambulatory Visit: Payer: PRIVATE HEALTH INSURANCE | Admitting: Internal Medicine

## 2024-02-07 ENCOUNTER — Encounter: Payer: Self-pay | Admitting: Adult Health

## 2024-02-07 ENCOUNTER — Ambulatory Visit: Payer: Self-pay | Admitting: Adult Health

## 2024-02-07 VITALS — BP 107/76 | HR 101 | Ht 70.0 in | Wt 252.0 lb

## 2024-02-07 DIAGNOSIS — Z7984 Long term (current) use of oral hypoglycemic drugs: Secondary | ICD-10-CM

## 2024-02-07 DIAGNOSIS — E1144 Type 2 diabetes mellitus with diabetic amyotrophy: Secondary | ICD-10-CM
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
## 2024-02-07 NOTE — Patient Instructions (Signed)
Your Plan:  Continue gabapentin 300 mg three times a day If your symptoms worsen or you develop new symptoms please let us know.    Thank you for coming to see us at [facility]. I hope we have been able to provide you high quality care today.  You may receive a patient satisfaction survey over the next few weeks. We would appreciate your feedback and comments so that we may continue to improve ourselves and the health of our patients.  

## 2024-02-08 ENCOUNTER — Telehealth: Payer: Self-pay | Admitting: Adult Health

## 2024-02-08 DIAGNOSIS — R202 Paresthesia of skin: Secondary | ICD-10-CM

## 2024-02-08 NOTE — Telephone Encounter (Signed)
 Please call patient and see if he would like to do occupational therapy first for possible ulnar neuropathy in the right hand.  Advised that I did discuss with Dr. Lucia Gaskins and she recommends OT before we do nerve conduction studies

## 2024-02-08 NOTE — Telephone Encounter (Signed)
-----   Message from Anson Fret sent at 02/08/2024 10:02 AM EST ----- Could we send him to OT first to see if they can help with possible ulnar neuropathy? Seems like he just mentioned it and it wasn;t something significant or worsening? ----- Message ----- From: Butch Penny, NP Sent: 02/07/2024  10:43 AM EST To: Anson Fret, MD  Patient is having numbness in the right fourth and fifth digit.-Possible ulnar neuropathy or progression of diabetic neuropathy?  Should I order nerve conduction EMG of the upper extremities?

## 2024-02-11 NOTE — Telephone Encounter (Signed)
 I called pt and he is ok to do OT first and see how he does.

## 2024-02-11 NOTE — Addendum Note (Signed)
 Addended by: Guy Begin on: 02/11/2024 01:41 PM   Modules accepted: Orders

## 2024-02-11 NOTE — Addendum Note (Signed)
 Addended by: Enedina Finner on: 02/11/2024 03:32 PM   Modules accepted: Orders

## 2024-02-18 ENCOUNTER — Encounter: Admitting: Occupational Therapy

## 2024-03-06 ENCOUNTER — Encounter: Payer: Self-pay | Admitting: Internal Medicine

## 2024-03-06 ENCOUNTER — Other Ambulatory Visit: Payer: Self-pay

## 2024-03-06 ENCOUNTER — Ambulatory Visit (INDEPENDENT_AMBULATORY_CARE_PROVIDER_SITE_OTHER): Payer: Self-pay | Admitting: Internal Medicine

## 2024-03-06 VITALS — BP 119/79 | HR 81 | Temp 98.0°F | Wt 252.6 lb

## 2024-03-06 DIAGNOSIS — A429 Actinomycosis, unspecified: Secondary | ICD-10-CM | POA: Diagnosis not present

## 2024-03-06 NOTE — Patient Instructions (Addendum)
 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 If you have a foot infection in the next 3 months after we stop antibiotics and culture shows actinomyces again, that means that you likely have relapse and will need another extended abx course. We would of course need to have culture if you have another foot infection, to see if this is new or relapsed infection   Otherwise continue to have routine good foot care  [facility] at [facility] is a good foot care center if you are interested
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
## 2024-03-07 LAB — CBC WITH DIFFERENTIAL/PLATELET
Absolute Lymphocytes: 3244 {cells}/uL (ref 850–3900)
Absolute Monocytes: 823 {cells}/uL (ref 200–950)
Basophils Absolute: 137 {cells}/uL (ref 0–200)
Basophils Relative: 1.4 %
Eosinophils Absolute: 265 {cells}/uL (ref 15–500)
Eosinophils Relative: 2.7 %
HCT: 43.2 % (ref 38.5–50.0)
Hemoglobin: 14.7 g/dL (ref 13.2–17.1)
MCH: 30.5 pg (ref 27.0–33.0)
MCHC: 34 g/dL (ref 32.0–36.0)
MCV: 89.6 fL (ref 80.0–100.0)
MPV: 12.9 fL — ABNORMAL HIGH (ref 7.5–12.5)
Monocytes Relative: 8.4 %
Neutro Abs: 5331 {cells}/uL (ref 1500–7800)
Neutrophils Relative %: 54.4 %
Platelets: 259 10*3/uL (ref 140–400)
RBC: 4.82 10*6/uL (ref 4.20–5.80)
RDW: 13.7 % (ref 11.0–15.0)
Total Lymphocyte: 33.1 %
WBC: 9.8 10*3/uL (ref 3.8–10.8)

## 2024-03-07 LAB — COMPLETE METABOLIC PANEL WITHOUT GFR
AG Ratio: 1.5 (calc) (ref 1.0–2.5)
ALT: 7 U/L — ABNORMAL LOW (ref 9–46)
AST: 9 U/L — ABNORMAL LOW (ref 10–35)
Albumin: 4 g/dL (ref 3.6–5.1)
Alkaline phosphatase (APISO): 50 U/L (ref 35–144)
BUN/Creatinine Ratio: 13 (calc) (ref 6–22)
BUN: 7 mg/dL (ref 7–25)
CO2: 23 mmol/L (ref 20–32)
Calcium: 8.8 mg/dL (ref 8.6–10.3)
Chloride: 106 mmol/L (ref 98–110)
Creat: 0.53 mg/dL — ABNORMAL LOW (ref 0.70–1.35)
Globulin: 2.7 g/dL (ref 1.9–3.7)
Glucose, Bld: 93 mg/dL (ref 65–99)
Potassium: 4.1 mmol/L (ref 3.5–5.3)
Sodium: 138 mmol/L (ref 135–146)
Total Bilirubin: 0.6 mg/dL (ref 0.2–1.2)
Total Protein: 6.7 g/dL (ref 6.1–8.1)

## 2024-03-18 LAB — LAB REPORT - SCANNED
A1c: 5.9
Albumin, Urine POC: 8.9
Creatinine, POC: 207.8 mg/dL
EGFR: 0.6
Microalb Creat Ratio: 4

## 2024-04-07 ENCOUNTER — Other Ambulatory Visit: Payer: Self-pay | Admitting: Neurology

## 2024-08-19 ENCOUNTER — Ambulatory Visit: Payer: BLUE CROSS/BLUE SHIELD | Admitting: Neurology
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
## 2024-09-18 ENCOUNTER — Telehealth: Payer: Self-pay | Admitting: Neurology

## 2024-09-18 NOTE — Telephone Encounter (Signed)
 Called and left voicemail for patient to reschedule appointment on 01/06/25 with Dr Ines.  If patient calls back, they can be rescheduled with Dr. Vear

## 2025-01-06 ENCOUNTER — Ambulatory Visit: Admitting: Neurology
